# Patient Record
Sex: Male | Born: 1950 | Race: Black or African American | Hispanic: No | Marital: Married | Smoking: Never smoker
Health system: Southern US, Community
[De-identification: ages and names within clinical notes are randomized; demographics above are authoritative.]

## PROBLEM LIST (undated history)

## (undated) DIAGNOSIS — M1990 Unspecified osteoarthritis, unspecified site: Secondary | ICD-10-CM

## (undated) DIAGNOSIS — Z98811 Dental restoration status: Secondary | ICD-10-CM

## (undated) DIAGNOSIS — F329 Major depressive disorder, single episode, unspecified: Secondary | ICD-10-CM

## (undated) DIAGNOSIS — N400 Enlarged prostate without lower urinary tract symptoms: Secondary | ICD-10-CM

## (undated) DIAGNOSIS — N189 Chronic kidney disease, unspecified: Secondary | ICD-10-CM

## (undated) DIAGNOSIS — K4090 Unilateral inguinal hernia, without obstruction or gangrene, not specified as recurrent: Secondary | ICD-10-CM

## (undated) DIAGNOSIS — F32A Depression, unspecified: Secondary | ICD-10-CM

## (undated) DIAGNOSIS — I10 Essential (primary) hypertension: Secondary | ICD-10-CM

## (undated) HISTORY — PX: HERNIA REPAIR: SHX51

## (undated) HISTORY — DX: Chronic kidney disease, unspecified: N18.9

## (undated) HISTORY — PX: JOINT REPLACEMENT: SHX530

## (undated) HISTORY — PX: APPENDECTOMY: SHX54

---

## 1994-05-16 HISTORY — PX: ULNAR NERVE REPAIR: SHX2594

## 1999-05-17 HISTORY — PX: SHOULDER ARTHROSCOPY WITH ROTATOR CUFF REPAIR: SHX5685

## 1999-08-01 ENCOUNTER — Emergency Department (HOSPITAL_COMMUNITY): Admission: EM | Admit: 1999-08-01 | Discharge: 1999-08-01 | Payer: Self-pay | Admitting: *Deleted

## 1999-08-03 ENCOUNTER — Encounter: Admission: RE | Admit: 1999-08-03 | Discharge: 1999-08-03 | Payer: Self-pay | Admitting: Family Medicine

## 1999-08-03 ENCOUNTER — Encounter: Payer: Self-pay | Admitting: Family Medicine

## 1999-09-08 ENCOUNTER — Emergency Department (HOSPITAL_COMMUNITY): Admission: EM | Admit: 1999-09-08 | Discharge: 1999-09-09 | Payer: Self-pay | Admitting: Emergency Medicine

## 2000-05-02 ENCOUNTER — Encounter: Admission: RE | Admit: 2000-05-02 | Discharge: 2000-05-02 | Payer: Self-pay | Admitting: Orthopedic Surgery

## 2000-05-02 ENCOUNTER — Encounter: Payer: Self-pay | Admitting: Orthopedic Surgery

## 2000-07-03 ENCOUNTER — Encounter
Admission: RE | Admit: 2000-07-03 | Discharge: 2000-07-03 | Payer: Self-pay | Admitting: Physical Medicine and Rehabilitation

## 2000-07-03 ENCOUNTER — Encounter: Payer: Self-pay | Admitting: Physical Medicine and Rehabilitation

## 2000-08-15 ENCOUNTER — Observation Stay (HOSPITAL_COMMUNITY): Admission: RE | Admit: 2000-08-15 | Discharge: 2000-08-16 | Payer: Self-pay | Admitting: Orthopedic Surgery

## 2003-05-22 ENCOUNTER — Encounter: Admission: RE | Admit: 2003-05-22 | Discharge: 2003-05-22 | Payer: Self-pay | Admitting: Family Medicine

## 2003-06-09 ENCOUNTER — Encounter (INDEPENDENT_AMBULATORY_CARE_PROVIDER_SITE_OTHER): Payer: Self-pay | Admitting: Specialist

## 2003-06-09 ENCOUNTER — Ambulatory Visit (HOSPITAL_COMMUNITY): Admission: RE | Admit: 2003-06-09 | Discharge: 2003-06-09 | Payer: Self-pay | Admitting: Gastroenterology

## 2008-05-24 ENCOUNTER — Emergency Department (HOSPITAL_COMMUNITY): Admission: EM | Admit: 2008-05-24 | Discharge: 2008-05-24 | Payer: Self-pay | Admitting: Emergency Medicine

## 2010-08-30 LAB — CBC
Platelets: 215 10*3/uL (ref 150–400)
RDW: 13.4 % (ref 11.5–15.5)
WBC: 14.1 10*3/uL — ABNORMAL HIGH (ref 4.0–10.5)

## 2010-08-30 LAB — URINALYSIS, ROUTINE W REFLEX MICROSCOPIC
Glucose, UA: NEGATIVE mg/dL
Leukocytes, UA: NEGATIVE
Nitrite: NEGATIVE
Specific Gravity, Urine: 1.043 — ABNORMAL HIGH (ref 1.005–1.030)
pH: 6 (ref 5.0–8.0)

## 2010-08-30 LAB — COMPREHENSIVE METABOLIC PANEL
ALT: 30 U/L (ref 0–53)
BUN: 9 mg/dL (ref 6–23)
CO2: 25 mEq/L (ref 19–32)
Calcium: 9.1 mg/dL (ref 8.4–10.5)
Creatinine, Ser: 1.18 mg/dL (ref 0.4–1.5)
GFR calc Af Amer: >60 mL/min (ref >60)
GFR calc non Af Amer: >60 mL/min (ref >60)
Glucose, Bld: 97 mg/dL (ref 70–99)
Sodium: 137 mEq/L (ref 135–145)

## 2010-08-30 LAB — URINE MICROSCOPIC-ADD ON

## 2010-08-30 LAB — DIFFERENTIAL
Eosinophils Absolute: 0 10*3/uL (ref 0.0–0.7)
Lymphs Abs: 2.8 10*3/uL (ref 0.7–4.0)
Neutrophils Relative %: 68 % (ref 43–77)

## 2010-08-30 LAB — LIPASE, BLOOD: Lipase: 28 U/L (ref 11–59)

## 2010-10-01 NOTE — Op Note (Signed)
NAME:  MAURICO, PERRELL                          ACCOUNT NO.:  [id number]   MEDICAL RECORD NO.:  [id number]                   PATIENT TYPE:  AMB   LOCATION:  ENDO                                 FACILITY:  [facility]   PHYSICIAN:  Graylin Shiver, M.D.                DATE OF BIRTH:  06-27-1950   DATE OF PROCEDURE:  06/09/2003  DATE OF DISCHARGE:                                 OPERATIVE REPORT   PROCEDURE:  Colonoscopy with biopsy.   INDICATION:  Screening.   Informed consent was obtained after explanation of the risks of bleeding,  infection, and perforation   PREMEDICATIONS:  1. Fentanyl 75 mcg IV.  2. Versed 7 mg IV.   DESCRIPTION OF PROCEDURE:  With the patient in the left lateral decubitus  position, a rectal exam was performed; no masses were felt.  The Olympus  colonoscope was inserted into the rectum and advanced around the colon to  the cecum.  Cecal landmarks were identified.  The cecum and ascending colon  were normal.  The transverse colon was normal.  The descending colon was  normal.  The sigmoid showed some scattered diverticula.  The sigmoid also  showed a small 2 mm sessile polyp which was biopsied off with cold forceps.  The rectum was normal.  He tolerated the procedure well without  complications.   IMPRESSION:  1. Diverticulosis, diagnosis code 562.10.  2. Colon polyp, diagnosis code 211.3.                                               Graylin Shiver, M.D.    Germain Osgood  D:  06/09/2003  T:  06/09/2003  Job:  [id number]   cc:   L. Lupe Carney, M.D.  [address] [place]  [place] [address]  Fax: [phone]

## 2012-01-20 ENCOUNTER — Encounter (INDEPENDENT_AMBULATORY_CARE_PROVIDER_SITE_OTHER): Payer: Self-pay | Admitting: Surgery

## 2012-01-24 ENCOUNTER — Encounter (INDEPENDENT_AMBULATORY_CARE_PROVIDER_SITE_OTHER): Payer: Self-pay | Admitting: Surgery

## 2012-01-24 ENCOUNTER — Ambulatory Visit (INDEPENDENT_AMBULATORY_CARE_PROVIDER_SITE_OTHER): Payer: Commercial Indemnity | Admitting: Surgery

## 2012-01-24 VITALS — BP 139/87 | HR 84 | Temp 97.4°F | Resp 18 | Ht 69.0 in | Wt 187.2 lb

## 2012-01-24 DIAGNOSIS — K4090 Unilateral inguinal hernia, without obstruction or gangrene, not specified as recurrent: Secondary | ICD-10-CM | POA: Insufficient documentation

## 2012-01-24 NOTE — Progress Notes (Signed)
Patient ID: Nicholas Cooke, male   DOB: Oct 22, 1950, 61 y.o.   MRN: [id number]  Chief Complaint  Patient presents with  . Other    possible inguinal hernia    HPI Nicholas Cooke is a 61 y.o. male.   HPIthis is a very nice gentleman referred by Dr. Lupe Carney for evaluation of left groin pain and a possible hernia. The patient noticed discomfort in his groin with exercising. It has gotten much worse over time and he now has a reducible bulge in the left groin. The pain is sharp and severe but has not refer any where else. He has had no obstructive symptoms. He has never noticed a bulge in the right groin or umbilicus. He is otherwise without complaints  Past Medical History  Diagnosis Date  . Sympathetic reflex dystrophy     left foot  . Diverticulosis     Past Surgical History  Procedure Date  . Appendectomy   . Knee surgery   . Shoulder surgery     History reviewed. No pertinent family history.  Social History History  Substance Use Topics  . Smoking status: Never Smoker   . Smokeless tobacco: Not on file  . Alcohol Use: No    No Known Allergies  Current Outpatient Prescriptions  Medication Sig Dispense Refill  . finasteride (PROSCAR) 5 MG tablet Take 5 mg by mouth daily.      . fish oil-omega-3 fatty acids 1000 MG capsule Take 2 g by mouth daily.      . Multiple Vitamins-Minerals (VISION VITAMINS PO) Take by mouth.      . sildenafil (VIAGRA) 50 MG tablet Take 50 mg by mouth daily as needed.        Review of Systems Review of Systems  Constitutional: Negative for fever, chills and unexpected weight change.  HENT: Negative for hearing loss, congestion, sore throat, trouble swallowing and voice change.   Eyes: Negative for visual disturbance.  Respiratory: Negative for cough and wheezing.   Cardiovascular: Negative for chest pain, palpitations and leg swelling.  Gastrointestinal: Positive for abdominal pain. Negative for nausea, vomiting, diarrhea,  constipation, blood in stool, abdominal distention, anal bleeding and rectal pain.  Genitourinary: Negative for hematuria and difficulty urinating.  Musculoskeletal: Negative for arthralgias.  Skin: Negative for rash and wound.  Neurological: Negative for seizures, syncope, weakness and headaches.  Hematological: Negative for adenopathy. Does not bruise/bleed easily.  Psychiatric/Behavioral: Negative for confusion.    Blood pressure 139/87, pulse 84, temperature 97.4 F (36.3 C), temperature source Temporal, resp. rate 18, height 5\' 9"  (1.753 m), weight 187 lb 3.2 oz (84.913 kg).  Physical Exam Physical Exam  Constitutional: He is oriented to person, place, and time. He appears well-developed and well-nourished. No distress.  HENT:  Head: Normocephalic and atraumatic.  Right Ear: External ear normal.  Left Ear: External ear normal.  Nose: Nose normal.  Mouth/Throat: Oropharynx is clear and moist. No oropharyngeal exudate.  Eyes: Conjunctivae are normal. Pupils are equal, round, and reactive to light. Right eye exhibits no discharge. Left eye exhibits no discharge. No scleral icterus.  Neck: Normal range of motion. Neck supple. No tracheal deviation present. No thyromegaly present.  Cardiovascular: Normal rate, regular rhythm, normal heart sounds and intact distal pulses.   No murmur heard. Pulmonary/Chest: Effort normal and breath sounds normal. No respiratory distress. He has no wheezes. He has no rales.  Abdominal: Soft. Bowel sounds are normal. There is tenderness.       There is a reducible  left inguinal hernia. This is tender on exam. There is no evidence of right inguinal hernia or umbilical hernia  Musculoskeletal: Normal range of motion. He exhibits no edema and no tenderness.       There is a large scar on his right leg from previous knee surgery  Lymphadenopathy:    He has no cervical adenopathy.  Neurological: He is alert and oriented to person, place, and time.  Skin:  Skin is warm and dry. No rash noted. He is not diaphoretic. No erythema.  Psychiatric: His behavior is normal. Judgment normal.    Data Reviewed   Assessment    Left inguinal hernia    Plan    I discussed the diagnosis with him. I discussed repair with mesh. I discussed with the laparoscopic and open techniques. I discussed the risks of surgery which include but not limited to bleeding, infection, injury to any structures, nerve entrapment, chronic pain, recurrence, etc. He understands and wishes to proceed with open repair which will be scheduled. Likelihood of success is good       Jazzmyne Rasnick A 01/24/2012, 10:22 AM

## 2012-02-13 ENCOUNTER — Encounter (HOSPITAL_BASED_OUTPATIENT_CLINIC_OR_DEPARTMENT_OTHER): Payer: Self-pay | Admitting: *Deleted

## 2012-02-14 MED ORDER — THROMBIN 20000 UNITS EX SOLR
CUTANEOUS | Status: AC
  Filled 2012-02-14: qty 20000

## 2012-02-14 MED ORDER — LIDOCAINE HCL (PF) 1 % IJ SOLN
INTRAMUSCULAR | Status: AC
  Filled 2012-02-14: qty 30

## 2012-02-15 NOTE — H&P (Signed)
Patient ID: Nicholas Cooke, male DOB: [birth year], 61 y.o. MRN: [id number]  Chief Complaint   Patient presents with   .  Other     possible inguinal hernia    HPI  Nicholas Cooke is a 61 y.o. male.  HPIthis is a very nice gentleman referred by Dr. Lupe Carney for evaluation of left groin pain and a possible hernia. The patient noticed discomfort in his groin with exercising. It has gotten much worse over time and he now has a reducible bulge in the left groin. The pain is sharp and severe but has not refer any where else. He has had no obstructive symptoms. He has never noticed a bulge in the right groin or umbilicus. He is otherwise without complaints  Past Medical History   Diagnosis  Date   .  Sympathetic reflex dystrophy      left foot   .  Diverticulosis     Past Surgical History   Procedure  Date   .  Appendectomy    .  Knee surgery    .  Shoulder surgery     History reviewed. No pertinent family history.  Social History  History   Substance Use Topics   .  Smoking status:  Never Smoker   .  Smokeless tobacco:  Not on file   .  Alcohol Use:  No    No Known Allergies  Current Outpatient Prescriptions   Medication  Sig  Dispense  Refill   .  finasteride (PROSCAR) 5 MG tablet  Take 5 mg by mouth daily.     .  fish oil-omega-3 fatty acids 1000 MG capsule  Take 2 g by mouth daily.     .  Multiple Vitamins-Minerals (VISION VITAMINS PO)  Take by mouth.     .  sildenafil (VIAGRA) 50 MG tablet  Take 50 mg by mouth daily as needed.      Review of Systems  Review of Systems  Constitutional: Negative for fever, chills and unexpected weight change.  HENT: Negative for hearing loss, congestion, sore throat, trouble swallowing and voice change.  Eyes: Negative for visual disturbance.  Respiratory: Negative for cough and wheezing.  Cardiovascular: Negative for chest pain, palpitations and leg swelling.  Gastrointestinal: Positive for abdominal pain. Negative for nausea,  vomiting, diarrhea, constipation, blood in stool, abdominal distention, anal bleeding and rectal pain.  Genitourinary: Negative for hematuria and difficulty urinating.  Musculoskeletal: Negative for arthralgias.  Skin: Negative for rash and wound.  Neurological: Negative for seizures, syncope, weakness and headaches.  Hematological: Negative for adenopathy. Does not bruise/bleed easily.  Psychiatric/Behavioral: Negative for confusion.   Blood pressure 139/87, pulse 84, temperature 97.4 F (36.3 C), temperature source Temporal, resp. rate 18, height 5\' 9"  (1.753 m), weight 187 lb 3.2 oz (84.913 kg).  Physical Exam  Physical Exam  Constitutional: He is oriented to person, place, and time. He appears well-developed and well-nourished. No distress.  HENT:  Head: Normocephalic and atraumatic.  Right Ear: External ear normal.  Left Ear: External ear normal.  Nose: Nose normal.  Mouth/Throat: Oropharynx is clear and moist. No oropharyngeal exudate.  Eyes: Conjunctivae are normal. Pupils are equal, round, and reactive to light. Right eye exhibits no discharge. Left eye exhibits no discharge. No scleral icterus.  Neck: Normal range of motion. Neck supple. No tracheal deviation present. No thyromegaly present.  Cardiovascular: Normal rate, regular rhythm, normal heart sounds and intact distal pulses.  No murmur heard.  Pulmonary/Chest: Effort normal and breath sounds  normal. No respiratory distress. He has no wheezes. He has no rales.  Abdominal: Soft. Bowel sounds are normal. There is tenderness.  There is a reducible left inguinal hernia. This is tender on exam. There is no evidence of right inguinal hernia or umbilical hernia  Musculoskeletal: Normal range of motion. He exhibits no edema and no tenderness.  There is a large scar on his right leg from previous knee surgery  Lymphadenopathy:  He has no cervical adenopathy.  Neurological: He is alert and oriented to person, place, and time.    Skin: Skin is warm and dry. No rash noted. He is not diaphoretic. No erythema.  Psychiatric: His behavior is normal. Judgment normal.   Data Reviewed  Assessment   Left inguinal hernia   Plan   I discussed the diagnosis with him. I discussed repair with mesh. I discussed with the laparoscopic and open techniques. I discussed the risks of surgery which include but not limited to bleeding, infection, injury to any structures, nerve entrapment, chronic pain, recurrence, etc. He understands and wishes to proceed with open repair which will be scheduled. Likelihood of success is good   Avantae Bither A

## 2012-02-16 ENCOUNTER — Encounter (HOSPITAL_BASED_OUTPATIENT_CLINIC_OR_DEPARTMENT_OTHER): Payer: Self-pay | Admitting: Anesthesiology

## 2012-02-16 ENCOUNTER — Ambulatory Visit (HOSPITAL_BASED_OUTPATIENT_CLINIC_OR_DEPARTMENT_OTHER)
Admission: RE | Admit: 2012-02-16 | Discharge: 2012-02-16 | Disposition: A | Discharge status: Home / Self Care | Payer: Commercial Indemnity | Source: Ambulatory Visit | Attending: Surgery | Admitting: Surgery

## 2012-02-16 ENCOUNTER — Encounter (HOSPITAL_BASED_OUTPATIENT_CLINIC_OR_DEPARTMENT_OTHER)
Admission: RE | Disposition: A | Discharge status: Home / Self Care | Payer: Self-pay | Source: Ambulatory Visit | Attending: Surgery

## 2012-02-16 ENCOUNTER — Encounter (HOSPITAL_BASED_OUTPATIENT_CLINIC_OR_DEPARTMENT_OTHER): Payer: Self-pay | Admitting: *Deleted

## 2012-02-16 ENCOUNTER — Ambulatory Visit (HOSPITAL_BASED_OUTPATIENT_CLINIC_OR_DEPARTMENT_OTHER): Payer: Commercial Indemnity | Admitting: Anesthesiology

## 2012-02-16 DIAGNOSIS — K4090 Unilateral inguinal hernia, without obstruction or gangrene, not specified as recurrent: Secondary | ICD-10-CM

## 2012-02-16 HISTORY — PX: INGUINAL HERNIA REPAIR: SHX194

## 2012-02-16 SURGERY — REPAIR, HERNIA, INGUINAL, ADULT
Anesthesia: General | Site: Groin | Laterality: Left | Wound class: Clean

## 2012-02-16 MED ORDER — ONDANSETRON HCL 4 MG/2ML IJ SOLN: 4.0000 mg | Freq: Four times a day (QID) | INTRAMUSCULAR | Status: DC | PRN

## 2012-02-16 MED ORDER — SODIUM CHLORIDE 0.9 % IJ SOLN: 3.0000 mL | INTRAMUSCULAR | Status: DC | PRN

## 2012-02-16 MED ORDER — EPHEDRINE SULFATE 50 MG/ML IJ SOLN
INTRAMUSCULAR | Status: DC | PRN
  Administered 2012-02-16: 15 mg via INTRAVENOUS

## 2012-02-16 MED ORDER — MIDAZOLAM HCL 2 MG/2ML IJ SOLN
0.5000 mg | INTRAMUSCULAR | Status: DC | PRN
  Administered 2012-02-16: 2 mg via INTRAVENOUS

## 2012-02-16 MED ORDER — OXYCODONE HCL 5 MG PO TABS: 5.0000 mg | ORAL_TABLET | ORAL | Status: DC | PRN

## 2012-02-16 MED ORDER — BUPIVACAINE-EPINEPHRINE PF 0.5-1:200000 % IJ SOLN
INTRAMUSCULAR | Status: DC | PRN
  Administered 2012-02-16: 30 mL

## 2012-02-16 MED ORDER — PROPOFOL 10 MG/ML IV BOLUS
INTRAVENOUS | Status: DC | PRN
  Administered 2012-02-16: 200 mg via INTRAVENOUS

## 2012-02-16 MED ORDER — OXYCODONE HCL 5 MG PO TABS
5.0000 mg | ORAL_TABLET | Freq: Once | ORAL | Status: AC | PRN
  Administered 2012-02-16: 5 mg via ORAL

## 2012-02-16 MED ORDER — SODIUM CHLORIDE 0.9 % IJ SOLN: 3.0000 mL | Freq: Two times a day (BID) | INTRAMUSCULAR | Status: DC

## 2012-02-16 MED ORDER — LACTATED RINGERS IV SOLN
INTRAVENOUS | Status: DC
  Administered 2012-02-16: 14:00:00 via INTRAVENOUS

## 2012-02-16 MED ORDER — ACETAMINOPHEN 10 MG/ML IV SOLN
1000.0000 mg | Freq: Once | INTRAVENOUS | Status: AC
  Administered 2012-02-16: 1000 mg via INTRAVENOUS

## 2012-02-16 MED ORDER — BUPIVACAINE-EPINEPHRINE 0.5% -1:200000 IJ SOLN
INTRAMUSCULAR | Status: DC | PRN
  Administered 2012-02-16: 30 mL

## 2012-02-16 MED ORDER — FENTANYL CITRATE 0.05 MG/ML IJ SOLN
INTRAMUSCULAR | Status: DC | PRN
  Administered 2012-02-16: 100 ug via INTRAVENOUS

## 2012-02-16 MED ORDER — ONDANSETRON HCL 4 MG/2ML IJ SOLN
INTRAMUSCULAR | Status: DC | PRN
  Administered 2012-02-16: 4 mg via INTRAVENOUS

## 2012-02-16 MED ORDER — FENTANYL CITRATE 0.05 MG/ML IJ SOLN
50.0000 ug | INTRAMUSCULAR | Status: DC | PRN
  Administered 2012-02-16: 100 ug via INTRAVENOUS

## 2012-02-16 MED ORDER — MORPHINE SULFATE 4 MG/ML IJ SOLN: 4.0000 mg | INTRAMUSCULAR | Status: DC | PRN

## 2012-02-16 MED ORDER — OXYCODONE HCL 5 MG/5ML PO SOLN: 5.0000 mg | Freq: Once | ORAL | Status: AC | PRN

## 2012-02-16 MED ORDER — LIDOCAINE HCL (CARDIAC) 20 MG/ML IV SOLN
INTRAVENOUS | Status: DC | PRN
  Administered 2012-02-16: 100 mg via INTRAVENOUS

## 2012-02-16 MED ORDER — SODIUM CHLORIDE 0.9 % IV SOLN: 250.0000 mL | INTRAVENOUS | Status: DC | PRN

## 2012-02-16 MED ORDER — ACETAMINOPHEN 325 MG PO TABS: 650.0000 mg | ORAL_TABLET | ORAL | Status: DC | PRN

## 2012-02-16 MED ORDER — BUPIVACAINE HCL (PF) 0.25 % IJ SOLN: INTRAMUSCULAR | Status: DC | PRN

## 2012-02-16 MED ORDER — TRAMADOL HCL 50 MG PO TABS: 50.0000 mg | ORAL_TABLET | ORAL | Status: DC | PRN

## 2012-02-16 MED ORDER — HYDROMORPHONE HCL PF 1 MG/ML IJ SOLN: 0.2500 mg | INTRAMUSCULAR | Status: DC | PRN

## 2012-02-16 MED ORDER — CEFAZOLIN SODIUM-DEXTROSE 2-3 GM-% IV SOLR
2.0000 g | INTRAVENOUS | Status: AC
  Administered 2012-02-16: 2 g via INTRAVENOUS

## 2012-02-16 MED ORDER — ACETAMINOPHEN 650 MG RE SUPP: 650.0000 mg | RECTAL | Status: DC | PRN

## 2012-02-16 MED ORDER — DEXAMETHASONE SODIUM PHOSPHATE 4 MG/ML IJ SOLN
INTRAMUSCULAR | Status: DC | PRN
  Administered 2012-02-16: 10 mg via INTRAVENOUS

## 2012-02-16 SURGICAL SUPPLY — 47 items
APL SKNCLS STERI-STRIP NONHPOA (GAUZE/BANDAGES/DRESSINGS) ×2
BENZOIN TINCTURE PRP APPL 2/3 (GAUZE/BANDAGES/DRESSINGS) ×3 IMPLANT
BLADE HEX COATED 2.75 (ELECTRODE) ×3 IMPLANT
BLADE SURG 10 STRL SS (BLADE) ×3 IMPLANT
BLADE SURG ROTATE 9660 (MISCELLANEOUS) ×3 IMPLANT
CHLORAPREP W/TINT 26ML (MISCELLANEOUS) ×3 IMPLANT
CLOTH BEACON ORANGE TIMEOUT ST (SAFETY) ×3 IMPLANT
COVER MAYO STAND STRL (DRAPES) ×3 IMPLANT
COVER TABLE BACK 60X90 (DRAPES) ×3 IMPLANT
DECANTER SPIKE VIAL GLASS SM (MISCELLANEOUS) ×3 IMPLANT
DRAIN PENROSE 1/2X12 LTX STRL (WOUND CARE) ×3 IMPLANT
DRAPE PED LAPAROTOMY (DRAPES) ×3 IMPLANT
DRAPE UTILITY XL STRL (DRAPES) ×3 IMPLANT
DRSG TEGADERM 4X4.75 (GAUZE/BANDAGES/DRESSINGS) ×3 IMPLANT
ELECT REM PT RETURN 9FT ADLT (ELECTROSURGICAL) ×3
ELECTRODE REM PT RTRN 9FT ADLT (ELECTROSURGICAL) ×2 IMPLANT
GAUZE SPONGE 4X4 12PLY STRL LF (GAUZE/BANDAGES/DRESSINGS) ×3 IMPLANT
GLOVE BIOGEL PI IND STRL 7.0 (GLOVE) IMPLANT
GLOVE BIOGEL PI INDICATOR 7.0 (GLOVE) ×2
GLOVE ECLIPSE 6.5 STRL STRAW (GLOVE) ×1 IMPLANT
GLOVE SURG SIGNA 7.5 PF LTX (GLOVE) ×3 IMPLANT
GOWN PREVENTION PLUS XLARGE (GOWN DISPOSABLE) ×4 IMPLANT
GOWN PREVENTION PLUS XXLARGE (GOWN DISPOSABLE) ×3 IMPLANT
MESH PARIETEX PROGRIP LEFT (Mesh General) ×1 IMPLANT
NDL HYPO 25X1 1.5 SAFETY (NEEDLE) ×2 IMPLANT
NEEDLE HYPO 22GX1.5 SAFETY (NEEDLE) ×3 IMPLANT
NEEDLE HYPO 25X1 1.5 SAFETY (NEEDLE) ×3 IMPLANT
NS IRRIG 1000ML POUR BTL (IV SOLUTION) IMPLANT
PACK BASIN DAY SURGERY FS (CUSTOM PROCEDURE TRAY) ×3 IMPLANT
PENCIL BUTTON HOLSTER BLD 10FT (ELECTRODE) ×3 IMPLANT
SLEEVE SCD COMPRESS KNEE MED (MISCELLANEOUS) ×1 IMPLANT
SPONGE INTESTINAL PEANUT (DISPOSABLE) ×1 IMPLANT
SPONGE LAP 4X18 X RAY DECT (DISPOSABLE) ×3 IMPLANT
STRIP CLOSURE SKIN 1/2X4 (GAUZE/BANDAGES/DRESSINGS) ×3 IMPLANT
SUT MNCRL AB 4-0 PS2 18 (SUTURE) ×3 IMPLANT
SUT SILK 2 0 SH (SUTURE) ×1 IMPLANT
SUT SURG 0 T 19/GS 22 1969 62 (SUTURE) IMPLANT
SUT VIC AB 2-0 CT1 27 (SUTURE) ×3
SUT VIC AB 2-0 CT1 TAPERPNT 27 (SUTURE) ×2 IMPLANT
SUT VIC AB 3-0 CT1 27 (SUTURE) ×3
SUT VIC AB 3-0 CT1 27XBRD (SUTURE) ×2 IMPLANT
SUT VICRYL AB 3 0 TIES (SUTURE) ×3 IMPLANT
SYR BULB 3OZ (MISCELLANEOUS) ×1 IMPLANT
SYR CONTROL 10ML LL (SYRINGE) ×3 IMPLANT
TOWEL OR 17X24 6PK STRL BLUE (TOWEL DISPOSABLE) ×6 IMPLANT
TOWEL OR NON WOVEN STRL DISP B (DISPOSABLE) ×3 IMPLANT
WATER STERILE IRR 1000ML POUR (IV SOLUTION) ×3 IMPLANT

## 2012-02-16 NOTE — Anesthesia Postprocedure Evaluation (Signed)
  Anesthesia Post-op Note  Patient: Nicholas Cooke  Procedure(s) Performed: Procedure(s) (LRB) with comments: HERNIA REPAIR INGUINAL ADULT (Left) - left inguinal hernia repair with mesh INSERTION OF MESH (Left)  Patient Location: PACU  Anesthesia Type: GA combined with regional for post-op pain  Level of Consciousness: awake and alert   Airway and Oxygen Therapy: Patient Spontanous Breathing  Post-op Pain: none  Post-op Assessment: Post-op Vital signs reviewed, Patient's Cardiovascular Status Stable, Respiratory Function Stable, Patent Airway and No signs of Nausea or vomiting  Post-op Vital Signs: Reviewed and stable  Complications: No apparent anesthesia complications

## 2012-02-16 NOTE — Anesthesia Preprocedure Evaluation (Signed)
Anesthesia Evaluation  Patient identified by MRN, date of birth, ID band Patient awake    Reviewed: Allergy & Precautions, H&P , NPO status , Patient's Chart, lab work & pertinent test results  Airway Mallampati: I TM Distance: >3 FB Neck ROM: Full    Dental No notable dental hx. (+) Teeth Intact and Dental Advisory Given   Pulmonary neg pulmonary ROS,  breath sounds clear to auscultation  Pulmonary exam normal       Cardiovascular negative cardio ROS  Rhythm:Regular Rate:Normal     Neuro/Psych negative neurological ROS  negative psych ROS   GI/Hepatic negative GI ROS, Neg liver ROS,   Endo/Other  negative endocrine ROS  Renal/GU negative Renal ROS  negative genitourinary   Musculoskeletal   Abdominal   Peds  Hematology negative hematology ROS (+)   Anesthesia Other Findings   Reproductive/Obstetrics negative OB ROS                           Anesthesia Physical Anesthesia Plan  ASA: I  Anesthesia Plan: General   Post-op Pain Management: MAC Combined w/ Regional for Post-op pain   Induction: Intravenous  Airway Management Planned: LMA  Additional Equipment:   Intra-op Plan:   Post-operative Plan: Extubation in OR  Informed Consent: I have reviewed the patients History and Physical, chart, labs and discussed the procedure including the risks, benefits and alternatives for the proposed anesthesia with the patient or authorized representative who has indicated his/her understanding and acceptance.   Dental advisory given  Plan Discussed with: CRNA  Anesthesia Plan Comments:         Anesthesia Quick Evaluation

## 2012-02-16 NOTE — Progress Notes (Signed)
Assisted Dr. Fitzgerald with left, ultrasound guided, transabdominal plane block. Side rails up, monitors on throughout procedure. See vital signs in flow sheet. Tolerated Procedure well. 

## 2012-02-16 NOTE — Interval H&P Note (Signed)
History and Physical Interval Note:  No change in H and P  02/16/2012 1:42 PM  Nicholas Cooke  has presented today for surgery, with the diagnosis of left inguinal hernia  The various methods of treatment have been discussed with the patient and family. After consideration of risks, benefits and other options for treatment, the patient has consented to  Procedure(s) (LRB) with comments: HERNIA REPAIR INGUINAL ADULT (Left) - left inguinal hernia repair with mesh INSERTION OF MESH (Left) as a surgical intervention .  The patient's history has been reviewed, patient examined, no change in status, stable for surgery.  I have reviewed the patient's chart and labs.  Questions were answered to the patient's satisfaction.     [name] A

## 2012-02-16 NOTE — Anesthesia Procedure Notes (Addendum)
Procedure Name: LMA Insertion Date/Time: 02/16/2012 2:29 PM Performed by: Verlan Friends Pre-anesthesia Checklist: Patient identified, Emergency Drugs available, Suction available, Patient being monitored and Timeout performed Patient Re-evaluated:Patient Re-evaluated prior to inductionOxygen Delivery Method: Circle System Utilized Preoxygenation: Pre-oxygenation with 100% oxygen Intubation Type: IV induction Ventilation: Mask ventilation without difficulty LMA: LMA inserted LMA Size: 5.0 Number of attempts: 1 Airway Equipment and Method: bite block Placement Confirmation: positive ETCO2 Tube secured with: Tape Dental Injury: Teeth and Oropharynx as per pre-operative assessment    Anesthesia Regional Block:  TAP block  Pre-Anesthetic Checklist: ,, timeout performed, Correct Patient, Correct Site, Correct Laterality, Correct Procedure, Correct Position, site marked, Risks and benefits discussed, pre-op evaluation, post-op pain management  Laterality: Left  Prep: chloraprep       Needles:   Needle Type: Echogenic Stimulator Needle      Needle Gauge: 21 G    Additional Needles:  Procedures: ultrasound guided TAP block Narrative:  Start time: 02/16/2012 1:56 PM End time: 02/16/2012 2:11 PM Injection made incrementally with aspirations every 5 mL. Anesthesiologist: Chrystina Naff,MD

## 2012-02-16 NOTE — Op Note (Signed)
HERNIA REPAIR INGUINAL ADULT, INSERTION OF MESH  Procedure Note  Nicholas Cooke 02/16/2012   Pre-op Diagnosis: left inguinal hernia     Post-op Diagnosis: same  Procedure(s): LEFT INGUINAL HERNIA REPAIR WITH MESH (PROGRIP MESH)  Surgeon(s): Shelly Rubenstein, MD  Anesthesia: General  Staff:  Randalyn Rhea, RN - Scrub Person Amy Adela Lank, RN - Circulator  Estimated Blood Loss: Minimal               Procedure: The patient was brought to the operating room and identified as the correct patient. He was placed supine on the operating room table and general anesthesia was induced. His left lower quadrant and groin were then prepped and draped in the usual sterile fashion. I anesthetized the skin with Marcaine. I then made a longitudinal incision with the scalpel. I took this down through Scarpa's fascia with the electrocautery. The external oblique fascia was then opened toward the internal and external rings. The testicular cord structures identified and controlled with a Penrose drain. I separated an indirect hernia sac from the cord structures. There was omentum in the sac which I reduced back into the abdominal cavity. I tied off the base of the sac with a 2-0 silk suture. I then excised the redundant sac. A piece of Prolene Progrip mesh was brought to the field. I placed around the cord structures and secured it to the inguinal floor. I then sutured it to the pubic tubercle with a 2-0 Vicryl suture. Good coverage of the inguinal floor appeared to be achieved. I then closed the external oblique fascia over the top of this the running 2-0 Vicryl suture. I anesthetized the fascia further with Marcaine. I closed Scarpa's fascia with interrupted 3-0 Vicryl sutures and closed the skin with a running 4-0 Monocryl. Steri-Strips, gauze, and Tegaderm were then applied. The patient tolerated the procedure well. All the counts were correct at the end of the procedure. The patient was then  extubated in the operating room and taken in a stable condition to the recovery room.          [name]   Date: 02/16/2012  Time: 2:58 PM

## 2012-02-16 NOTE — Transfer of Care (Signed)
Immediate Anesthesia Transfer of Care Note  Patient: Nicholas Cooke  Procedure(s) Performed: Procedure(s) (LRB) with comments: HERNIA REPAIR INGUINAL ADULT (Left) - left inguinal hernia repair with mesh INSERTION OF MESH (Left)  Patient Location: PACU  Anesthesia Type: General  Level of Consciousness: awake  Airway & Oxygen Therapy: Patient Spontanous Breathing and Patient connected to face mask oxygen  Post-op Assessment: Report given to PACU RN and Post -op Vital signs reviewed and stable  Post vital signs: Reviewed and stable  Complications: No apparent anesthesia complications

## 2012-02-17 ENCOUNTER — Encounter (HOSPITAL_BASED_OUTPATIENT_CLINIC_OR_DEPARTMENT_OTHER): Payer: Self-pay | Admitting: Surgery

## 2012-03-06 ENCOUNTER — Encounter (INDEPENDENT_AMBULATORY_CARE_PROVIDER_SITE_OTHER): Payer: Self-pay | Admitting: General Surgery

## 2012-03-06 ENCOUNTER — Encounter (INDEPENDENT_AMBULATORY_CARE_PROVIDER_SITE_OTHER): Payer: Self-pay | Admitting: Surgery

## 2012-03-06 ENCOUNTER — Encounter (INDEPENDENT_AMBULATORY_CARE_PROVIDER_SITE_OTHER): Payer: Self-pay

## 2012-03-06 ENCOUNTER — Ambulatory Visit (INDEPENDENT_AMBULATORY_CARE_PROVIDER_SITE_OTHER): Payer: Commercial Indemnity | Admitting: Surgery

## 2012-03-06 ENCOUNTER — Telehealth (INDEPENDENT_AMBULATORY_CARE_PROVIDER_SITE_OTHER): Payer: Self-pay

## 2012-03-06 VITALS — BP 136/80 | HR 78 | Temp 98.3°F | Resp 18 | Ht 69.0 in | Wt 187.6 lb

## 2012-03-06 DIAGNOSIS — Z09 Encounter for follow-up examination after completed treatment for conditions other than malignant neoplasm: Secondary | ICD-10-CM

## 2012-03-06 NOTE — Progress Notes (Signed)
Subjective:     Patient ID: Nicholas Cooke, male   DOB: 1951-05-15, 61 y.o.   MRN: [id number]  HPI He is here for a postop visit status post left inguinal hernia repair with mesh. He is doing well and has minimal postoperative discomfort  Review of Systems     Objective:   Physical Exam On exam, his incision is healing well and there is no evidence of recurrence    Assessment:     Patient stable status post left inguinal hernia repair with mesh    Plan:     He may return to work in full activities on November 11. I will see him back as needed

## 2012-03-06 NOTE — Telephone Encounter (Signed)
Pt called stating he miss quoted the rtw date at his recent office visit with Dr Magnus Ivan. Pt states his rtw date for 6wks is 04-02-12. Pt requests new rtw note indicating this rtw date. Rtw note done and pt will pick it up 03-07-12.

## 2013-06-13 ENCOUNTER — Emergency Department (HOSPITAL_COMMUNITY)
Admission: EM | Admit: 2013-06-13 | Discharge: 2013-06-13 | Disposition: A | Discharge status: Home / Self Care | Payer: 59 | Attending: Emergency Medicine | Admitting: Emergency Medicine

## 2013-06-13 ENCOUNTER — Encounter (HOSPITAL_COMMUNITY): Payer: Self-pay | Admitting: Emergency Medicine

## 2013-06-13 ENCOUNTER — Emergency Department (HOSPITAL_COMMUNITY): Payer: 59

## 2013-06-13 DIAGNOSIS — R197 Diarrhea, unspecified: Secondary | ICD-10-CM | POA: Insufficient documentation

## 2013-06-13 DIAGNOSIS — Z9089 Acquired absence of other organs: Secondary | ICD-10-CM | POA: Insufficient documentation

## 2013-06-13 DIAGNOSIS — G8190 Hemiplegia, unspecified affecting unspecified side: Secondary | ICD-10-CM

## 2013-06-13 DIAGNOSIS — R61 Generalized hyperhidrosis: Secondary | ICD-10-CM | POA: Insufficient documentation

## 2013-06-13 DIAGNOSIS — R1110 Vomiting, unspecified: Secondary | ICD-10-CM | POA: Insufficient documentation

## 2013-06-13 DIAGNOSIS — R55 Syncope and collapse: Secondary | ICD-10-CM | POA: Insufficient documentation

## 2013-06-13 DIAGNOSIS — Z8719 Personal history of other diseases of the digestive system: Secondary | ICD-10-CM | POA: Insufficient documentation

## 2013-06-13 DIAGNOSIS — R209 Unspecified disturbances of skin sensation: Secondary | ICD-10-CM | POA: Insufficient documentation

## 2013-06-13 DIAGNOSIS — Z79899 Other long term (current) drug therapy: Secondary | ICD-10-CM | POA: Insufficient documentation

## 2013-06-13 LAB — COMPREHENSIVE METABOLIC PANEL
ALK PHOS: 77 U/L (ref 39–117)
ALT: 22 U/L (ref 0–53)
AST: 25 U/L (ref 0–37)
Albumin: 4.1 g/dL (ref 3.5–5.2)
BUN: 16 mg/dL (ref 6–23)
CO2: 21 mEq/L (ref 19–32)
Calcium: 9.4 mg/dL (ref 8.4–10.5)
Chloride: 103 mEq/L (ref 96–112)
Creatinine, Ser: 1.41 mg/dL — ABNORMAL HIGH (ref 0.50–1.35)
GFR calc Af Amer: 61 mL/min — ABNORMAL LOW (ref >90)
GFR calc non Af Amer: 53 mL/min — ABNORMAL LOW (ref >90)
Glucose, Bld: 135 mg/dL — ABNORMAL HIGH (ref 70–99)
POTASSIUM: 4.8 meq/L (ref 3.7–5.3)
SODIUM: 138 meq/L (ref 137–147)
TOTAL PROTEIN: 8.5 g/dL — AB (ref 6.0–8.3)
Total Bilirubin: 1.8 mg/dL — ABNORMAL HIGH (ref 0.3–1.2)

## 2013-06-13 LAB — DIFFERENTIAL
BASOS PCT: 0 % (ref 0–1)
Basophils Absolute: 0 10*3/uL (ref 0.0–0.1)
Eosinophils Absolute: 0.1 10*3/uL (ref 0.0–0.7)
Eosinophils Relative: 0 % (ref 0–5)
Lymphocytes Relative: 20 % (ref 12–46)
Lymphs Abs: 2.3 10*3/uL (ref 0.7–4.0)
Monocytes Absolute: 0.9 10*3/uL (ref 0.1–1.0)
Monocytes Relative: 8 % (ref 3–12)
NEUTROS PCT: 72 % (ref 43–77)
Neutro Abs: 8.2 10*3/uL — ABNORMAL HIGH (ref 1.7–7.7)

## 2013-06-13 LAB — URINE MICROSCOPIC-ADD ON

## 2013-06-13 LAB — URINALYSIS, ROUTINE W REFLEX MICROSCOPIC
Bilirubin Urine: NEGATIVE
GLUCOSE, UA: NEGATIVE mg/dL
Hgb urine dipstick: NEGATIVE
KETONES UR: NEGATIVE mg/dL
Leukocytes, UA: NEGATIVE
Nitrite: NEGATIVE
PROTEIN: 30 mg/dL — AB
Specific Gravity, Urine: 1.022 (ref 1.005–1.030)
Urobilinogen, UA: 0.2 mg/dL (ref 0.0–1.0)
pH: 6 (ref 5.0–8.0)

## 2013-06-13 LAB — POCT I-STAT, CHEM 8
BUN: 18 mg/dL (ref 6–23)
CALCIUM ION: 1.14 mmol/L (ref 1.13–1.30)
CREATININE: 1.5 mg/dL — AB (ref 0.50–1.35)
Chloride: 107 mEq/L (ref 96–112)
Glucose, Bld: 142 mg/dL — ABNORMAL HIGH (ref 70–99)
HCT: 51 % (ref 39.0–52.0)
Hemoglobin: 17.3 g/dL — ABNORMAL HIGH (ref 13.0–17.0)
Potassium: 4.7 mEq/L (ref 3.7–5.3)
Sodium: 138 mEq/L (ref 137–147)
TCO2: 22 mmol/L (ref 0–100)

## 2013-06-13 LAB — RAPID URINE DRUG SCREEN, HOSP PERFORMED
AMPHETAMINES: NOT DETECTED
BENZODIAZEPINES: NOT DETECTED
Barbiturates: NOT DETECTED
Cocaine: NOT DETECTED
Opiates: NOT DETECTED
Tetrahydrocannabinol: NOT DETECTED

## 2013-06-13 LAB — CBC
HCT: 45.7 % (ref 39.0–52.0)
Hemoglobin: 16.1 g/dL (ref 13.0–17.0)
MCH: 29.3 pg (ref 26.0–34.0)
MCHC: 35.2 g/dL (ref 30.0–36.0)
MCV: 83.2 fL (ref 78.0–100.0)
PLATELETS: 220 10*3/uL (ref 150–400)
RBC: 5.49 MIL/uL (ref 4.22–5.81)
RDW: 13.6 % (ref 11.5–15.5)
WBC: 11.5 10*3/uL — ABNORMAL HIGH (ref 4.0–10.5)

## 2013-06-13 LAB — PROTIME-INR
INR: 1.06 (ref 0.00–1.49)
PROTHROMBIN TIME: 13.6 s (ref 11.6–15.2)

## 2013-06-13 LAB — GLUCOSE, CAPILLARY: Glucose-Capillary: 140 mg/dL — ABNORMAL HIGH (ref 70–99)

## 2013-06-13 LAB — APTT: aPTT: 24 seconds (ref 24–37)

## 2013-06-13 LAB — POCT I-STAT TROPONIN I: Troponin i, poc: 0 ng/mL (ref 0.00–0.08)

## 2013-06-13 LAB — ETHANOL: Alcohol, Ethyl (B): <11 mg/dL (ref 0–11)

## 2013-06-13 LAB — TROPONIN I

## 2013-06-13 MED ORDER — DIPHENOXYLATE-ATROPINE 2.5-0.025 MG PO TABS: 1.0000 | ORAL_TABLET | Freq: Four times a day (QID) | ORAL | Status: DC | PRN

## 2013-06-13 MED ORDER — NAPROXEN 500 MG PO TABS: 500.0000 mg | ORAL_TABLET | Freq: Two times a day (BID) | ORAL | Status: DC

## 2013-06-13 NOTE — ED Notes (Signed)
Chem 8 and troponin results given to Dr. Hyacinth MeekerMiller by B. Bing PlumeHaynes, EMT

## 2013-06-13 NOTE — ED Notes (Signed)
Pt arrived to MRI.  

## 2013-06-13 NOTE — ED Notes (Signed)
Pt has significant increase in strength in left arm and leg. Some mild deficit still noted compared to right arm and leg

## 2013-06-13 NOTE — ED Notes (Signed)
Pt ambulated to restroom unassisted with a mildly unsteady gait

## 2013-06-13 NOTE — ED Notes (Signed)
Pt returned from MRI °

## 2013-06-13 NOTE — ED Notes (Signed)
Pt arrived to room D34

## 2013-06-13 NOTE — ED Provider Notes (Signed)
CSN: [id number]     Arrival date & time 06/13/13  0422 History   First MD Initiated Contact with Patient 06/13/13 [phone]     Chief Complaint  Patient presents with  . Code Stroke   (Consider location/radiation/quality/duration/timing/severity/associated sxs/prior Treatment) HPI Comments: 63 year old male, presents with a complaint of left-sided weakness. This started at 3:30 AM while the patient was in the bathroom after having multiple episodes of watery diarrhea since 10:00 last night. He had a syncopal event, fell to the ground and then vomited. He was found to be pale and diaphoretic, he was altered and not answering questions appropriately initially, paramedics stated that his vital signs were relatively normal, about halfway to the hospital he returned to his baseline And at this time complains of left upper and left lower extremity weakness, numbness  and tingling. Nothing seems to make this better or worse, no associated headache, fever, chills, chest pain, shortness of breath.  The history is provided by the patient and the spouse.    Past Medical History  Diagnosis Date  . Diverticulosis    Past Surgical History  Procedure Laterality Date  . Appendectomy    . Knee surgery    . Shoulder surgery    . Inguinal hernia repair  02/16/2012    Procedure: HERNIA REPAIR INGUINAL ADULT;  Surgeon: Shelly Rubenstein, MD;  Location: [facility];  Service: General;  Laterality: Left;  left inguinal hernia repair with mesh   History reviewed. No pertinent family history. History  Substance Use Topics  . Smoking status: Never Smoker   . Smokeless tobacco: Not on file  . Alcohol Use: No    Review of Systems  All other systems reviewed and are negative.    Allergies  Review of patient's allergies indicates no known allergies.  Home Medications   Current Outpatient Rx  Name  Route  Sig  Dispense  Refill  . finasteride (PROSCAR) 5 MG tablet   Oral   Take 5 mg by mouth  daily.         . diphenoxylate-atropine (LOMOTIL) 2.5-0.025 MG per tablet   Oral   Take 1 tablet by mouth 4 (four) times daily as needed for diarrhea or loose stools.   30 tablet   0   . naproxen (NAPROSYN) 500 MG tablet   Oral   Take 1 tablet (500 mg total) by mouth 2 (two) times daily with a meal.   20 tablet   0    BP 140/93  Pulse 89  Resp 16  SpO2 95% Physical Exam  Nursing note and vitals reviewed. Constitutional: He appears well-developed and well-nourished. No distress.  HENT:  Head: Normocephalic and atraumatic.  Mouth/Throat: Oropharynx is clear and moist. No oropharyngeal exudate.  Eyes: Conjunctivae and EOM are normal. Pupils are equal, round, and reactive to light. Right eye exhibits no discharge. Left eye exhibits no discharge. No scleral icterus.  Neck: Normal range of motion. Neck supple. No JVD present. No thyromegaly present.  Cardiovascular: Normal rate, regular rhythm, normal heart sounds and intact distal pulses.  Exam reveals no gallop and no friction rub.   No murmur heard. No carotid bruit  Pulmonary/Chest: Effort normal and breath sounds normal. No respiratory distress. He has no wheezes. He has no rales.  Abdominal: Soft. Bowel sounds are normal. He exhibits no distension and no mass. There is no tenderness.  Musculoskeletal: Normal range of motion. He exhibits no edema and no tenderness.  Lymphadenopathy:    He  has no cervical adenopathy.  Neurological: He is alert.  Left upper and left lower extremity slightly weak but appears to be somewhat effort dependent, finger-nose-finger normal on the right, unable to perform this task on the left. Cranial nerves III through XII appear to be intact, speech is clear and goal-directed  Skin: Skin is warm and dry. No rash noted. No erythema.  Psychiatric: He has a normal mood and affect. His behavior is normal.    ED Course  Procedures (including critical care time) Labs Review Labs Reviewed  CBC -  Abnormal; Notable for the following:    WBC 11.5 (*)    All other components within normal limits  DIFFERENTIAL - Abnormal; Notable for the following:    Neutro Abs 8.2 (*)    All other components within normal limits  COMPREHENSIVE METABOLIC PANEL - Abnormal; Notable for the following:    Glucose, Bld 135 (*)    Creatinine, Ser 1.41 (*)    Total Protein 8.5 (*)    Total Bilirubin 1.8 (*)    GFR calc non Af Amer 53 (*)    GFR calc Af Amer 61 (*)    All other components within normal limits  GLUCOSE, CAPILLARY - Abnormal; Notable for the following:    Glucose-Capillary 140 (*)    All other components within normal limits  POCT I-STAT, CHEM 8 - Abnormal; Notable for the following:    Creatinine, Ser 1.50 (*)    Glucose, Bld 142 (*)    Hemoglobin 17.3 (*)    All other components within normal limits  ETHANOL  PROTIME-INR  APTT  TROPONIN I  URINE RAPID DRUG SCREEN (HOSP PERFORMED)  URINALYSIS, ROUTINE W REFLEX MICROSCOPIC   Imaging Review Ct Head Wo Contrast  06/13/2013   CLINICAL DATA:  Code stroke, weakness  EXAM: CT HEAD WITHOUT CONTRAST  TECHNIQUE: Contiguous axial images were obtained from the base of the skull through the vertex without intravenous contrast.  COMPARISON:  None.  FINDINGS: Maintained gray-white differentiation. Mild subcortical and periventricular white matter hypodensities, a nonspecific finding often seen in the setting of chronic microangiopathic change. No definite CT evidence of an acute infarction. No intraparenchymal hemorrhage, mass, mass effect, or abnormal extra-axial fluid collection. The ventricles, cisterns, and sulci are normal in size, shape, and position.  IMPRESSION: Mild white matter changes as above. No definite CT evidence of an acute intracranial abnormality. MRI recommended if concern for ischemia persists. Discussed via telephone with Dr. Hyacinth MeekerMiller at 4:45 a.m. on 06/13/2013.  7 mm sellar hyperdensity may reflect volume averaging through a normal  vessel. Attention at MRI/MRA to exclude a small aneurysm.   Electronically Signed   By: Jearld LeschAndrew  DelGaizo M.D.   On: 06/13/2013 04:55    ED ECG REPORT  I personally interpreted this EKG   Date: 06/13/2013   Rate: 87  Rhythm: normal sinus rhythm  QRS Axis: normal  Intervals: normal  ST/T Wave abnormalities: normal  Conduction Disutrbances:none  Narrative Interpretation:   Old EKG Reviewed: none available   MDM   1. Hemiplegia, unspecified, affecting nondominant side    Dr. Thad Rangereynolds with neurology has seen the patient, recommends immediate MRI to further characterize any possible neurologic abnormalities. Discussed with radiology regarding CT scan which shows no signs of acute hemorrhage or infarct. Possibly vagal, possibly related to dehydration.  Dr. Thad Rangereynolds requests MRI, MRI shows no signs of acute stroke according to Dr. Thad Rangereynolds and she recommends that the patient be discharged soon as he is able to  ambulate. On repeat exam at approximately 6:00 AM the patient is able to lift both arms without any difficulty or discoordination. Still has slight weakness to the left lower extremity. He has had no further diarrhea, no further syncope, no further lightheadedness and has no complaints when resting in the bed. Laboratory workup shows slight renal insufficiency with a creatinine of 1.4, otherwise no significant findings.  0700 ambulated to bathroom - stable for d/c.  Vida Roller, MD 06/13/13 [phone]

## 2013-06-13 NOTE — ED Notes (Signed)
Pt arrived to CT. 

## 2013-06-13 NOTE — Discharge Instructions (Signed)
Your testing looked really good - there was a slight change in your kidney tests (Cr of 1.4)  - this needs to be rechecked in 2 weeks - drink plenty of fluids and stay hydrated.  Please call your doctor for a followup appointment within 24-48 hours. When you talk to your doctor please let them know that you were seen in the emergency department and have them acquire all of your records so that they can discuss the findings with you and formulate a treatment plan to fully care for your new and ongoing problems.

## 2013-06-13 NOTE — ED Notes (Signed)
Dr. Reynolds at bedside.

## 2013-06-13 NOTE — ED Notes (Signed)
Per EMS: LSN [id number]. Pt reports having a watery bowel movement, after bowel movement pt felt tingling in left arm, stood up then had a syncopal episode, pt woke up and immediately started vomiting. Positive LOC, unknown if pt hit head. Pt had significant left sided deficits after waking up. Pt A&O but reported a "cloudy" sensation in his head, respirations equal and unlabored, skin warm and dry.

## 2013-06-13 NOTE — Consult Note (Signed)
Referring Physician: Hyacinth MeekerMiller    Chief Complaint: Left sided weakness  HPI: Nicholas KellWilliam Cooke is an 63 y.o. male who has been up and down all evening with diarrhea.  Went to go to the bathroom around 3AM. Reports that he felt otherwise well when he  Went to the bathroom. While on the toilet felt numbness go up his left arm, then fell out.  Family heard a thud and found the patient in the bathroom on the floor.  He was unresponsive.  No tonic-clonic activity noted.  When they tried to sit him up his head was drooping to the left.  EMS was called an d when they arrived the patient was more alert but had left sided weakness.  Patient vomited as well.  Patient was brought in as a code stroke at that time.    Date last known well: Date: 06/13/2013 Time last known well: Time: 03:15 tPA Given: No: Not felt to be a stroke  Past Medical History  Diagnosis Date  . Diverticulosis     Past Surgical History  Procedure Laterality Date  . Appendectomy    . Knee surgery    . Shoulder surgery    . Inguinal hernia repair  02/16/2012    Procedure: HERNIA REPAIR INGUINAL ADULT;  Surgeon: Shelly Rubensteinouglas A Blackman, MD;  Location: [facility];  Service: General;  Laterality: Left;  left inguinal hernia repair with mesh    Family history: Both parents died of strokes.  He has a brother with skin cancer, one with prostate cancer and one with CAD s/p CABG  Social History:  reports that he has never smoked. He does not have any smokeless tobacco history on file. He reports that he does not drink alcohol or use illicit drugs.  Allergies: No Known Allergies  Medications: I have reviewed the patient's current medications. Prior to Admission:  Current outpatient prescriptions:finasteride (PROSCAR) 5 MG tablet, Take 5 mg by mouth daily., Disp: , Rfl:   ROS: History obtained from the patient  General ROS: negative for - chills, fatigue, fever, night sweats, weight gain or weight loss Psychological ROS:  negative for - behavioral disorder, hallucinations, memory difficulties, mood swings or suicidal ideation Ophthalmic ROS: negative for - blurry vision, double vision, eye pain or loss of vision ENT ROS: negative for - epistaxis, nasal discharge, oral lesions, sore throat, tinnitus or vertigo Allergy and Immunology ROS: negative for - hives or itchy/watery eyes Hematological and Lymphatic ROS: negative for - bleeding problems, bruising or swollen lymph nodes Endocrine ROS: negative for - galactorrhea, hair pattern changes, polydipsia/polyuria or temperature intolerance Respiratory ROS: negative for - cough, hemoptysis, shortness of breath or wheezing Cardiovascular ROS: negative for - chest pain, dyspnea on exertion, edema or irregular heartbeat Gastrointestinal ROS: negative for - abdominal pain, diarrhea, hematemesis, nausea/vomiting or stool incontinence Genito-Urinary ROS: negative for - dysuria, hematuria, incontinence or urinary frequency/urgency Musculoskeletal ROS: negative for - joint swelling or muscular weakness Neurological ROS: as noted in HPI Dermatological ROS: negative for rash and skin lesion changes  Physical Examination: Blood pressure 136/88, pulse 88, resp. rate 12, SpO2 96.00%.  Neurologic Examination: Mental Status: Alert, oriented, thought content appropriate.  Speech fluent without evidence of aphasia.  Able to follow 3 step commands without difficulty. Cranial Nerves: II: Discs flat bilaterally; Visual fields grossly normal, pupils equal, round, reactive to light and accommodation III,IV, VI: ptosis not present, extra-ocular motions intact bilaterally V,VII: decreased left NLF, facial light touch sensation decreased on the left and splitting  the midline VIII: hearing normal bilaterally IX,X: gag reflex present XI: bilateral shoulder shrug XII: midline tongue extension Motor: Right : Upper extremity   5/5    Left:     Upper extremity   4-/5, arm does not hit face  but slowly drifts to bed  Lower extremity   5/5     Lower extremity   3-/5, no reciprocal downward movement on the right Tone and bulk:normal tone throughout; no atrophy noted Sensory: Pinprick and light touch decreased on the left and splitting the midline Deep Tendon Reflexes: 2+ and symmetric throughout Plantars: Right: downgoing   Left: downgoing Cerebellar: normal finger-to-nose and normal heel-to-shin testing bilaterally although requiing increased effort on the left Gait: Unable to test CV: pulses palpable throughout    Laboratory Studies:  Basic Metabolic Panel:  Recent Labs Lab 06/13/13 0427 06/13/13 0433  NA 138 138  K 4.8 4.7  CL 103 107  CO2 21  --   GLUCOSE 135* 142*  BUN 16 18  CREATININE 1.41* 1.50*  CALCIUM 9.4  --     Liver Function Tests:  Recent Labs Lab 06/13/13 0427  AST 25  ALT 22  ALKPHOS 77  BILITOT 1.8*  PROT 8.5*  ALBUMIN 4.1   No results found for this basename: LIPASE, AMYLASE,  in the last 168 hours No results found for this basename: AMMONIA,  in the last 168 hours  CBC:  Recent Labs Lab 06/13/13 0427 06/13/13 0433  WBC 11.5*  --   NEUTROABS 8.2*  --   HGB 16.1 17.3*  HCT 45.7 51.0  MCV 83.2  --   PLT 220  --     Cardiac Enzymes:  Recent Labs Lab 06/13/13 0427  TROPONINI <0.30    BNP: No components found with this basename: POCBNP,   CBG:  Recent Labs Lab 06/13/13 0437  GLUCAP 140*    Microbiology: No results found for this or any previous visit.  Coagulation Studies:  Recent Labs  06/13/13 0427  LABPROT 13.6  INR 1.06    Urinalysis: No results found for this basename: COLORURINE, APPERANCEUR, LABSPEC, PHURINE, GLUCOSEU, HGBUR, BILIRUBINUR, KETONESUR, PROTEINUR, UROBILINOGEN, NITRITE, LEUKOCYTESUR,  in the last 168 hours  Lipid Panel: No results found for this basename: chol,  trig,  hdl,  cholhdl,  vldl,  ldlcalc    HgbA1C:  No results found for this basename: HGBA1C    Urine Drug  Screen:   No results found for this basename: labopia,  cocainscrnur,  labbenz,  amphetmu,  thcu,  labbarb    Alcohol Level:   Recent Labs Lab 06/13/13 0427  ETH <11    Other results: EKG: sinus rhythm at 87 bpm.  Imaging: Ct Head Wo Contrast  06/13/2013   CLINICAL DATA:  Code stroke, weakness  EXAM: CT HEAD WITHOUT CONTRAST  TECHNIQUE: Contiguous axial images were obtained from the base of the skull through the vertex without intravenous contrast.  COMPARISON:  None.  FINDINGS: Maintained gray-white differentiation. Mild subcortical and periventricular white matter hypodensities, a nonspecific finding often seen in the setting of chronic microangiopathic change. No definite CT evidence of an acute infarction. No intraparenchymal hemorrhage, mass, mass effect, or abnormal extra-axial fluid collection. The ventricles, cisterns, and sulci are normal in size, shape, and position.  IMPRESSION: Mild white matter changes as above. No definite CT evidence of an acute intracranial abnormality. MRI recommended if concern for ischemia persists. Discussed via telephone with Dr. Hyacinth Meeker at 4:45 a.m. on 06/13/2013.  7 mm sellar  hyperdensity may reflect volume averaging through a normal vessel. Attention at MRI/MRA to exclude a small aneurysm.   Electronically Signed   By: Jearld Lesch M.D.   On: 06/13/2013 04:55    Assessment: 63 y.o. male presenting after a syncopal episode with left sided weakness and numbness.  Patient without stroke risk factors.  Patient has had multiple episodes of diarrhea and vomiting and may very well be dehydrated and have had a vasovagal syncopal episode.  Neurological examination otherwise has multiple functional features.  Head CT reviewed and shows no acute changes.  Further work up recommended.  Stroke Risk Factors - none  Plan: 1. MRI of the brain without contrast  Thana Farr, MD [facility] [phone] 06/13/2013, 6:10 AM  Addendum: MRI of the  brain reviewed and is unremarkable.  No evidence of acute infarct.  Further work up for stroke not indicated at this time.  Would expect weakness to improve.  No further neurologic intervention is recommended at this time.  If further questions arise, please call or page at that time.  Thank you for allowing neurology to participate in the care of this patient.  Case discussed with Dr. Hyacinth Meeker.    Thana Farr, MD [facility] [phone] 06/13/2013  6:17 AM

## 2013-06-16 LAB — URINE CULTURE: Colony Count: 30000

## 2013-10-09 NOTE — Progress Notes (Signed)
Please enter orders in EPIC as patient has pre-op appointment on 10/15/13 at 8 am! Thank you!

## 2013-10-10 ENCOUNTER — Other Ambulatory Visit: Payer: Self-pay | Admitting: Orthopedic Surgery

## 2013-10-10 ENCOUNTER — Encounter (HOSPITAL_COMMUNITY): Payer: Self-pay | Admitting: Pharmacy Technician

## 2013-10-11 NOTE — Patient Instructions (Signed)
Nicholas Cooke  10/11/2013   Your procedure is scheduled on:  10/18/2013  300pm-400pm  Report to [facility].  Follow the Signs to Short Stay Center at   100pm  Call this number if you have problems the morning of surgery: [phone]   Remember:   Do not eat food after midnite.               May have clear liquids until 0830am then npo.    Take these medicines the morning of surgery with A SIP OF WATER:    Do not wear jewelry,   Do not wear lotions, powders, or perfumes.    Men may shave face and neck.  Do not bring valuables to the hospital.  Contacts, dentures or bridgework may not be worn into surgery.       Patients discharged the day of surgery will not be allowed to drive  home.  Name and phone number of your driver:      Please read over the following fact sheets that you were given:    CLEAR LIQUID DIET   Foods Allowed                                                                     Foods Excluded  Coffee and tea, regular and decaf                             liquids that you cannot  Plain Jell-O in any flavor                                             see through such as: Fruit ices (not with fruit pulp)                                     milk, soups, orange juice  Iced Popsicles                                    All solid food Carbonated beverages, regular and diet                                    Cranberry, grape and apple juices Sports drinks like Gatorade Lightly seasoned clear broth or consume(fat free) Sugar, honey syrup  Sample Menu Breakfast                                Lunch                                     Supper Cranberry juice                    Beef broth  Chicken broth Jell-O                                     Grape juice                           Apple juice Coffee or tea                        Jell-O                                      Popsicle                                                 Coffee or tea                        Coffee or tea  _____________________________________________________________________  [facility] - Preparing for Surgery Before surgery, you can play an important role.  Because skin is not sterile, your skin needs to be as free of germs as possible.  You can reduce the number of germs on your skin by washing with CHG (chlorahexidine gluconate) soap before surgery.  CHG is an antiseptic cleaner which kills germs and bonds with the skin to continue killing germs even after washing. Please DO NOT use if you have an allergy to CHG or antibacterial soaps.  If your skin becomes reddened/irritated stop using the CHG and inform your nurse when you arrive at Short Stay. Do not shave (including legs and underarms) for at least 48 hours prior to the first CHG shower.  You may shave your face/neck. Please follow these instructions carefully:  1.  Shower with CHG Soap the night before surgery and the  morning of Surgery.  2.  If you choose to wash your hair, wash your hair first as usual with your  normal  shampoo.  3.  After you shampoo, rinse your hair and body thoroughly to remove the  shampoo.                           4.  Use CHG as you would any other liquid soap.  You can apply chg directly  to the skin and wash                       Gently with a scrungie or clean washcloth.  5.  Apply the CHG Soap to your body ONLY FROM THE NECK DOWN.   Do not use on face/ open                           Wound or open sores. Avoid contact with eyes, ears mouth and genitals (private parts).                       Wash face,  Genitals (private parts) with your normal soap.             6.  Wash thoroughly, paying special attention to the area  where your surgery  will be performed.  7.  Thoroughly rinse your body with warm water from the neck down.  8.  DO NOT shower/wash with your normal soap after using and rinsing off  the CHG Soap.                9.  Pat yourself dry with a clean  towel.            10.  Wear clean pajamas.            11.  Place clean sheets on your bed the night of your first shower and do not  sleep with pets. Day of Surgery : Do not apply any lotions/deodorants the morning of surgery.  Please wear clean clothes to the hospital/surgery center.  FAILURE TO FOLLOW THESE INSTRUCTIONS MAY RESULT IN THE CANCELLATION OF YOUR SURGERY PATIENT SIGNATURE_________________________________  NURSE SIGNATURE__________________________________  ________________________________________________________________________   Nicholas Cooke  An incentive spirometer is a tool that can help keep your lungs clear and active. This tool measures how well you are filling your lungs with each breath. Taking long deep breaths may help reverse or decrease the chance of developing breathing (pulmonary) problems (especially infection) following:  A long period of time when you are unable to move or be active. BEFORE THE PROCEDURE   If the spirometer includes an indicator to show your best effort, your nurse or respiratory therapist will set it to a desired goal.  If possible, sit up straight or lean slightly forward. Try not to slouch.  Hold the incentive spirometer in an upright position. INSTRUCTIONS FOR USE  1. Sit on the edge of your bed if possible, or sit up as far as you can in bed or on a chair. 2. Hold the incentive spirometer in an upright position. 3. Breathe out normally. 4. Place the mouthpiece in your mouth and seal your lips tightly around it. 5. Breathe in slowly and as deeply as possible, raising the piston or the ball toward the top of the column. 6. Hold your breath for 3-5 seconds or for as long as possible. Allow the piston or ball to fall to the bottom of the column. 7. Remove the mouthpiece from your mouth and breathe out normally. 8. Rest for a few seconds and repeat Steps 1 through 7 at least 10 times every 1-2 hours when you are awake. Take your  time and take a few normal breaths between deep breaths. 9. The spirometer may include an indicator to show your best effort. Use the indicator as a goal to work toward during each repetition. 10. After each set of 10 deep breaths, practice coughing to be sure your lungs are clear. If you have an incision (the cut made at the time of surgery), support your incision when coughing by placing a pillow or rolled up towels firmly against it. Once you are able to get out of bed, walk around indoors and cough well. You may stop using the incentive spirometer when instructed by your caregiver.  RISKS AND COMPLICATIONS  Take your time so you do not get dizzy or light-headed.  If you are in pain, you may need to take or ask for pain medication before doing incentive spirometry. It is harder to take a deep breath if you are having pain. AFTER USE  Rest and breathe slowly and easily.  It can be helpful to keep track of a log of your progress. Your caregiver can provide you with a simple  table to help with this. If you are using the spirometer at home, follow these instructions: [place] IF:   You are having difficultly using the spirometer.  You have trouble using the spirometer as often as instructed.  Your pain medication is not giving enough relief while using the spirometer.  You develop fever of 100.5 F (38.1 C) or higher. SEEK IMMEDIATE MEDICAL CARE IF:   You cough up bloody sputum that had not been present before.  You develop fever of 102 F (38.9 C) or greater.  You develop worsening pain at or near the incision site. MAKE SURE YOU:   Understand these instructions.  Will watch your condition.  Will get help right away if you are not doing well or get worse. Document Released: 09/12/2006 Document Revised: 07/25/2011 Document Reviewed: 11/13/2006 ExitCare Patient Information 2014 [place].   ________________________________________________________________________ , coughing and deep breathing exercises, leg exercises

## 2013-10-15 ENCOUNTER — Encounter (HOSPITAL_COMMUNITY): Payer: Self-pay

## 2013-10-15 ENCOUNTER — Encounter (HOSPITAL_COMMUNITY)
Admission: RE | Admit: 2013-10-15 | Discharge: 2013-10-15 | Disposition: A | Discharge status: Home / Self Care | Payer: Worker's Compensation | Source: Ambulatory Visit | Attending: Specialist | Admitting: Specialist

## 2013-10-15 LAB — CBC
HCT: 45.3 % (ref 39.0–52.0)
Hemoglobin: 15.8 g/dL (ref 13.0–17.0)
MCH: 28.7 pg (ref 26.0–34.0)
MCHC: 34.9 g/dL (ref 30.0–36.0)
MCV: 82.2 fL (ref 78.0–100.0)
Platelets: 220 10*3/uL (ref 150–400)
RBC: 5.51 MIL/uL (ref 4.22–5.81)
RDW: 13.4 % (ref 11.5–15.5)
WBC: 7 10*3/uL (ref 4.0–10.5)

## 2013-10-15 NOTE — Progress Notes (Signed)
At time of preop appointment blood pressure was initially 179/106 in left arm.  On recheck 20 minutes later 157/101.  At 0830am blood pressure was 163/103 in left arm and 173/114 in right arm.  Patient states had not eaten breakfast.   Denies any chest pain . Shortness of breath, dizziness or lightheadedness.   .  Instructed patient to go eat some breakfast.  Instructed patient to notify PCP, Dr Lupe Carney of elevated blood pressure. I informed patient that I would send blood pressure info to Dr Lupe Carney.

## 2013-10-15 NOTE — Progress Notes (Signed)
Blood pressure elevated at time of preop appointment .  Lowest reading was 157/101.  Called office of PCP - Dr Lupe Carney and left message with nurse.  Also faxed to Dr Lupe Carney all blood pressure readings obtained.  Patient denied any chest pain, shortness of breath, dizziness or headache at time of preop appointment.  Instructed patient to also call office of Dr Lupe Carney and let PCP aware of elevated blood pressure readings.

## 2013-10-15 NOTE — Progress Notes (Signed)
Routed blood pressure message via fax to Dr Lupe Carney.  Also called office and left message for nurse of Dr Lupe Carney, Georganna Skeans regarding blood pressure readings and information.

## 2013-10-17 ENCOUNTER — Other Ambulatory Visit: Payer: Self-pay | Admitting: Orthopedic Surgery

## 2013-10-17 NOTE — H&P (Signed)
Nicholas Cooke is an 63 y.o. male.   Chief Complaint: R knee pain HPI: The patient is a 63 year old male who presents today for follow up of their knee. The patient is being followed for their right knee pain. They are now 24 weeks and 2 days out from injury. Symptoms reported today include: pain, swelling, popping and grinding. and report their pain level to be 2 / 10. Current treatment includes: bracing and NSAIDs (Naproxen). The patient presents today following MRI. Note for "Follow-up Knee": The patient is currently working full duty.  The patient follows up. Nicholas Cooke has partial and full thickness chondral loss medial and lateral compartments, degenerative meniscus tear, unicompartmental patellofemoral placement. Nicholas Cooke also has a multiseptated lesion in the tibial metaphysis suggestive of an interosseous ganglion cyst.   Past Medical History  Diagnosis Date  . Diverticulosis     Past Surgical History  Procedure Laterality Date  . Appendectomy    . Knee surgery    . Shoulder surgery    . Inguinal hernia repair  02/16/2012    Procedure: HERNIA REPAIR INGUINAL ADULT;  Surgeon: Shelly Rubenstein, MD;  Location: [facility];  Service: General;  Laterality: Left;  left inguinal hernia repair with mesh    No family history on file. Social History:  reports that Nicholas Cooke has never smoked. Nicholas Cooke has never used smokeless tobacco. Nicholas Cooke reports that Nicholas Cooke does not drink alcohol or use illicit drugs.  Allergies: No Known Allergies   (Not in a hospital admission)  No results found for this or any previous visit (from the past 48 hour(s)). No results found.  Review of Systems  Constitutional: Negative.   HENT: Negative.   Eyes: Negative.   Respiratory: Negative.   Cardiovascular: Negative.   Gastrointestinal: Negative.   Genitourinary: Negative.   Musculoskeletal: Positive for joint pain.  Skin: Negative.   Neurological: Negative.   Psychiatric/Behavioral: Negative.     There were  no vitals taken for this visit. Physical Exam  Constitutional: Nicholas Cooke is oriented to person, place, and time. Nicholas Cooke appears well-developed and well-nourished.  HENT:  Head: Normocephalic and atraumatic.  Eyes: Conjunctivae and EOM are normal. Pupils are equal, round, and reactive to light.  Neck: Normal range of motion. Neck supple.  Cardiovascular: Normal rate and regular rhythm.   Respiratory: Effort normal and breath sounds normal.  GI: Soft. Bowel sounds are normal.  Musculoskeletal:  On exam tender along the medial joint line. Equivocal McMurray's. Trace effusion.  Knee exam on inspection reveals no evidence of soft tissue swelling, ecchymosis, deformity, or erythema. On palpation there is no tenderness in the lateral joint line. No patellofemoral pain with compression. Nontender over the fibular head of the peroneal nerve. Nontender over the quadriceps insertion of the patellar ligament insertion. The range of motion was full. Provocative maneuvers revealed a negative Lachman, negative anterior and posterior drawer. No instability was noted with varus and valgus stressing at 0 or 30 degrees. On manual motor test the quadriceps and hamstrings were 5/5. Sensory exam was intact to light touch.  Neurological: Nicholas Cooke is alert and oriented to person, place, and time. Nicholas Cooke has normal reflexes.  Skin: Skin is warm and dry.  Psychiatric: Nicholas Cooke has a normal mood and affect.     Assessment/Plan 1. Symptomatic medial meniscal tear of the right knee status post work injury in October of 2014. 2. History of patellofemoral arthroplasty. 3. Incidental interosseous ganglion cyst.  I had a long discussion with Nicholas Cooke concerning his  current pathology, relevant anatomy, and treatment options and incident findings. I phoned Nicholas Cooke about this, Nicholas Cooke does not feel that there is a fracture associated with this. If there is a meniscus tear Nicholas Cooke could certainly be symptomatic secondary to meniscus tear.  Discussed options of living with his symptoms versus arthroscopic partial meniscectomy. Dr. Carroll did suggest since no prior cross sectional imaging, that an enhanced study would be clinically indicated for the proximal tibia so we will have that ordered to make sure that lesion has not extended into the tibial metaphysis or there are any other abnormal characteristics associated with this incidental finding. Nicholas Cooke is to continue with his current full duties. We discussed knee arthroplasty versus living with his symptoms.  I had a long discussion with the patient concerning the risks and benefits of knee arthroscopy including help from the arthroscopic procedure as well as no help from the arthroscopic procedure or worsening of symptoms. Also discussed infection, DVT, PE, anesthetic complications, etc. Also discussed the possibility of repeat arthroscopic surgery required in the future or total knee replacement. I provided the patient with an illustrated handout and discussed it in detail as well as discussed the postoperative and perioperative courses and return to functional activities including work. Need for postoperative DVT prophylaxis was discussed as well.  Nicholas Cooke does report popping, locking, giving way, and persistent mechanical symptoms. Tibial tubercle elevation as well in the past, and patellofemoral arthroplasty. His pain is more medial and posterior medial. Discussed outpatient two follow up physical therapy, return to work in three to four weeks, minimally invasive.  Nicholas Cooke M. Nicholas Cooke 10/17/2013, 1:42 PM    

## 2013-10-18 ENCOUNTER — Ambulatory Visit (HOSPITAL_COMMUNITY): Payer: Worker's Compensation | Admitting: Registered Nurse

## 2013-10-18 ENCOUNTER — Encounter (HOSPITAL_COMMUNITY): Payer: Worker's Compensation | Admitting: Registered Nurse

## 2013-10-18 ENCOUNTER — Encounter (HOSPITAL_COMMUNITY)
Admission: RE | Disposition: A | Discharge status: Home / Self Care | Payer: Self-pay | Source: Ambulatory Visit | Attending: Specialist

## 2013-10-18 ENCOUNTER — Ambulatory Visit (HOSPITAL_COMMUNITY)
Admission: RE | Admit: 2013-10-18 | Discharge: 2013-10-18 | Disposition: A | Discharge status: Home / Self Care | Payer: Worker's Compensation | Source: Ambulatory Visit | Attending: Specialist | Admitting: Specialist

## 2013-10-18 ENCOUNTER — Encounter (HOSPITAL_COMMUNITY): Payer: Self-pay | Admitting: *Deleted

## 2013-10-18 DIAGNOSIS — S83249A Other tear of medial meniscus, current injury, unspecified knee, initial encounter: Secondary | ICD-10-CM

## 2013-10-18 DIAGNOSIS — M1710 Unilateral primary osteoarthritis, unspecified knee: Secondary | ICD-10-CM | POA: Insufficient documentation

## 2013-10-18 DIAGNOSIS — M23305 Other meniscus derangements, unspecified medial meniscus, unspecified knee: Secondary | ICD-10-CM | POA: Insufficient documentation

## 2013-10-18 DIAGNOSIS — I10 Essential (primary) hypertension: Secondary | ICD-10-CM | POA: Insufficient documentation

## 2013-10-18 HISTORY — DX: Essential (primary) hypertension: I10

## 2013-10-18 HISTORY — PX: KNEE ARTHROSCOPY WITH MEDIAL MENISECTOMY: SHX5651

## 2013-10-18 SURGERY — ARTHROSCOPY, KNEE, WITH MEDIAL MENISCECTOMY
Anesthesia: General | Site: Knee | Laterality: Right

## 2013-10-18 MED ORDER — FENTANYL CITRATE 0.05 MG/ML IJ SOLN
INTRAMUSCULAR | Status: DC | PRN
  Administered 2013-10-18: 100 ug via INTRAVENOUS

## 2013-10-18 MED ORDER — EPINEPHRINE HCL 1 MG/ML IJ SOLN
INTRAMUSCULAR | Status: DC | PRN
  Administered 2013-10-18: 1 mg

## 2013-10-18 MED ORDER — HYDROMORPHONE HCL PF 1 MG/ML IJ SOLN: 0.2500 mg | INTRAMUSCULAR | Status: DC | PRN

## 2013-10-18 MED ORDER — BUPIVACAINE-EPINEPHRINE 0.5% -1:200000 IJ SOLN
INTRAMUSCULAR | Status: AC
  Filled 2013-10-18: qty 1

## 2013-10-18 MED ORDER — FENTANYL CITRATE 0.05 MG/ML IJ SOLN
INTRAMUSCULAR | Status: AC
  Filled 2013-10-18: qty 5

## 2013-10-18 MED ORDER — CEFAZOLIN SODIUM-DEXTROSE 2-3 GM-% IV SOLR
2.0000 g | INTRAVENOUS | Status: AC
  Administered 2013-10-18: 2 g via INTRAVENOUS

## 2013-10-18 MED ORDER — PHENYLEPHRINE 40 MCG/ML (10ML) SYRINGE FOR IV PUSH (FOR BLOOD PRESSURE SUPPORT)
PREFILLED_SYRINGE | INTRAVENOUS | Status: AC
  Filled 2013-10-18: qty 10

## 2013-10-18 MED ORDER — DEXAMETHASONE SODIUM PHOSPHATE 10 MG/ML IJ SOLN
INTRAMUSCULAR | Status: DC | PRN
  Administered 2013-10-18: 10 mg via INTRAVENOUS

## 2013-10-18 MED ORDER — OXYCODONE-ACETAMINOPHEN 5-325 MG PO TABS: 1.0000 | ORAL_TABLET | ORAL | Status: DC | PRN

## 2013-10-18 MED ORDER — EPINEPHRINE HCL 1 MG/ML IJ SOLN
INTRAMUSCULAR | Status: AC
  Filled 2013-10-18: qty 1

## 2013-10-18 MED ORDER — LACTATED RINGERS IV SOLN
INTRAVENOUS | Status: DC
  Administered 2013-10-18: 1000 mL via INTRAVENOUS
  Administered 2013-10-18: 16:00:00 via INTRAVENOUS

## 2013-10-18 MED ORDER — PROPOFOL 10 MG/ML IV BOLUS
INTRAVENOUS | Status: DC | PRN
  Administered 2013-10-18: 200 mg via INTRAVENOUS

## 2013-10-18 MED ORDER — MEPERIDINE HCL 50 MG/ML IJ SOLN: 6.2500 mg | INTRAMUSCULAR | Status: DC | PRN

## 2013-10-18 MED ORDER — PROPOFOL 10 MG/ML IV BOLUS
INTRAVENOUS | Status: AC
  Filled 2013-10-18: qty 20

## 2013-10-18 MED ORDER — OXYCODONE HCL 5 MG/5ML PO SOLN
5.0000 mg | Freq: Once | ORAL | Status: DC | PRN
  Filled 2013-10-18: qty 5

## 2013-10-18 MED ORDER — PHENYLEPHRINE HCL 10 MG/ML IJ SOLN
INTRAMUSCULAR | Status: DC | PRN
  Administered 2013-10-18: 120 ug via INTRAVENOUS
  Administered 2013-10-18 (×2): 80 ug via INTRAVENOUS
  Administered 2013-10-18: 120 ug via INTRAVENOUS

## 2013-10-18 MED ORDER — ACETAMINOPHEN 10 MG/ML IV SOLN
1000.0000 mg | Freq: Once | INTRAVENOUS | Status: AC
  Administered 2013-10-18: 1000 mg via INTRAVENOUS
  Filled 2013-10-18: qty 100

## 2013-10-18 MED ORDER — EPHEDRINE SULFATE 50 MG/ML IJ SOLN
INTRAMUSCULAR | Status: DC | PRN
  Administered 2013-10-18: 10 mg via INTRAVENOUS

## 2013-10-18 MED ORDER — MIDAZOLAM HCL 2 MG/2ML IJ SOLN
INTRAMUSCULAR | Status: AC
  Filled 2013-10-18: qty 2

## 2013-10-18 MED ORDER — PROMETHAZINE HCL 25 MG/ML IJ SOLN: 6.2500 mg | INTRAMUSCULAR | Status: DC | PRN

## 2013-10-18 MED ORDER — OXYCODONE HCL 5 MG PO TABS: 5.0000 mg | ORAL_TABLET | Freq: Once | ORAL | Status: DC | PRN

## 2013-10-18 MED ORDER — ONDANSETRON HCL 4 MG/2ML IJ SOLN
INTRAMUSCULAR | Status: DC | PRN
  Administered 2013-10-18: 4 mg via INTRAVENOUS

## 2013-10-18 MED ORDER — BUPIVACAINE-EPINEPHRINE 0.5% -1:200000 IJ SOLN
INTRAMUSCULAR | Status: DC | PRN
  Administered 2013-10-18: 20 mL

## 2013-10-18 MED ORDER — METHOCARBAMOL 500 MG PO TABS: 500.0000 mg | ORAL_TABLET | Freq: Three times a day (TID) | ORAL | Status: DC | PRN

## 2013-10-18 MED ORDER — DEXAMETHASONE SODIUM PHOSPHATE 10 MG/ML IJ SOLN
INTRAMUSCULAR | Status: AC
  Filled 2013-10-18: qty 1

## 2013-10-18 MED ORDER — IBUPROFEN 800 MG PO TABS: 800.0000 mg | ORAL_TABLET | Freq: Three times a day (TID) | ORAL | Status: DC | PRN

## 2013-10-18 MED ORDER — MIDAZOLAM HCL 5 MG/5ML IJ SOLN
INTRAMUSCULAR | Status: DC | PRN
  Administered 2013-10-18: 2 mg via INTRAVENOUS

## 2013-10-18 MED ORDER — ONDANSETRON HCL 4 MG/2ML IJ SOLN
INTRAMUSCULAR | Status: AC
  Filled 2013-10-18: qty 2

## 2013-10-18 MED ORDER — LIDOCAINE HCL (CARDIAC) 10 MG/ML IV SOLN
INTRAVENOUS | Status: DC | PRN
  Administered 2013-10-18: 100 mg via INTRAVENOUS

## 2013-10-18 MED ORDER — LIDOCAINE HCL (CARDIAC) 20 MG/ML IV SOLN
INTRAVENOUS | Status: AC
  Filled 2013-10-18: qty 5

## 2013-10-18 MED ORDER — LACTATED RINGERS IR SOLN
Status: DC | PRN
  Administered 2013-10-18: 6000 mL

## 2013-10-18 MED ORDER — CEFAZOLIN SODIUM-DEXTROSE 2-3 GM-% IV SOLR
INTRAVENOUS | Status: AC
  Filled 2013-10-18: qty 50

## 2013-10-18 MED ORDER — BUPIVACAINE-EPINEPHRINE (PF) 0.25% -1:200000 IJ SOLN
INTRAMUSCULAR | Status: AC
  Filled 2013-10-18: qty 30

## 2013-10-18 SURGICAL SUPPLY — 27 items
BANDAGE ELASTIC 6 VELCRO ST LF (GAUZE/BANDAGES/DRESSINGS) ×1 IMPLANT
BLADE 4.2CUDA (BLADE) IMPLANT
BLADE CUDA SHAVER 3.5 (BLADE) ×2 IMPLANT
BNDG COHESIVE 6X5 TAN STRL LF (GAUZE/BANDAGES/DRESSINGS) ×2 IMPLANT
CLOTH 2% CHLOROHEXIDINE 3PK (PERSONAL CARE ITEMS) ×2 IMPLANT
DECANTER SPIKE VIAL GLASS SM (MISCELLANEOUS) ×1 IMPLANT
DRSG EMULSION OIL 3X3 NADH (GAUZE/BANDAGES/DRESSINGS) ×2 IMPLANT
DURAPREP 26ML APPLICATOR (WOUND CARE) ×2 IMPLANT
GLOVE BIOGEL PI IND STRL 8 (GLOVE) ×1 IMPLANT
GLOVE BIOGEL PI IND STRL 8.5 (GLOVE) IMPLANT
GLOVE BIOGEL PI INDICATOR 8 (GLOVE) ×1
GLOVE BIOGEL PI INDICATOR 8.5 (GLOVE) ×1
GLOVE SURG SS PI 8.0 STRL IVOR (GLOVE) ×4 IMPLANT
GLOVE SURG SS PI 8.5 STRL IVOR (GLOVE) ×1
GLOVE SURG SS PI 8.5 STRL STRW (GLOVE) IMPLANT
GOWN STRL REIN 2XL XLG LVL4 (GOWN DISPOSABLE) ×1 IMPLANT
GOWN STRL REUS W/TWL XL LVL3 (GOWN DISPOSABLE) ×2 IMPLANT
IV LACTATED RINGER IRRG 3000ML (IV SOLUTION) ×4
IV LR IRRIG 3000ML ARTHROMATIC (IV SOLUTION) IMPLANT
MANIFOLD NEPTUNE II (INSTRUMENTS) ×2 IMPLANT
PACK ARTHROSCOPY WL (CUSTOM PROCEDURE TRAY) ×2 IMPLANT
PADDING CAST COTTON 6X4 STRL (CAST SUPPLIES) ×2 IMPLANT
SET ARTHROSCOPY TUBING (MISCELLANEOUS) ×2
SET ARTHROSCOPY TUBING LN (MISCELLANEOUS) ×1 IMPLANT
SUT ETHILON 4 0 PS 2 18 (SUTURE) ×2 IMPLANT
WAND 90 DEG TURBOVAC W/CORD (SURGICAL WAND) ×2 IMPLANT
WRAP KNEE MAXI GEL POST OP (GAUZE/BANDAGES/DRESSINGS) ×2 IMPLANT

## 2013-10-18 NOTE — Interval H&P Note (Signed)
History and Physical Interval Note:  10/18/2013 2:49 PM  Nicholas Cooke  has presented today for surgery, with the diagnosis of right medial meniscus tear  The various methods of treatment have been discussed with the patient and family. After consideration of risks, benefits and other options for treatment, the patient has consented to  Procedure(s): RIGHT KNEE ARTHROSCOPY WITH PARTIAL MEDIAL MENISECTOMY (Right) as a surgical intervention .  The patient's history has been reviewed, patient examined, no change in status, stable for surgery.  I have reviewed the patient's chart and labs.  Questions were answered to the patient's satisfaction.     Javier Docker

## 2013-10-18 NOTE — Transfer of Care (Signed)
Immediate Anesthesia Transfer of Care Note  Patient: Nicholas Cooke  Procedure(s) Performed: Procedure(s): RIGHT KNEE ARTHROSCOPY WITH PARTIAL MEDIAL MENISECTOMY (Right)  Patient Location: PACU  Anesthesia Type:General  Level of Consciousness: awake, alert , oriented and patient cooperative  Airway & Oxygen Therapy: Patient Spontanous Breathing and Patient connected to face mask oxygen  Post-op Assessment: Report given to PACU RN, Post -op Vital signs reviewed and stable and Patient moving all extremities X 4  Post vital signs: stable  Complications: No apparent anesthesia complications

## 2013-10-18 NOTE — Interval H&P Note (Signed)
History and Physical Interval Note:  10/18/2013 12:59 PM  Nicholas Cooke  has presented today for surgery, with the diagnosis of right medial meniscus tear  The various methods of treatment have been discussed with the patient and family. After consideration of risks, benefits and other options for treatment, the patient has consented to  Procedure(s): RIGHT KNEE ARTHROSCOPY WITH PARTIAL MEDIAL MENISECTOMY (Right) as a surgical intervention .  The patient's history has been reviewed, patient examined, no change in status, stable for surgery.  I have reviewed the patient's chart and labs.  Questions were answered to the patient's satisfaction.     Javier Docker

## 2013-10-18 NOTE — Discharge Instructions (Signed)

## 2013-10-18 NOTE — Anesthesia Preprocedure Evaluation (Signed)
Anesthesia Evaluation  Patient identified by MRN, date of birth, ID band Patient awake    Reviewed: Allergy & Precautions, H&P , NPO status , Patient's Chart, lab work & pertinent test results  Airway Mallampati: I TM Distance: >3 FB Neck ROM: Full    Dental no notable dental hx. (+) Teeth Intact, Dental Advisory Given   Pulmonary neg pulmonary ROS,  breath sounds clear to auscultation  Pulmonary exam normal       Cardiovascular hypertension, negative cardio ROS  Rhythm:Regular Rate:Normal     Neuro/Psych negative neurological ROS  negative psych ROS   GI/Hepatic negative GI ROS, Neg liver ROS,   Endo/Other  negative endocrine ROS  Renal/GU negative Renal ROS  negative genitourinary   Musculoskeletal   Abdominal   Peds  Hematology negative hematology ROS (+)   Anesthesia Other Findings   Reproductive/Obstetrics negative OB ROS                           Anesthesia Physical  Anesthesia Plan  ASA: I  Anesthesia Plan: General   Post-op Pain Management: MAC Combined w/ Regional for Post-op pain   Induction: Intravenous  Airway Management Planned: LMA  Additional Equipment:   Intra-op Plan:   Post-operative Plan: Extubation in OR  Informed Consent: I have reviewed the patients History and Physical, chart, labs and discussed the procedure including the risks, benefits and alternatives for the proposed anesthesia with the patient or authorized representative who has indicated his/her understanding and acceptance.   Dental advisory given  Plan Discussed with: CRNA  Anesthesia Plan Comments:         Anesthesia Quick Evaluation

## 2013-10-18 NOTE — Anesthesia Postprocedure Evaluation (Signed)
Anesthesia Post Note  Patient: Nicholas Cooke  Procedure(s) Performed: Procedure(s) (LRB): RIGHT KNEE ARTHROSCOPY WITH PARTIAL MEDIAL MENISECTOMY (Right)  Anesthesia type: General  Patient location: PACU  Post pain: Pain level controlled  Post assessment: Post-op Vital signs reviewed  Last Vitals: BP 155/77  Pulse 68  Temp(Src) 36.3 C (Oral)  Resp 14  SpO2 98%  Post vital signs: Reviewed  Level of consciousness: sedated  Complications: No apparent anesthesia complications

## 2013-10-18 NOTE — H&P (View-Only) (Signed)
Nicholas Cooke is an 63 y.o. male.   Chief Complaint: R knee pain HPI: The patient is a 63 year old male who presents today for follow up of their knee. The patient is being followed for their right knee pain. They are now 24 weeks and 2 days out from injury. Symptoms reported today include: pain, swelling, popping and grinding. and report their pain level to be 2 / 10. Current treatment includes: bracing and NSAIDs (Naproxen). The patient presents today following MRI. Note for "Follow-up Knee": The patient is currently working full duty.  The patient follows up. He has partial and full thickness chondral loss medial and lateral compartments, degenerative meniscus tear, unicompartmental patellofemoral placement. He also has a multiseptated lesion in the tibial metaphysis suggestive of an interosseous ganglion cyst.   Past Medical History  Diagnosis Date  . Diverticulosis     Past Surgical History  Procedure Laterality Date  . Appendectomy    . Knee surgery    . Shoulder surgery    . Inguinal hernia repair  02/16/2012    Procedure: HERNIA REPAIR INGUINAL ADULT;  Surgeon: Shelly Rubenstein, MD;  Location: [facility];  Service: General;  Laterality: Left;  left inguinal hernia repair with mesh    No family history on file. Social History:  reports that he has never smoked. He has never used smokeless tobacco. He reports that he does not drink alcohol or use illicit drugs.  Allergies: No Known Allergies   (Not in a hospital admission)  No results found for this or any previous visit (from the past 48 hour(s)). No results found.  Review of Systems  Constitutional: Negative.   HENT: Negative.   Eyes: Negative.   Respiratory: Negative.   Cardiovascular: Negative.   Gastrointestinal: Negative.   Genitourinary: Negative.   Musculoskeletal: Positive for joint pain.  Skin: Negative.   Neurological: Negative.   Psychiatric/Behavioral: Negative.     There were  no vitals taken for this visit. Physical Exam  Constitutional: He is oriented to person, place, and time. He appears well-developed and well-nourished.  HENT:  Head: Normocephalic and atraumatic.  Eyes: Conjunctivae and EOM are normal. Pupils are equal, round, and reactive to light.  Neck: Normal range of motion. Neck supple.  Cardiovascular: Normal rate and regular rhythm.   Respiratory: Effort normal and breath sounds normal.  GI: Soft. Bowel sounds are normal.  Musculoskeletal:  On exam tender along the medial joint line. Equivocal McMurray's. Trace effusion.  Knee exam on inspection reveals no evidence of soft tissue swelling, ecchymosis, deformity, or erythema. On palpation there is no tenderness in the lateral joint line. No patellofemoral pain with compression. Nontender over the fibular head of the peroneal nerve. Nontender over the quadriceps insertion of the patellar ligament insertion. The range of motion was full. Provocative maneuvers revealed a negative Lachman, negative anterior and posterior drawer. No instability was noted with varus and valgus stressing at 0 or 30 degrees. On manual motor test the quadriceps and hamstrings were 5/5. Sensory exam was intact to light touch.  Neurological: He is alert and oriented to person, place, and time. He has normal reflexes.  Skin: Skin is warm and dry.  Psychiatric: He has a normal mood and affect.     Assessment/Plan 1. Symptomatic medial meniscal tear of the right knee status post work injury in October of 2014. 2. History of patellofemoral arthroplasty. 3. Incidental interosseous ganglion cyst.  I had a long discussion with Mr. Widmeyer concerning his  current pathology, relevant anatomy, and treatment options and incident findings. I phoned Dr. Tamala SerJohn Carroll about this, he does not feel that there is a fracture associated with this. If there is a meniscus tear he could certainly be symptomatic secondary to meniscus tear.  Discussed options of living with his symptoms versus arthroscopic partial meniscectomy. Dr. Noralyn Pickarroll did suggest since no prior cross sectional imaging, that an enhanced study would be clinically indicated for the proximal tibia so we will have that ordered to make sure that lesion has not extended into the tibial metaphysis or there are any other abnormal characteristics associated with this incidental finding. He is to continue with his current full duties. We discussed knee arthroplasty versus living with his symptoms.  I had a long discussion with the patient concerning the risks and benefits of knee arthroscopy including help from the arthroscopic procedure as well as no help from the arthroscopic procedure or worsening of symptoms. Also discussed infection, DVT, PE, anesthetic complications, etc. Also discussed the possibility of repeat arthroscopic surgery required in the future or total knee replacement. I provided the patient with an illustrated handout and discussed it in detail as well as discussed the postoperative and perioperative courses and return to functional activities including work. Need for postoperative DVT prophylaxis was discussed as well.  He does report popping, locking, giving way, and persistent mechanical symptoms. Tibial tubercle elevation as well in the past, and patellofemoral arthroplasty. His pain is more medial and posterior medial. Discussed outpatient two follow up physical therapy, return to work in three to four weeks, minimally invasive.  Dayna BarkerJaclyn M. Taedyn Glasscock 10/17/2013, 1:42 PM

## 2013-10-18 NOTE — Brief Op Note (Signed)
10/18/2013  3:16 PM  PATIENT:  [name] Pacein-Towner  63 y.o. male  PRE-OPERATIVE DIAGNOSIS:  right medial meniscus tear  POST-OPERATIVE DIAGNOSIS:  * No post-op diagnosis entered *  PROCEDURE:  Procedure(s): RIGHT KNEE ARTHROSCOPY WITH PARTIAL MEDIAL MENISECTOMY (Right)  SURGEON:  Surgeon(s) and Role:    * Javier Docker, MD - Primary  PHYSICIAN ASSISTANT:   ASSISTANTS: none   ANESTHESIA:   general  EBL:     BLOOD ADMINISTERED:none  DRAINS: none   LOCAL MEDICATIONS USED:  MARCAINE     SPECIMEN:  No Specimen  DISPOSITION OF SPECIMEN:  N/A  COUNTS:  YES  TOURNIQUET:  * No tourniquets in log *  DICTATION: .Other Dictation: Dictation Number [id number]  PLAN OF CARE: Discharge to home after PACU  PATIENT DISPOSITION:  PACU - hemodynamically stable.   Delay start of Pharmacological VTE agent (>24hrs) due to surgical blood loss or risk of bleeding: no

## 2013-10-19 NOTE — Op Note (Signed)
NAME:  KEELEN, NILO NO.:  [id number]  MEDICAL RECORD NO.:  [id number]  LOCATION:  [facility]                         FACILITY:  [facility]  PHYSICIAN:  Jene Every, M.D.    DATE OF BIRTH:  09-Jul-1950  DATE OF PROCEDURE:  10/18/2013 DATE OF DISCHARGE:  10/18/2013                              OPERATIVE REPORT   PREOPERATIVE DIAGNOSIS:  Medial meniscus tear of the right knee, degenerative joint disease, status post patellofemoral arthroplasty.  POSTOPERATIVE DIAGNOSIS:  Medial meniscus tear of the right knee, degenerative joint disease, status post patellofemoral arthroplasty.  PROCEDURE PERFORMED: 1. Right knee arthroscopy. 2. Partial medial meniscectomy. 3. Shaving of the lateral meniscus and debridement.  ANESTHESIA:  General.  ASSISTANT:  None.  HISTORY:  This is a 63 year old male, who sustained a work injury, medial meniscus tear, refractory to conservative treatment, mechanical symptoms, history of patellofemoral arthroplasty was indicated for knee arthroscopy, debridement, partial meniscectomy.  Risk and benefits were discussed including bleeding, infection, damage to neurovascular, DVT, PE, and anesthetic complications et Karie Soda.  TECHNIQUE:  With the patient in supine position, after induction of adequate general anesthesia, 2 g Kefzol, the right lower extremity was prepped and draped in usual sterile fashion.  A lateral parapatellar portal was fashioned with a #11 blade.  Ingress cannula atraumatically placed.  Irrigant was utilized to insufflate the joint.  Under direct visualization, a medial parapatellar portal was fashioned with a #11 blade after localization with 18-gauge needle sparing the medial meniscus.  We used 65 mmHg.  Medial compartment revealed radial tearing and complex tearing of the mid and anterior portion of the medial meniscus.  Introduced a 3.5 shaver and performed partial meniscectomy to a stable base.  There was radial fraying  posteriorly and that was shaved to the stable base as well.  Some fraying in the ACL.  On the intercondylar notch, that was debrided as well.  Lateral compartment revealed some radial fraying of lateral meniscus.  This was shaved to a stable base.  Minimal grade 2 changes of the chondral surfaces of the medial and lateral femoral condyle as well as tibial plateaus.  The metal cemented flange consistent with the patellofemoral arthroplasty was noted in the suprapatellar pouch.  A polyethylene button that was intact and fixed.  There was some generalized fraying of the retinaculum and some of the patellar tendon.  Light debridement was performed here as well as in the suprapatellar pouch.  He had normal patellofemoral tracking.  Gutters were unremarkable.  Revisit all compartments.  No further pathology amenable to arthroscopic intervention.  I, therefore, removed all instrumentation.  Portals were closed with 4-0 nylon simple sutures and 0.25% Marcaine with epinephrine was infiltrated in the joint.  Wound was dressed sterilely. [place] without difficulty and transported to the recovery room in satisfactory condition.  The patient tolerated the procedure well.  No complications.  No assistant.  Minimal blood loss.     Jene Every, M.D.     Cordelia Pen  D:  10/18/2013  T:  10/19/2013  Job:  [id number]

## 2013-10-21 ENCOUNTER — Encounter (HOSPITAL_COMMUNITY): Payer: Self-pay | Admitting: Specialist

## 2014-02-28 ENCOUNTER — Other Ambulatory Visit: Payer: Self-pay

## 2014-04-24 ENCOUNTER — Other Ambulatory Visit (HOSPITAL_COMMUNITY): Payer: Self-pay | Admitting: Specialist

## 2014-04-24 DIAGNOSIS — M2341 Loose body in knee, right knee: Secondary | ICD-10-CM

## 2014-05-13 ENCOUNTER — Encounter (HOSPITAL_COMMUNITY)
Admission: RE | Admit: 2014-05-13 | Discharge: 2014-05-13 | Disposition: A | Discharge status: Home / Self Care | Payer: Worker's Compensation | Source: Ambulatory Visit | Attending: Specialist | Admitting: Specialist

## 2014-05-13 DIAGNOSIS — M2341 Loose body in knee, right knee: Secondary | ICD-10-CM | POA: Insufficient documentation

## 2014-05-13 MED ORDER — TECHNETIUM TC 99M MEDRONATE IV KIT
25.8000 | PACK | Freq: Once | INTRAVENOUS | Status: AC | PRN
  Administered 2014-05-13: 25.8 via INTRAVENOUS

## 2014-09-16 ENCOUNTER — Other Ambulatory Visit (HOSPITAL_COMMUNITY): Payer: Self-pay | Admitting: Specialist

## 2014-09-16 DIAGNOSIS — Z9889 Other specified postprocedural states: Secondary | ICD-10-CM

## 2014-09-25 ENCOUNTER — Encounter (HOSPITAL_COMMUNITY): Payer: Self-pay

## 2014-09-25 ENCOUNTER — Ambulatory Visit (HOSPITAL_COMMUNITY): Payer: Self-pay

## 2014-09-29 ENCOUNTER — Encounter (HOSPITAL_COMMUNITY)
Admission: RE | Admit: 2014-09-29 | Discharge: 2014-09-29 | Disposition: A | Discharge status: Home / Self Care | Payer: Worker's Compensation | Source: Ambulatory Visit | Attending: Specialist | Admitting: Specialist

## 2014-09-29 ENCOUNTER — Encounter (HOSPITAL_COMMUNITY): Payer: Worker's Compensation

## 2014-09-29 DIAGNOSIS — M25561 Pain in right knee: Secondary | ICD-10-CM | POA: Insufficient documentation

## 2014-09-29 DIAGNOSIS — Z9889 Other specified postprocedural states: Secondary | ICD-10-CM

## 2014-09-29 DIAGNOSIS — Z87828 Personal history of other (healed) physical injury and trauma: Secondary | ICD-10-CM | POA: Diagnosis not present

## 2014-09-29 MED ORDER — TECHNETIUM TC 99M MEDRONATE IV KIT
25.0000 | PACK | Freq: Once | INTRAVENOUS | Status: AC | PRN
  Administered 2014-09-29: 25 via INTRAVENOUS

## 2014-11-18 ENCOUNTER — Inpatient Hospital Stay
Admission: RE | Admit: 2014-11-18 | Discharge: 2014-11-18 | Disposition: A | Discharge status: Home / Self Care | Payer: Self-pay | Source: Ambulatory Visit | Attending: Specialist | Admitting: Specialist

## 2014-11-18 ENCOUNTER — Other Ambulatory Visit: Payer: Self-pay | Admitting: Specialist

## 2014-11-18 DIAGNOSIS — R52 Pain, unspecified: Secondary | ICD-10-CM

## 2014-11-19 ENCOUNTER — Other Ambulatory Visit: Payer: Self-pay | Admitting: Specialist

## 2014-11-19 ENCOUNTER — Inpatient Hospital Stay
Admission: RE | Admit: 2014-11-19 | Discharge: 2014-11-19 | Disposition: A | Discharge status: Home / Self Care | Payer: Self-pay | Source: Ambulatory Visit | Attending: Specialist | Admitting: Specialist

## 2014-11-19 DIAGNOSIS — R52 Pain, unspecified: Secondary | ICD-10-CM

## 2015-04-03 ENCOUNTER — Ambulatory Visit: Payer: Self-pay | Admitting: Orthopedic Surgery

## 2015-04-30 NOTE — Patient Instructions (Addendum)
YOUR PROCEDURE IS SCHEDULED ON :  05/14/15  REPORT TO [facility] MAIN ENTRANCE FOLLOW SIGNS TO EAST ELEVATOR - GO TO 3rd FLOOR CHECK IN AT 3 EAST NURSES STATION (SHORT STAY) AT: 5:30 AM  CALL THIS NUMBER IF YOU HAVE PROBLEMS THE MORNING OF SURGERY [phone]  REMEMBER:ONLY 1 PER PERSON MAY GO TO SHORT STAY WITH YOU TO GET READY THE MORNING OF YOUR SURGERY  DO NOT EAT FOOD OR DRINK LIQUIDS AFTER MIDNIGHT  TAKE THESE MEDICINES THE MORNING OF SURGERY: CIALIS / FENFIBRATE / FINASTERIDE   YOU MAY NOT HAVE ANY METAL ON YOUR BODY INCLUDING HAIR PINS AND PIERCING'S. DO NOT WEAR JEWELRY, MAKEUP, LOTIONS, POWDERS OR PERFUMES. DO NOT WEAR NAIL POLISH. DO NOT SHAVE 48 HRS PRIOR TO SURGERY. MEN MAY SHAVE FACE AND NECK.  DO NOT BRING VALUABLES TO HOSPITAL. [facility] IS NOT RESPONSIBLE FOR VALUABLES.  CONTACTS, DENTURES OR PARTIALS MAY NOT BE WORN TO SURGERY. LEAVE SUITCASE IN CAR. CAN BE BROUGHT TO ROOM AFTER SURGERY.  PATIENTS DISCHARGED THE DAY OF SURGERY WILL NOT BE ALLOWED TO DRIVE HOME.  PLEASE READ OVER THE FOLLOWING INSTRUCTION SHEETS _________________________________________________________________________________                                          [facility] - PREPARING FOR SURGERY  Before surgery, you can play an important role.  Because skin is not sterile, your skin needs to be as free of germs as possible.  You can reduce the number of germs on your skin by washing with CHG (chlorahexidine gluconate) soap before surgery.  CHG is an antiseptic cleaner which kills germs and bonds with the skin to continue killing germs even after washing. Please DO NOT use if you have an allergy to CHG or antibacterial soaps.  If your skin becomes reddened/irritated stop using the CHG and inform your nurse when you arrive at Short Stay. Do not shave (including legs and underarms) for at least 48 hours prior to the first CHG shower.  You may shave your face. Please follow  these instructions carefully:   1.  Shower with CHG Soap the night before surgery and the  morning of Surgery.   2.  If you choose to wash your hair, wash your hair first as usual with your  normal  Shampoo.   3.  After you shampoo, rinse your hair and body thoroughly to remove the  shampoo.                                         4.  Use CHG as you would any other liquid soap.  You can apply chg directly  to the skin and wash . Gently wash with scrungie or clean wascloth    5.  Apply the CHG Soap to your body ONLY FROM THE NECK DOWN.   Do not use on open                           Wound or open sores. Avoid contact with eyes, ears mouth and genitals (private parts).                        Genitals (private parts) with your  normal soap.              6.  Wash thoroughly, paying special attention to the area where your surgery  will be performed.   7.  Thoroughly rinse your body with warm water from the neck down.   8.  DO NOT shower/wash with your normal soap after using and rinsing off  the CHG Soap .                9.  Pat yourself dry with a clean towel.             10.  Wear clean night clothes to bed after shower             11.  Place clean sheets on your bed the night of your first shower and do not  sleep with pets.  Day of Surgery : Do not apply any lotions/deodorants the morning of surgery.  Please wear clean clothes to the hospital/surgery center.  FAILURE TO FOLLOW THESE INSTRUCTIONS MAY RESULT IN THE CANCELLATION OF YOUR SURGERY    PATIENT SIGNATURE_________________________________  ______________________________________________________________________     [name]  An incentive spirometer is a tool that can help keep your lungs clear and active. This tool measures how well you are filling your lungs with each breath. Taking long deep breaths may help reverse or decrease the chance of developing breathing (pulmonary) problems (especially infection)  following:  A long period of time when you are unable to move or be active. BEFORE THE PROCEDURE   If the spirometer includes an indicator to show your best effort, your nurse or respiratory therapist will set it to a desired goal.  If possible, sit up straight or lean slightly forward. Try not to slouch.  Hold the incentive spirometer in an upright position. INSTRUCTIONS FOR USE  1. Sit on the edge of your bed if possible, or sit up as far as you can in bed or on a chair. 2. Hold the incentive spirometer in an upright position. 3. Breathe out normally. 4. Place the mouthpiece in your mouth and seal your lips tightly around it. 5. Breathe in slowly and as deeply as possible, raising the piston or the ball toward the top of the column. 6. Hold your breath for 3-5 seconds or for as long as possible. Allow the piston or ball to fall to the bottom of the column. 7. Remove the mouthpiece from your mouth and breathe out normally. 8. Rest for a few seconds and repeat Steps 1 through 7 at least 10 times every 1-2 hours when you are awake. Take your time and take a few normal breaths between deep breaths. 9. The spirometer may include an indicator to show your best effort. Use the indicator as a goal to work toward during each repetition. 10. After each set of 10 deep breaths, practice coughing to be sure your lungs are clear. If you have an incision (the cut made at the time of surgery), support your incision when coughing by placing a pillow or rolled up towels firmly against it. Once you are able to get out of bed, walk around indoors and cough well. You may stop using the incentive spirometer when instructed by your caregiver.  RISKS AND COMPLICATIONS  Take your time so you do not get dizzy or light-headed.  If you are in pain, you may need to take or ask for pain medication before doing incentive spirometry. It is harder to take a deep  breath if you are having pain. AFTER USE  Rest and  breathe slowly and easily.  It can be helpful to keep track of a log of your progress. Your caregiver can provide you with a simple table to help with this. If you are using the spirometer at home, follow these instructions: [place] IF:   You are having difficultly using the spirometer.  You have trouble using the spirometer as often as instructed.  Your pain medication is not giving enough relief while using the spirometer.  You develop fever of 100.5 F (38.1 C) or higher. SEEK IMMEDIATE MEDICAL CARE IF:   You cough up bloody sputum that had not been present before.  You develop fever of 102 F (38.9 C) or greater.  You develop worsening pain at or near the incision site. MAKE SURE YOU:   Understand these instructions.  Will watch your condition.  Will get help right away if you are not doing well or get worse. Document Released: 09/12/2006 Document Revised: 07/25/2011 Document Reviewed: 11/13/2006 [facility] Patient Information 2014 [place], [place].   ________________________________________________________________________

## 2015-05-05 ENCOUNTER — Ambulatory Visit: Payer: Self-pay | Admitting: Orthopedic Surgery

## 2015-05-05 NOTE — H&P (Signed)
Nicholas Cooke DOB: 04-Jan-1951 Married / Language: English / Race: Black or African American Male  H&P Date: 05/05/15  Chief Complaint: R knee pain  History of Present Illness The patient is a 64 year old male who comes in today for a preoperative History and Physical. The patient is scheduled for a right total knee arthroplasty to be performed by Dr. Javier DockerJeffrey C. Beane, MD at [facility] on May 14, 2015. Nicholas Cooke is now 8418 month(s) out from right knee arthroscopy. The patient is 2 years, 2 months out from injury. He has prior hx of patellofemoral arthroplasty. Symptoms reported today include: pain and swelling. Current treatment includes: bracing and pain medications. The following medication has been used for pain control: Hydrocodone. He is refractory to viscosupplementation injections, bracing, pain medications, quad strengthening, relative rest, and activity modifications. He has had a second opinion by Dr. Luiz BlareGraves. Dr. Luiz BlareGraves felt he would not improve without surgical intervention. He, however, indicated that he was unable to confirm causation in terms of the tibial fracture. Dr. Shelle IronBeane and the patient mutually agreed to proceed with removal of hardware right knee and right total knee replacement. Risks and benefits of the procedure were discussed including stiffness, suboptimal range of motion, persistent pain, infection requiring removal of prosthesis and reinsertion, need for prophylactic antibiotics in the future, for example, dental procedures, possible need for manipulation, revision in the future and also anesthetic complications including DVT, PE, etc. We discussed the perioperative course, time in the hospital, postoperative recovery and the need for elevation to control swelling. We also discussed the predicted range of motion and the probability that squatting and kneeling would be unobtainable in the future. In addition, postoperative anticoagulation was discussed. We  have obtained preoperative medical clearance as necessary. Provided her illustrated handout and discussed it in detail. They will enroll in the total joint replacement educational forum at the hospital. His [facility] pre-op appt is tomorrow 12/21.  Allergies  No Known Drug Allergies 03/13/2013  Family History Cancer  father (lung), brother (bone) First Degree Relatives  Cerebrovascular Accident  Mother, Father. mother and father Diabetes Mellitus  mother  Social History Tobacco use  Never smoker. never smoker Illicit drug use  no Drug/Alcohol Rehab (Previously)  no Living situation  live with spouse, 2 story home with 1 step to enter Exercise  Exercises weekly; does running / walking and gym / weights Drug/Alcohol Rehab (Currently)  no Alcohol use  former drinker Marital status  married Children  4 Tobacco / smoke exposure  no Number of flights of stairs before winded  4-5 Current work status  working full time [employer]ives  none Post-Surgical Plans  home with HHPT, plans to sleep in living room on first floor  Medication History Hydrocodone-Acetaminophen (Oral) Specific strength unknown - Active. Lisinopril (Oral) Specific strength unknown - Active. Fenofibrate Micronized (Oral) Specific strength unknown - Active. Finasteride (5MG  Tablet, Oral) Active. Imipramine HCl (25MG  Tablet, Oral) Active. Cialis (5MG  Tablet, Oral) Active. Medications Reconciled Ibuprofen (Oral) Specific strength unknown - Active. (prn)  Past Surgical History Rotator Cuff Repair  left Sinus Surgery  Colon Polyp Removal - Colonoscopy  Appendectomy  Inguinal Hernia Repair  open: left Arthroscopy of Knee  right  Past Medical Hx Diverticulitis Of Colon  Hiatal Hernia  Review of Systems General Not Present- Chills, Fatigue, Fever, Memory Loss, Night Sweats, Weight Gain and Weight Loss. Skin Not Present- Eczema, Hives, Itching, Lesions and Rash. HEENT Not Present-  Dentures, Double Vision, Headache, Hearing Loss, Tinnitus  and Visual Loss. Respiratory Not Present- Allergies, Chronic Cough, Coughing up blood, Shortness of breath at rest and Shortness of breath with exertion. Cardiovascular Not Present- Chest Pain, Difficulty Breathing Lying Down, Murmur, Palpitations, Racing/skipping heartbeats and Swelling. Gastrointestinal Not Present- Abdominal Pain, Bloody Stool, Constipation, Diarrhea, Difficulty Swallowing, Heartburn, Jaundice, Loss of appetitie, Nausea and Vomiting. Male Genitourinary Not Present- Blood in Urine, Discharge, Flank Pain, Incontinence, Painful Urination, Urgency, Urinary frequency, Urinary Retention, Urinating at Night and Weak urinary stream. Musculoskeletal Present- Joint Pain and Joint Stiffness. Not Present- Back Pain, Joint Swelling, Morning Stiffness, Muscle Pain, Muscle Weakness and Spasms. Neurological Not Present- Blackout spells, Difficulty with balance, Dizziness, Paralysis, Tremor and Weakness. Psychiatric Not Present- Insomnia.  Physical Exam General Mental Status -Alert, cooperative and good historian. General Appearance-pleasant, Not in acute distress. Orientation-Oriented X3. Build & Nutrition-Well nourished and Well developed.  Head and Neck Head-normocephalic, atraumatic . Neck Global Assessment - supple, no bruit auscultated on the right, no bruit auscultated on the left.  Eye Pupil - Bilateral-Regular and Round. Motion - Bilateral-EOMI.  Chest and Lung Exam Auscultation Breath sounds - clear at anterior chest wall and clear at posterior chest wall. Adventitious sounds - No Adventitious sounds.  Cardiovascular Auscultation Rhythm - Regular rate and rhythm. Heart Sounds - S1 WNL and S2 WNL. Murmurs & Other Heart Sounds - Auscultation of the heart reveals - No Murmurs.  Abdomen Palpation/Percussion Tenderness - Abdomen is non-tender to palpation. Rigidity (guarding) - Abdomen is  soft. Auscultation Auscultation of the abdomen reveals - Bowel sounds normal.  Male Genitourinary Note: Not done, not pertinent to present illness  Musculoskeletal Note: He still has tenderness at mediolateral joint line and posteromedially as well as in the patellofemoral joint. He has a midline incision well healed. He has negative anterior drawer. Ipsilateral hip and ankle exam was unremarkable. No DVT. No instability to varus or valgus stress at 0 to 30 degrees.  Imaging Bone scan, Spect scan at [facility] indicated intense tracer activity in the tibial plateau, consistent with posttraumatic activity. Correlation with a CT scan indicated probable compromise of the cyst and degenerative changes of the medial compartment, some activity of the patellofemoral joint.  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 Shelle Iron on 05/14/15. We again discussed the procedure itself as well as risks, complications and alternatives, including but not limited to DVT, PE, infx, bleeding, failure of procedure, need for secondary procedure including manipulation, nerve injury, ongoing pain/symptoms, anesthesia risk, even stroke or death. Also discussed typical post-op protocols, activity restrictions, need for PT, flexion/extension exercises, time out of work. Discussed need for DVT ppx post-op with Xarelto then ASA per protocol. Discussed dental ppx. Also discussed limitations post-operatively such as kneeling and squatting. All questions were answered. Patient desires to proceed with surgery as scheduled. Will hold NSAIDs accordingly. Will remain NPO after MN night before surgery. Will present to [facility] for pre-op testing. Plan Xarelto 2 weeks post-op for DVT ppx then ASA. Plan pain medication, Robaxin, Colace. Plan  home with HHPT post-op with family members at home for assistance. Will follow up 10-14 days post-op for suture/staple removal and xrays.  Plan right knee removal of hardware, total knee replacement  Signed electronically by Dorothy Spark, PA-C for Dr. Shelle Iron

## 2015-05-06 ENCOUNTER — Encounter (HOSPITAL_COMMUNITY)
Admission: RE | Admit: 2015-05-06 | Discharge: 2015-05-06 | Disposition: A | Discharge status: Home / Self Care | Payer: Worker's Compensation | Source: Ambulatory Visit | Attending: Specialist | Admitting: Specialist

## 2015-05-06 ENCOUNTER — Ambulatory Visit (HOSPITAL_COMMUNITY)
Admission: RE | Admit: 2015-05-06 | Discharge: 2015-05-06 | Disposition: A | Discharge status: Home / Self Care | Payer: Worker's Compensation | Source: Ambulatory Visit | Attending: Orthopedic Surgery | Admitting: Orthopedic Surgery

## 2015-05-06 ENCOUNTER — Encounter (HOSPITAL_COMMUNITY): Payer: Self-pay

## 2015-05-06 DIAGNOSIS — Z96651 Presence of right artificial knee joint: Secondary | ICD-10-CM | POA: Diagnosis present

## 2015-05-06 DIAGNOSIS — Z09 Encounter for follow-up examination after completed treatment for conditions other than malignant neoplasm: Secondary | ICD-10-CM | POA: Insufficient documentation

## 2015-05-06 DIAGNOSIS — M1711 Unilateral primary osteoarthritis, right knee: Secondary | ICD-10-CM

## 2015-05-06 HISTORY — DX: Benign prostatic hyperplasia without lower urinary tract symptoms: N40.0

## 2015-05-06 LAB — BASIC METABOLIC PANEL
Anion gap: 8 (ref 5–15)
BUN: 17 mg/dL (ref 6–20)
CHLORIDE: 106 mmol/L (ref 101–111)
CO2: 24 mmol/L (ref 22–32)
CREATININE: 1.58 mg/dL — AB (ref 0.61–1.24)
Calcium: 9.3 mg/dL (ref 8.9–10.3)
GFR calc Af Amer: 52 mL/min — ABNORMAL LOW (ref >60)
GFR calc non Af Amer: 45 mL/min — ABNORMAL LOW (ref >60)
GLUCOSE: 86 mg/dL (ref 65–99)
Potassium: 4.1 mmol/L (ref 3.5–5.1)
Sodium: 138 mmol/L (ref 135–145)

## 2015-05-06 LAB — URINALYSIS, ROUTINE W REFLEX MICROSCOPIC
BILIRUBIN URINE: NEGATIVE
Glucose, UA: NEGATIVE mg/dL
Hgb urine dipstick: NEGATIVE
Ketones, ur: NEGATIVE mg/dL
Leukocytes, UA: NEGATIVE
NITRITE: NEGATIVE
Protein, ur: NEGATIVE mg/dL
SPECIFIC GRAVITY, URINE: 1.023 (ref 1.005–1.030)
pH: 5 (ref 5.0–8.0)

## 2015-05-06 LAB — ABO/RH: ABO/RH(D): A POS

## 2015-05-06 LAB — SURGICAL PCR SCREEN
MRSA, PCR: NEGATIVE
STAPHYLOCOCCUS AUREUS: NEGATIVE

## 2015-05-06 LAB — PROTIME-INR
INR: 1.21 (ref 0.00–1.49)
Prothrombin Time: 15.5 seconds — ABNORMAL HIGH (ref 11.6–15.2)

## 2015-05-06 LAB — CBC
HCT: 45.7 % (ref 39.0–52.0)
Hemoglobin: 15.3 g/dL (ref 13.0–17.0)
MCH: 28.4 pg (ref 26.0–34.0)
MCHC: 33.5 g/dL (ref 30.0–36.0)
MCV: 84.8 fL (ref 78.0–100.0)
PLATELETS: 261 10*3/uL (ref 150–400)
RBC: 5.39 MIL/uL (ref 4.22–5.81)
RDW: 13.9 % (ref 11.5–15.5)
WBC: 9 10*3/uL (ref 4.0–10.5)

## 2015-05-06 NOTE — Progress Notes (Signed)
Abnormal CMET / PT faxed to Dr. Shelle IronBeane

## 2015-05-13 ENCOUNTER — Ambulatory Visit: Payer: Self-pay | Admitting: Orthopedic Surgery

## 2015-05-13 NOTE — Anesthesia Preprocedure Evaluation (Addendum)
Anesthesia Evaluation  Patient identified by MRN, date of birth, ID band Patient awake    Reviewed: Allergy & Precautions, H&P , NPO status , Patient's Chart, lab work & pertinent test results  Airway Mallampati: II  TM Distance: >3 FB Neck ROM: Full    Dental  (+) Teeth Intact   Pulmonary neg pulmonary ROS,    Pulmonary exam normal        Cardiovascular hypertension, negative cardio ROS Normal cardiovascular exam     Neuro/Psych negative neurological ROS  negative psych ROS   GI/Hepatic negative GI ROS, Neg liver ROS,   Endo/Other  negative endocrine ROS  Renal/GU negative Renal ROS  negative genitourinary   Musculoskeletal   Abdominal   Peds  Hematology negative hematology ROS (+)   Anesthesia Other Findings   Reproductive/Obstetrics negative OB ROS                            Anesthesia Physical  Anesthesia Plan  ASA: II  Anesthesia Plan: General   Post-op Pain Management: MAC Combined w/ Regional for Post-op pain   Induction: Intravenous  Airway Management Planned: LMA  Additional Equipment:   Intra-op Plan:   Post-operative Plan: Extubation in OR  Informed Consent: I have reviewed the patients History and Physical, chart, labs and discussed the procedure including the risks, benefits and alternatives for the proposed anesthesia with the patient or authorized representative who has indicated his/her understanding and acceptance.   Dental advisory given  Plan Discussed with: CRNA and Anesthesiologist  Anesthesia Plan Comments:        Anesthesia Quick Evaluation

## 2015-05-13 NOTE — H&P (Signed)
Nicholas Cooke DOB: 09/19/1950 Married / Language: English / Race: Black or African American Male  H&P Date: 05/05/15  Chief Complaint: Right knee pain  History of Present Illness The patient is a 64 year old male who comes in today for a preoperative History and Physical. The patient is scheduled for a right total knee arthroplasty to be performed by Dr. Javier Docker, MD at [facility] on May 14, 2015. Nicholas Cooke is now 40 month(s) out from right knee arthroscopy. The patient is 2 years, 2 months out from injury. He has prior hx of patellofemoral arthroplasty. Symptoms reported today include: pain and swelling. Current treatment includes: bracing and pain medications. The following medication has been used for pain control: Hydrocodone. He is refractory to viscosupplementation injections, bracing, pain medications, quad strengthening, relative rest, and activity modifications. He has had a second opinion by Dr. Luiz Blare. Dr. Luiz Blare felt he would not improve without surgical intervention. He, however, indicated that he was unable to confirm causation in terms of the tibial fracture. Dr. Shelle Iron and the patient mutually agreed to proceed with removal of hardware right knee and right total knee replacement. Risks and benefits of the procedure were discussed including stiffness, suboptimal range of motion, persistent pain, infection requiring removal of prosthesis and reinsertion, need for prophylactic antibiotics in the future, for example, dental procedures, possible need for manipulation, revision in the future and also anesthetic complications including DVT, PE, etc. We discussed the perioperative course, time in the hospital, postoperative recovery and the need for elevation to control swelling. We also discussed the predicted range of motion and the probability that squatting and kneeling would be unobtainable in the future. In addition, postoperative anticoagulation was discussed.  We have obtained preoperative medical clearance as necessary. Provided her illustrated handout and discussed it in detail. They will enroll in the total joint replacement educational forum at the hospital. His [facility] pre-op appt is 12/21.  Allergies  No Known Drug Allergies 03/13/2013  Family History Cancer  father (lung), brother (bone) First Degree Relatives  Cerebrovascular Accident  Mother, Father. mother and father Diabetes Mellitus  mother  Social History Tobacco use  Never smoker. 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 Lisinopril (Oral) Specific strength unknown - Active. Fenofibrate Micronized (Oral) Specific strength unknown - Active. Finasteride (  Tablet, Oral) Active. Imipramine HCl (  Tablet, Oral) Active. Cialis (  Tablet, Oral) Active. Medications Reconciled Ibuprofen (Oral) Specific strength unknown - Active. (prn)  Past Surgical History Rotator Cuff Repair  left Sinus Surgery  Colon Polyp Removal - Colonoscopy  Appendectomy  Inguinal Hernia Repair  open: left Arthroscopy of Knee  right  Past Medical History Diverticulitis Of Colon   Review of Systems General Not Present- Chills, Fatigue, Fever, Memory Loss, Night Sweats, Weight Gain and Weight Loss. Skin Not Present- Eczema, Hives, Itching, Lesions and Rash. HEENT Not Present- Dentures, Double  Vision, Headache, Hearing Loss, Tinnitus and Visual Loss.  Respiratory Not Present- Allergies, Chronic Cough, Coughing up blood, Shortness of breath at rest and Shortness of breath with exertion. Cardiovascular Not Present- Chest Pain, Difficulty Breathing Lying Down, Murmur, Palpitations, Racing/skipping heartbeats and Swelling. Gastrointestinal Not Present- Abdominal Pain, Bloody Stool, Constipation, Diarrhea, Difficulty Swallowing, Heartburn, Jaundice, Loss of appetitie, Nausea and Vomiting. Male Genitourinary Not Present- Blood in Urine, Discharge, Flank Pain, Incontinence, Painful Urination, Urgency, Urinary frequency, Urinary Retention, Urinating at Night and Weak urinary stream. Musculoskeletal Present- Joint Pain and Joint Stiffness. Not Present- Back Pain, Joint Swelling, Morning Stiffness, Muscle Pain, Muscle Weakness and Spasms. Neurological Not Present- Blackout spells, Difficulty with balance, Dizziness, Paralysis, Tremor and Weakness. Psychiatric Not Present- Insomnia.  Physical Exam General Mental Status -Alert, cooperative and good historian. General Appearance-pleasant, Not in acute distress. Orientation-Oriented X3. Build & Nutrition-Well nourished and Well developed.  Head and Neck Head-normocephalic, atraumatic . Neck Global Assessment - supple, no bruit auscultated on the right, no bruit auscultated on the left.  Eye Pupil - Bilateral-Regular and Round. Motion - Bilateral-EOMI.  Chest and Lung Exam Auscultation Breath sounds - clear at anterior chest wall and clear at posterior chest wall. Adventitious sounds - No Adventitious sounds.  Cardiovascular Auscultation Rhythm - Regular rate and rhythm. Heart Sounds - S1 WNL and S2 WNL. Murmurs & Other Heart Sounds - Auscultation of the heart reveals - No Murmurs.  Abdomen Palpation/Percussion Tenderness - Abdomen is non-tender to palpation. Rigidity (guarding) - Abdomen is  soft. Auscultation Auscultation of the abdomen reveals - Bowel sounds normal.  Male Genitourinary Not done, not pertinent to present illness   Musculoskeletal He still has tenderness at mediolateral joint line and posteromedially as well as in the patellofemoral joint. He has a midline incision well healed. He has negative anterior drawer. Ipsilateral hip and ankle exam was unremarkable. No DVT. No instability to varus or valgus stress at 0 to 30 degrees.  Imaging Bone [facility] intense tracer activity in the tibial plateau, consistent with posttraumatic activity. Correlation with a CT scan indicated probable compromise of the cyst and degenerative changes of the medial compartment, some activity of the patellofemoral joint.  Assessment & Plan Localized osteoarthritis of right knee (M17.9)  Pt with loosening R knee PF arthroplasty, tibial plateau fx, and R knee DJD, refractory to conservative tx, scheduled for removal of hardware and right total knee replacement by Dr. Shelle IronBeane on 05/14/15. We again discussed the procedure itself as well as risks, complications and alternatives, including but not limited to DVT, PE, infx, bleeding, failure of procedure, need for secondary procedure including manipulation, nerve injury, ongoing pain/symptoms, anesthesia risk, even stroke or death. Also discussed typical post-op protocols, activity restrictions, need for PT, flexion/extension exercises, time out of work. Discussed need for DVT ppx post-op with Xarelto then ASA per protocol. Discussed dental ppx. Also discussed limitations post-operatively such as kneeling and squatting. All questions were answered. Patient desires to proceed with surgery as scheduled. Will hold NSAIDs accordingly. Will remain NPO after MN night before surgery. [facility]. Plan Xarelto 2 weeks post-op for DVT ppx then ASA. Plan pain medication, Robaxin, Colace. Plan home with HHPT  post-op with family members at home for assistance. Will follow up 10-14 days post-op for suture/staple removal and xrays.  Plan Removal of hardware, right total knee replacement  Signed electronically by Dorothy SparkJaclyn M Bissell, PA-C for Dr. Shelle IronBeane

## 2015-05-14 ENCOUNTER — Inpatient Hospital Stay (HOSPITAL_COMMUNITY): Payer: Worker's Compensation | Admitting: Anesthesiology

## 2015-05-14 ENCOUNTER — Encounter (HOSPITAL_COMMUNITY)
Admission: RE | Disposition: A | Discharge status: Home Health | Payer: Self-pay | Source: Ambulatory Visit | Attending: Specialist

## 2015-05-14 ENCOUNTER — Inpatient Hospital Stay (HOSPITAL_COMMUNITY): Payer: Worker's Compensation

## 2015-05-14 ENCOUNTER — Encounter (HOSPITAL_COMMUNITY): Payer: Self-pay

## 2015-05-14 ENCOUNTER — Inpatient Hospital Stay (HOSPITAL_COMMUNITY)
Admission: RE | Admit: 2015-05-14 | Discharge: 2015-05-16 | DRG: 468 | Disposition: A | Discharge status: Home Health | Payer: Worker's Compensation | Source: Ambulatory Visit | Attending: Specialist | Admitting: Specialist

## 2015-05-14 DIAGNOSIS — I10 Essential (primary) hypertension: Secondary | ICD-10-CM | POA: Diagnosis present

## 2015-05-14 DIAGNOSIS — Z96651 Presence of right artificial knee joint: Secondary | ICD-10-CM

## 2015-05-14 DIAGNOSIS — M85661 Other cyst of bone, right lower leg: Secondary | ICD-10-CM | POA: Diagnosis present

## 2015-05-14 DIAGNOSIS — M1711 Unilateral primary osteoarthritis, right knee: Principal | ICD-10-CM | POA: Diagnosis present

## 2015-05-14 DIAGNOSIS — M25561 Pain in right knee: Secondary | ICD-10-CM | POA: Diagnosis present

## 2015-05-14 HISTORY — PX: TOTAL KNEE ARTHROPLASTY: SHX125

## 2015-05-14 LAB — TYPE AND SCREEN
ABO/RH(D): A POS
ANTIBODY SCREEN: NEGATIVE

## 2015-05-14 SURGERY — ARTHROPLASTY, KNEE, TOTAL
Anesthesia: General | Site: Knee | Laterality: Right

## 2015-05-14 MED ORDER — CEFAZOLIN SODIUM-DEXTROSE 2-3 GM-% IV SOLR
2.0000 g | INTRAVENOUS | Status: AC
  Administered 2015-05-14: 2 g via INTRAVENOUS

## 2015-05-14 MED ORDER — MENTHOL 3 MG MT LOZG: 1.0000 | LOZENGE | OROMUCOSAL | Status: DC | PRN

## 2015-05-14 MED ORDER — MIDAZOLAM HCL 5 MG/5ML IJ SOLN
INTRAMUSCULAR | Status: DC | PRN
  Administered 2015-05-14: 2 mg via INTRAVENOUS

## 2015-05-14 MED ORDER — OXYCODONE-ACETAMINOPHEN 5-325 MG PO TABS: 1.0000 | ORAL_TABLET | ORAL | Status: DC | PRN

## 2015-05-14 MED ORDER — DIPHENHYDRAMINE HCL 12.5 MG/5ML PO ELIX
12.5000 mg | ORAL_SOLUTION | ORAL | Status: DC | PRN
Start: 2015-05-14 — End: 2015-05-16

## 2015-05-14 MED ORDER — METOCLOPRAMIDE HCL 5 MG/ML IJ SOLN: 5.0000 mg | Freq: Three times a day (TID) | INTRAMUSCULAR | Status: DC | PRN

## 2015-05-14 MED ORDER — FENTANYL CITRATE (PF) 250 MCG/5ML IJ SOLN
INTRAMUSCULAR | Status: AC
  Filled 2015-05-14: qty 5

## 2015-05-14 MED ORDER — SENNOSIDES-DOCUSATE SODIUM 8.6-50 MG PO TABS
1.0000 | ORAL_TABLET | Freq: Every evening | ORAL | Status: DC | PRN
Start: 2015-05-14 — End: 2015-05-16

## 2015-05-14 MED ORDER — PROMETHAZINE HCL 25 MG/ML IJ SOLN: 6.2500 mg | INTRAMUSCULAR | Status: DC | PRN

## 2015-05-14 MED ORDER — DOCUSATE SODIUM 100 MG PO CAPS: 100.0000 mg | ORAL_CAPSULE | Freq: Two times a day (BID) | ORAL | Status: DC | PRN

## 2015-05-14 MED ORDER — DEXAMETHASONE SODIUM PHOSPHATE 10 MG/ML IJ SOLN
INTRAMUSCULAR | Status: DC | PRN
  Administered 2015-05-14: 10 mg via INTRAVENOUS

## 2015-05-14 MED ORDER — ONDANSETRON HCL 4 MG/2ML IJ SOLN: 4.0000 mg | Freq: Four times a day (QID) | INTRAMUSCULAR | Status: DC | PRN

## 2015-05-14 MED ORDER — FENOFIBRATE 160 MG PO TABS
160.0000 mg | ORAL_TABLET | Freq: Every day | ORAL | Status: DC
  Administered 2015-05-15 – 2015-05-16 (×2): 160 mg via ORAL
  Filled 2015-05-14 (×2): qty 1

## 2015-05-14 MED ORDER — PHENYLEPHRINE HCL 10 MG/ML IJ SOLN
INTRAMUSCULAR | Status: DC | PRN
  Administered 2015-05-14 (×6): 80 ug via INTRAVENOUS

## 2015-05-14 MED ORDER — HYDROMORPHONE HCL 1 MG/ML IJ SOLN
INTRAMUSCULAR | Status: AC
  Filled 2015-05-14: qty 1

## 2015-05-14 MED ORDER — PROPOFOL 10 MG/ML IV BOLUS
INTRAVENOUS | Status: AC
  Filled 2015-05-14: qty 20

## 2015-05-14 MED ORDER — PROPOFOL 10 MG/ML IV BOLUS
INTRAVENOUS | Status: DC | PRN
  Administered 2015-05-14: 200 mg via INTRAVENOUS

## 2015-05-14 MED ORDER — SUCCINYLCHOLINE CHLORIDE 20 MG/ML IJ SOLN
INTRAMUSCULAR | Status: DC | PRN
  Administered 2015-05-14: 100 mg via INTRAVENOUS

## 2015-05-14 MED ORDER — RIVAROXABAN 10 MG PO TABS: 10.0000 mg | ORAL_TABLET | Freq: Every day | ORAL | Status: DC

## 2015-05-14 MED ORDER — LORATADINE 10 MG PO TABS
10.0000 mg | ORAL_TABLET | Freq: Every day | ORAL | Status: DC | PRN
  Filled 2015-05-14: qty 1

## 2015-05-14 MED ORDER — MAGNESIUM CITRATE PO SOLN: 1.0000 | Freq: Once | ORAL | Status: DC | PRN

## 2015-05-14 MED ORDER — POLYETHYL GLYCOL-PROPYL GLYCOL 0.4-0.3 % OP GEL
Freq: Every day | OPHTHALMIC | Status: DC | PRN
  Filled 2015-05-14: qty 10

## 2015-05-14 MED ORDER — HYDROMORPHONE HCL 1 MG/ML IJ SOLN
0.2500 mg | INTRAMUSCULAR | Status: DC | PRN
  Administered 2015-05-14 (×4): 0.5 mg via INTRAVENOUS

## 2015-05-14 MED ORDER — IMIPRAMINE HCL 25 MG PO TABS
25.0000 mg | ORAL_TABLET | Freq: Every day | ORAL | Status: DC
  Administered 2015-05-14 – 2015-05-15 (×2): 25 mg via ORAL
  Filled 2015-05-14 (×3): qty 1

## 2015-05-14 MED ORDER — LACTATED RINGERS IV SOLN
INTRAVENOUS | Status: DC
  Administered 2015-05-14 (×3): via INTRAVENOUS

## 2015-05-14 MED ORDER — SODIUM CHLORIDE 0.9 % IR SOLN
Status: DC | PRN
  Administered 2015-05-14: 300 mL

## 2015-05-14 MED ORDER — RISAQUAD PO CAPS
1.0000 | ORAL_CAPSULE | Freq: Every day | ORAL | Status: DC
  Administered 2015-05-14 – 2015-05-16 (×3): 1 via ORAL
  Filled 2015-05-14 (×3): qty 1

## 2015-05-14 MED ORDER — DEXAMETHASONE SODIUM PHOSPHATE 10 MG/ML IJ SOLN
INTRAMUSCULAR | Status: AC
  Filled 2015-05-14: qty 1

## 2015-05-14 MED ORDER — ONDANSETRON HCL 4 MG PO TABS: 4.0000 mg | ORAL_TABLET | Freq: Four times a day (QID) | ORAL | Status: DC | PRN

## 2015-05-14 MED ORDER — ALUM & MAG HYDROXIDE-SIMETH 200-200-20 MG/5ML PO SUSP: 30.0000 mL | ORAL | Status: DC | PRN

## 2015-05-14 MED ORDER — BUPIVACAINE-EPINEPHRINE (PF) 0.25% -1:200000 IJ SOLN
INTRAMUSCULAR | Status: AC
  Filled 2015-05-14: qty 30

## 2015-05-14 MED ORDER — ONDANSETRON HCL 4 MG/2ML IJ SOLN
INTRAMUSCULAR | Status: AC
  Filled 2015-05-14: qty 2

## 2015-05-14 MED ORDER — METHOCARBAMOL 500 MG PO TABS: 500.0000 mg | ORAL_TABLET | Freq: Three times a day (TID) | ORAL | Status: DC | PRN

## 2015-05-14 MED ORDER — METOCLOPRAMIDE HCL 10 MG PO TABS: 5.0000 mg | ORAL_TABLET | Freq: Three times a day (TID) | ORAL | Status: DC | PRN

## 2015-05-14 MED ORDER — CEFAZOLIN SODIUM-DEXTROSE 2-3 GM-% IV SOLR
INTRAVENOUS | Status: AC
  Filled 2015-05-14: qty 50

## 2015-05-14 MED ORDER — LISINOPRIL 10 MG PO TABS: 10.0000 mg | ORAL_TABLET | Freq: Every day | ORAL | Status: DC

## 2015-05-14 MED ORDER — BISACODYL 5 MG PO TBEC: 5.0000 mg | DELAYED_RELEASE_TABLET | Freq: Every day | ORAL | Status: DC | PRN

## 2015-05-14 MED ORDER — SCOPOLAMINE 1 MG/3DAYS TD PT72
1.0000 | MEDICATED_PATCH | TRANSDERMAL | Status: DC
  Administered 2015-05-14: 1.5 mg via TRANSDERMAL
  Filled 2015-05-14: qty 1

## 2015-05-14 MED ORDER — ONDANSETRON HCL 4 MG/2ML IJ SOLN
INTRAMUSCULAR | Status: DC | PRN
  Administered 2015-05-14: 4 mg via INTRAVENOUS

## 2015-05-14 MED ORDER — DOCUSATE SODIUM 100 MG PO CAPS
100.0000 mg | ORAL_CAPSULE | Freq: Two times a day (BID) | ORAL | Status: DC
  Administered 2015-05-14 – 2015-05-16 (×5): 100 mg via ORAL

## 2015-05-14 MED ORDER — RIVAROXABAN 10 MG PO TABS
10.0000 mg | ORAL_TABLET | Freq: Every day | ORAL | Status: DC
  Administered 2015-05-15 – 2015-05-16 (×2): 10 mg via ORAL
  Filled 2015-05-14 (×3): qty 1

## 2015-05-14 MED ORDER — METHOCARBAMOL 1000 MG/10ML IJ SOLN
500.0000 mg | Freq: Four times a day (QID) | INTRAVENOUS | Status: DC | PRN
  Filled 2015-05-14: qty 5

## 2015-05-14 MED ORDER — PHENYLEPHRINE 40 MCG/ML (10ML) SYRINGE FOR IV PUSH (FOR BLOOD PRESSURE SUPPORT)
PREFILLED_SYRINGE | INTRAVENOUS | Status: AC
  Filled 2015-05-14: qty 10

## 2015-05-14 MED ORDER — MIDAZOLAM HCL 2 MG/2ML IJ SOLN
INTRAMUSCULAR | Status: AC
  Filled 2015-05-14: qty 2

## 2015-05-14 MED ORDER — OXYCODONE HCL 5 MG PO TABS
5.0000 mg | ORAL_TABLET | ORAL | Status: DC | PRN
  Administered 2015-05-14 (×2): 10 mg via ORAL
  Administered 2015-05-14: 5 mg via ORAL
  Administered 2015-05-14 – 2015-05-16 (×12): 10 mg via ORAL
  Filled 2015-05-14 (×5): qty 2
  Filled 2015-05-14: qty 1
  Filled 2015-05-14 (×10): qty 2

## 2015-05-14 MED ORDER — ACETAMINOPHEN 650 MG RE SUPP: 650.0000 mg | Freq: Four times a day (QID) | RECTAL | Status: DC | PRN

## 2015-05-14 MED ORDER — LIDOCAINE HCL (CARDIAC) 20 MG/ML IV SOLN
INTRAVENOUS | Status: DC | PRN
  Administered 2015-05-14: 100 mg via INTRAVENOUS

## 2015-05-14 MED ORDER — ROCURONIUM BROMIDE 100 MG/10ML IV SOLN
INTRAVENOUS | Status: AC
  Filled 2015-05-14: qty 1

## 2015-05-14 MED ORDER — BUPIVACAINE-EPINEPHRINE 0.25% -1:200000 IJ SOLN
INTRAMUSCULAR | Status: DC | PRN
  Administered 2015-05-14: 60 mL

## 2015-05-14 MED ORDER — KCL IN DEXTROSE-NACL 20-5-0.45 MEQ/L-%-% IV SOLN
INTRAVENOUS | Status: AC
  Administered 2015-05-14: 12:00:00 via INTRAVENOUS
  Filled 2015-05-14 (×2): qty 1000

## 2015-05-14 MED ORDER — SUGAMMADEX SODIUM 200 MG/2ML IV SOLN
INTRAVENOUS | Status: DC | PRN
  Administered 2015-05-14: 300 mg via INTRAVENOUS

## 2015-05-14 MED ORDER — METHOCARBAMOL 500 MG PO TABS
500.0000 mg | ORAL_TABLET | Freq: Four times a day (QID) | ORAL | Status: DC | PRN
Start: 2015-05-14 — End: 2015-05-16
  Administered 2015-05-14 – 2015-05-15 (×4): 500 mg via ORAL
  Filled 2015-05-14 (×4): qty 1

## 2015-05-14 MED ORDER — CEFAZOLIN SODIUM-DEXTROSE 2-3 GM-% IV SOLR
2.0000 g | Freq: Four times a day (QID) | INTRAVENOUS | Status: AC
  Administered 2015-05-14 – 2015-05-15 (×3): 2 g via INTRAVENOUS
  Filled 2015-05-14 (×3): qty 50

## 2015-05-14 MED ORDER — FENTANYL CITRATE (PF) 100 MCG/2ML IJ SOLN
INTRAMUSCULAR | Status: DC | PRN
Start: 2015-05-14 — End: 2015-05-14
  Administered 2015-05-14 (×2): 50 ug via INTRAVENOUS

## 2015-05-14 MED ORDER — SODIUM CHLORIDE 0.9 % IR SOLN
Status: DC | PRN
  Administered 2015-05-14: 500 mL

## 2015-05-14 MED ORDER — FINASTERIDE 5 MG PO TABS
5.0000 mg | ORAL_TABLET | Freq: Every day | ORAL | Status: DC
  Administered 2015-05-15 – 2015-05-16 (×2): 5 mg via ORAL
  Filled 2015-05-14 (×2): qty 1

## 2015-05-14 MED ORDER — ROCURONIUM BROMIDE 100 MG/10ML IV SOLN
INTRAVENOUS | Status: DC | PRN
  Administered 2015-05-14 (×2): 10 mg via INTRAVENOUS
  Administered 2015-05-14: 40 mg via INTRAVENOUS

## 2015-05-14 MED ORDER — HYDROMORPHONE HCL 1 MG/ML IJ SOLN
1.0000 mg | INTRAMUSCULAR | Status: DC | PRN
  Administered 2015-05-14 – 2015-05-15 (×4): 1 mg via INTRAVENOUS
  Filled 2015-05-14 (×4): qty 1

## 2015-05-14 MED ORDER — POLYMYXIN B SULFATE 500000 UNITS IJ SOLR
INTRAMUSCULAR | Status: AC
  Filled 2015-05-14: qty 1

## 2015-05-14 MED ORDER — ACETAMINOPHEN 325 MG PO TABS
650.0000 mg | ORAL_TABLET | Freq: Four times a day (QID) | ORAL | Status: DC | PRN
Start: 2015-05-14 — End: 2015-05-16

## 2015-05-14 MED ORDER — PHENOL 1.4 % MT LIQD
1.0000 | OROMUCOSAL | Status: DC | PRN
  Filled 2015-05-14: qty 177

## 2015-05-14 SURGICAL SUPPLY — 65 items
BAG SPEC THK2 15X12 ZIP CLS (MISCELLANEOUS)
BAG ZIPLOCK 12X15 (MISCELLANEOUS) IMPLANT
BANDAGE ACE 4X5 VEL STRL LF (GAUZE/BANDAGES/DRESSINGS) ×2 IMPLANT
BANDAGE ACE 6X5 VEL STRL LF (GAUZE/BANDAGES/DRESSINGS) ×2 IMPLANT
BLADE SAG 18X100X1.27 (BLADE) ×2 IMPLANT
BLADE SAW SGTL 13.0X1.19X90.0M (BLADE) ×2 IMPLANT
CAP KNEE TOTAL 3 SIGMA ×1 IMPLANT
CEMENT HV SMART SET (Cement) ×4 IMPLANT
CLOTH 2% CHLOROHEXIDINE 3PK (PERSONAL CARE ITEMS) ×2 IMPLANT
CUFF TOURN SGL QUICK 34 (TOURNIQUET CUFF) ×2
CUFF TRNQT CYL 34X4X40X1 (TOURNIQUET CUFF) ×1 IMPLANT
DECANTER SPIKE VIAL GLASS SM (MISCELLANEOUS) ×2 IMPLANT
DRAPE INCISE IOBAN 66X45 STRL (DRAPES) IMPLANT
DRAPE LG THREE QUARTER DISP (DRAPES) ×2 IMPLANT
DRAPE ORTHO SPLIT 77X108 STRL (DRAPES) ×4
DRAPE SURG ORHT 6 SPLT 77X108 (DRAPES) ×2 IMPLANT
DRAPE U-SHAPE 47X51 STRL (DRAPES) ×2 IMPLANT
DRSG AQUACEL AG ADV 3.5X10 (GAUZE/BANDAGES/DRESSINGS) ×1 IMPLANT
DRSG TEGADERM 4X4.75 (GAUZE/BANDAGES/DRESSINGS) IMPLANT
DURAPREP 26ML APPLICATOR (WOUND CARE) ×2 IMPLANT
ELECT REM PT RETURN 9FT ADLT (ELECTROSURGICAL) ×2
ELECTRODE REM PT RTRN 9FT ADLT (ELECTROSURGICAL) ×1 IMPLANT
EVACUATOR 1/8 PVC DRAIN (DRAIN) IMPLANT
FACESHIELD WRAPAROUND (MASK) ×12 IMPLANT
FACESHIELD WRAPAROUND OR TEAM (MASK) IMPLANT
GAUZE SPONGE 2X2 8PLY STRL LF (GAUZE/BANDAGES/DRESSINGS) IMPLANT
GLOVE BIOGEL PI IND STRL 7.0 (GLOVE) ×1 IMPLANT
GLOVE BIOGEL PI IND STRL 8 (GLOVE) ×1 IMPLANT
GLOVE BIOGEL PI INDICATOR 7.0 (GLOVE) ×1
GLOVE BIOGEL PI INDICATOR 8 (GLOVE) ×1
GLOVE SURG SS PI 7.0 STRL IVOR (GLOVE) ×2 IMPLANT
GLOVE SURG SS PI 7.5 STRL IVOR (GLOVE) ×1 IMPLANT
GLOVE SURG SS PI 8.0 STRL IVOR (GLOVE) ×4 IMPLANT
GOWN STRL REUS W/TWL XL LVL3 (GOWN DISPOSABLE) ×4 IMPLANT
HANDPIECE INTERPULSE COAX TIP (DISPOSABLE) ×2
IMMOBILIZER KNEE 20 (SOFTGOODS) ×2
IMMOBILIZER KNEE 20 THIGH 36 (SOFTGOODS) ×1 IMPLANT
IMMOBILIZER KNEE 22 (SOFTGOODS) ×1 IMPLANT
MANIFOLD NEPTUNE II (INSTRUMENTS) ×2 IMPLANT
NS IRRIG 1000ML POUR BTL (IV SOLUTION) IMPLANT
PACK TOTAL KNEE CUSTOM (KITS) ×2 IMPLANT
POSITIONER SURGICAL ARM (MISCELLANEOUS) ×2 IMPLANT
SET HNDPC FAN SPRY TIP SCT (DISPOSABLE) ×1 IMPLANT
SPONGE GAUZE 2X2 STER 10/PKG (GAUZE/BANDAGES/DRESSINGS)
SPONGE SURGIFOAM ABS GEL 100 (HEMOSTASIS) IMPLANT
STAPLER VISISTAT (STAPLE) ×1 IMPLANT
STRIP CLOSURE SKIN 1/2X4 (GAUZE/BANDAGES/DRESSINGS) IMPLANT
SUCTION FRAZIER HANDLE 12FR (TUBING) ×1
SUCTION TUBE FRAZIER 12FR DISP (TUBING) IMPLANT
SUT BONE WAX W31G (SUTURE) ×1 IMPLANT
SUT MNCRL AB 4-0 PS2 18 (SUTURE) IMPLANT
SUT STRATAFIX 0 PDS 27 VIOLET (SUTURE) ×2
SUT VIC AB 1 CT1 27 (SUTURE) ×8
SUT VIC AB 1 CT1 27XBRD ANTBC (SUTURE) ×2 IMPLANT
SUT VIC AB 2-0 CT1 27 (SUTURE) ×6
SUT VIC AB 2-0 CT1 TAPERPNT 27 (SUTURE) ×3 IMPLANT
SUT VLOC 180 0 24IN GS25 (SUTURE) ×1 IMPLANT
SUTURE STRATFX 0 PDS 27 VIOLET (SUTURE) IMPLANT
SYR 50ML LL SCALE MARK (SYRINGE) ×2 IMPLANT
TOWER CARTRIDGE SMART MIX (DISPOSABLE) ×2 IMPLANT
TRAY FOLEY W/METER SILVER 14FR (SET/KITS/TRAYS/PACK) ×1 IMPLANT
TRAY FOLEY W/METER SILVER 16FR (SET/KITS/TRAYS/PACK) ×2 IMPLANT
WATER STERILE IRR 1500ML POUR (IV SOLUTION) ×2 IMPLANT
WRAP KNEE MAXI GEL POST OP (GAUZE/BANDAGES/DRESSINGS) ×2 IMPLANT
YANKAUER SUCT BULB TIP 10FT TU (MISCELLANEOUS) ×2 IMPLANT

## 2015-05-14 NOTE — Op Note (Signed)
NAME:  Nicholas Cooke, Nicholas Cooke NO.:  [id number]  MEDICAL RECORD NO.:  [id number]  LOCATION:  1608                         FACILITY:  [facility]  PHYSICIAN:  Jene Every, M.D.    DATE OF BIRTH:  08/29/1950  DATE OF PROCEDURE:  05/14/2015 DATE OF DISCHARGE:                              OPERATIVE REPORT   PREOPERATIVE DIAGNOSES:  Degenerative joint disease, tibial bone cyst, and painful patellofemoral arthroplasty.  POSTOPERATIVE DIAGNOSES:  Degenerative joint disease, tibial bone cyst, and painful patellofemoral arthroplasty.  PROCEDURE PERFORMED: 1. Removal of patellofemoral arthroplasty, femoral component. 2. Right total knee replacement utilizing DePuy rotating platform, 4     femur, 4 tibia, 10 mm insert. 3. Excision of tibial bone cyst.  ANESTHESIA:  General.  ASSISTANT:  Lanna Poche, PA  HISTORY:  This is a 64 year old male who fell at work, had persistent painful right knee status post a patellofemoral arthroplasty.  He had a bone scan which indicated intense activity in the proximal tibia suggesting a fracture through a tibial cyst.  A previous patellofemoral arthroplasty that showed some slight increase in activity, but the predominance was the tibia, it was felt that this would require excision of this cyst and bone cement, therefore a total knee replacement removing the previous patellofemoral arthroplasty.  Risks and benefits were discussed including bleeding, infection, damage to the neurovascular structures, inability to revise femur or the patella, DVT, PE, fracture, suboptimal range of motion, etc.  TECHNIQUE:  With the patient in supine position, after induction of adequate general anesthesia 2 g Kefzol, the right lower extremity was prepped and draped and exsanguinated in usual sterile fashion.  Thigh tourniquet inflated to 275 mmHg.  Midline incision was made in the previous incision, removed that portion of the wide scar proximally,  but not distally trying to maximize his closure.  Full-thickness flaps developed.  Median parapatellar arthrotomy performed.  He had a previous tibial tubercle osteotomy.  We elevated soft tissues medially preserving the MCL.  Patella was gently everted.  Knee flexed.  Osteoarthrosis in the tibial medial compartment was noted grade 4 specially posteriorly bilaterally.  There were some grade 3-4 changes of the posterior aspect of medial femoral condyle with tibial component was overgrown with a scar tissue, but showed minimal wear.  We excised some of the scar tissue from around that.  It appeared very stable.  Next, remnants of medial and lateral menisci were removed with a rongeur.  We then removed the femoral component with a 3 mm hex screw 2 medially, 2 laterally. The patellofemoral arthroplasty of the femoral component removed quite easily.  I then used a step drill to enter the femoral canal.  It was irrigated.  A 5-degree right was selected with 11 off the distal femur due to a slight flexion contracture.  This was then pinned and we performed our anterior and posterior cuts.  It was measured at just above the 4, we pinned it, and then dropped our blocks by 2 mm without notching the cortex.  Before this, distal femoral cut was then performed.  Again, we sized off the anterior aspect of the femur, 3 degrees of external rotation, placed our block after it was sized to a 4.  Performed a provisional anterior cut and felt we could lower that which we did by 2 mm.  We then re-cut anteriorly.  Posteriorly, we cut medially and laterally off the posterior femoral condyles.  Protecting the soft tissues at all times, then the anterior and posterior chamfer cuts.  We did not notch the cortex.  This cut was satisfactory, turned our attention towards the tibia, was subluxed with Catha Gosselin.  Then, we used a retractor laterally.  Care was taken to not avulse the patellar tendon throughout the case.   We did release some plica that were lateralizing the patella.  Next, we measured the external alignment guide, 3 degrees slope bisecting the tibiotalar joint.  He had minimal subtalar motion and slight supination of his foot, bisected the tibiotalar joint.  Two of the defect was posteromedially.  We performed our cut.  After pinning the external alignment guide, again it was teared parallel to tibial shaft.  We then tried our flexion extension gaps, they were tight both in flexion extension.  We tried actually 2 and then an additional 2 mm cuts sequentially to achieve the maximum flexion extension gaps.  This exposed the posterior medial portion of the tibia, a large cyst measuring 1 x 1 cm and cm and a half in depth. We curetted the depth of this with a curette and sent the cyst for pathology.  There was a portion of that canal posteriorly and cephalad. After making our revision cuts, we were able to remove any residual cartilage on the knee.  We continued to use the external alignment guide and also used a base tray with a rod to look at our slope and our alignment.  Following this, the flexion and extension gaps were equivalent with a 10 mm insert.  We then moved towards completing the tibia, was subluxed, and we measured it to a 4.  Distal medial aspect of tibial tubercle full coverage.  We used our central drill guide.  The punch guide was not available and we had to await some additional time to get a 2nd one.  In the interim, we performed a box cut on the femur, just bisecting the notch.  We performed our box cut, without difficulty and then returned back to the tibia and completed our punch guide, this in the appropriate version.  We then placed our trial femur and 10 mm insert.  We had full extension, full flexion, good stability, varus and valgus stressing at 0 and 30 degrees.  We had excellent patellofemoral tracking.  Next, we then used our pulsatile lavage.  We removed  all trials, checked posteriorly, and cauterized any of the geniculates. Popliteus was intact.  The PCL had been resected.  We used pulsatile lavage, delivered clean overall cancellous surfaces and revisited the patella, again some hypertrophic synovium was excised there but the patella had been previously recessed and a portion of the proximal ridge of the patella was then similarly connected to the quadriceps tendon, therefore, we did not want to excise this for fear of compromise, so that we would not compromise the quad tendon.  The patella was again solid.  Minimal aware we elected to were keep it.  After pulsatile lavage, flexing the knee, subluxing it, we then mixed the cement on back table in appropriate fashion and injected into the tibial canal and into the confines of the cyst impact digitally pressurizing that we took a 4 tray and impacted it with redundant cement removed.  We then cemented the  femoral component, redundant cement removed.  Placed a trial 10 insert, reduced and held it with an axial load throughout the curing of the cement with redundant cement removed.  A 0.25% Marcaine with epinephrine was infiltrated in the periarticular tissues.  After appropriate curing of the cement, we then trialed, we had excellent extension and flexion and good stability varus and valgus stressing at 0- 30 degrees.  I removed the 10 insert and meticulously removed all and any redundant cement, copiously irrigated the wound, placed our permanent 4 insert, reduced it, again full extension and full flexion 140, good stability with varus valgus stressing 0-30 degrees and excellent patellofemoral tracking and a negative anterior drawer.  I then released the tourniquet at 1 hour 15 minutes and cauterized any vessels.  There was minimal bleeding.  We therefore did not select the drain.  Next, we then reapproximated the patellar arthrotomy with the knee in slight flexion with #1 Vicryl  interrupted figure-of-eight sutures and closed our arthrotomy proximally and distally and then oversewn it with a running V-Loc type suture.  Following this, we had flexion to gravity at 90 degrees.  Negative anterior drawer.  Excellent patellofemoral tracking and good stability.  Next, the subcu closed with 2-0 and the skin with staples due to his previous surgical incisions, but did not excise the previous scar as that created a tighter closure. The tourniquet was deflated.  There was adequate revascularization of lower extremity appreciated.  The patient tolerated the procedure well.  There were no complications. Again excellent patellofemoral tracking.  Sterile dressing applied, placed in an immobilizer, extubated without difficulty, and transported to the recovery room in satisfactory condition.  The patient tolerated the procedure well.  No complications.  Minimal blood loss.     Jene EveryJeffrey Mercadez Heitman, M.D.     Cordelia PenJB/MEDQ  D:  05/14/2015  T:  05/14/2015  Job:  [id number]

## 2015-05-14 NOTE — Progress Notes (Signed)
CSW consulted for SNF placement. PN reviewed. PT recommends HHPT at d/c. RNCM is assisting with d/c planning.  Cori RazorJamie Kyndel Egger LCSW [phone]

## 2015-05-14 NOTE — H&P (View-Only) (Signed)
Nicholas FieldWilliam L Cooke DOB: 04-Jan-1951 Married / Language: English / Race: Black or African American Male  H&P Date: 05/05/15  Chief Complaint: R knee pain  History of Present Illness The patient is a 64 year old male who comes in today for a preoperative History and Physical. The patient is scheduled for a right total knee arthroplasty to be performed by Dr. Javier DockerJeffrey C. Beane, [facility] on May 14, 2015. Nicholas Cooke is now 8418 month(s) out from right knee arthroscopy. The patient is 2 years, 2 months out from injury. He has prior hx of patellofemoral arthroplasty. Symptoms reported today include: pain and swelling. Current treatment includes: bracing and pain medications. The following medication has been used for pain control: Hydrocodone. He is refractory to viscosupplementation injections, bracing, pain medications, quad strengthening, relative rest, and activity modifications. He has had a second opinion by Dr. Luiz BlareGraves. Dr. Luiz BlareGraves felt he would not improve without surgical intervention. He, however, indicated that he was unable to confirm causation in terms of the tibial fracture. Dr. Shelle IronBeane and the patient mutually agreed to proceed with removal of hardware right knee and right total knee replacement. Risks and benefits of the procedure were discussed including stiffness, suboptimal range of motion, persistent pain, infection requiring removal of prosthesis and reinsertion, need for prophylactic antibiotics in the future, for example, dental procedures, possible need for manipulation, revision in the future and also anesthetic complications including DVT, PE, etc. We discussed the perioperative course, time in the hospital, postoperative recovery and the need for elevation to control swelling. We also discussed the predicted range of motion and the probability that squatting and kneeling would be unobtainable in the future. In addition, postoperative anticoagulation was discussed. We  have obtained preoperative medical clearance as necessary. Provided her illustrated handout and discussed it in detail. They will enroll in the total joint replacement educational forum at the hospital. His [facility] pre-op appt is tomorrow 12/21.  Allergies  No Known Drug Allergies 03/13/2013  Family History Cancer  father (lung), brother (bone) First Degree Relatives  Cerebrovascular Accident  Mother, Father. mother and father Diabetes Mellitus  mother  Social History Tobacco use  Never smoker. never smoker Illicit drug use  no Drug/Alcohol Rehab (Previously)  no Living situation  live with spouse, 2 story home with 1 step to enter Exercise  Exercises weekly; does running / walking and gym / weights Drug/Alcohol Rehab (Currently)  no Alcohol use  former drinker Marital status  married Children  4 Tobacco / smoke exposure  no Number of flights of stairs before winded  4-5 Current work status  working full time [employer]ives  none Post-Surgical Plans  home with HHPT, plans to sleep in living room on first floor  Medication History Hydrocodone-Acetaminophen (Oral) Specific strength unknown - Active. Lisinopril (Oral) Specific strength unknown - Active. Fenofibrate Micronized (Oral) Specific strength unknown - Active. Finasteride (5MG  Tablet, Oral) Active. Imipramine HCl (25MG  Tablet, Oral) Active. Cialis (5MG  Tablet, Oral) Active. Medications Reconciled Ibuprofen (Oral) Specific strength unknown - Active. (prn)  Past Surgical History Rotator Cuff Repair  left Sinus Surgery  Colon Polyp Removal - Colonoscopy  Appendectomy  Inguinal Hernia Repair  open: left Arthroscopy of Knee  right  Past Medical Hx Diverticulitis Of Colon  Hiatal Hernia  Review of Systems General Not Present- Chills, Fatigue, Fever, Memory Loss, Night Sweats, Weight Gain and Weight Loss. Skin Not Present- Eczema, Hives, Itching, Lesions and Rash. HEENT Not Present-  Dentures, Double Vision, Headache, Hearing Loss, Tinnitus  and Visual Loss. Respiratory Not Present- Allergies, Chronic Cough, Coughing up blood, Shortness of breath at rest and Shortness of breath with exertion. Cardiovascular Not Present- Chest Pain, Difficulty Breathing Lying Down, Murmur, Palpitations, Racing/skipping heartbeats and Swelling. Gastrointestinal Not Present- Abdominal Pain, Bloody Stool, Constipation, Diarrhea, Difficulty Swallowing, Heartburn, Jaundice, Loss of appetitie, Nausea and Vomiting. Male Genitourinary Not Present- Blood in Urine, Discharge, Flank Pain, Incontinence, Painful Urination, Urgency, Urinary frequency, Urinary Retention, Urinating at Night and Weak urinary stream. Musculoskeletal Present- Joint Pain and Joint Stiffness. Not Present- Back Pain, Joint Swelling, Morning Stiffness, Muscle Pain, Muscle Weakness and Spasms. Neurological Not Present- Blackout spells, Difficulty with balance, Dizziness, Paralysis, Tremor and Weakness. Psychiatric Not Present- Insomnia.  Physical Exam General Mental Status -Alert, cooperative and good historian. General Appearance-pleasant, Not in acute distress. Orientation-Oriented X3. Build & Nutrition-Well nourished and Well developed.  Head and Neck Head-normocephalic, atraumatic . Neck Global Assessment - supple, no bruit auscultated on the right, no bruit auscultated on the left.  Eye Pupil - Bilateral-Regular and Round. Motion - Bilateral-EOMI.  Chest and Lung Exam Auscultation Breath sounds - clear at anterior chest wall and clear at posterior chest wall. Adventitious sounds - No Adventitious sounds.  Cardiovascular Auscultation Rhythm - Regular rate and rhythm. Heart Sounds - S1 WNL and S2 WNL. Murmurs & Other Heart Sounds - Auscultation of the heart reveals - No Murmurs.  Abdomen Palpation/Percussion Tenderness - Abdomen is non-tender to palpation. Rigidity (guarding) - Abdomen is  soft. Auscultation Auscultation of the abdomen reveals - Bowel sounds normal.  Male Genitourinary Note: Not done, not pertinent to present illness  Musculoskeletal Note: He still has tenderness at mediolateral joint line and posteromedially as well as in the patellofemoral joint. He has a midline incision well healed. He has negative anterior drawer. Ipsilateral hip and ankle exam was unremarkable. No DVT. No instability to varus or valgus stress at 0 to 30 degrees.  Imaging Bone scan, Spect scan at [place] indicated intense tracer activity in the tibial plateau, consistent with posttraumatic activity. Correlation with a CT scan indicated probable compromise of the cyst and degenerative changes of the medial compartment, some activity of the patellofemoral joint.  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 Beane on 05/14/15. We again discussed the procedure itself as well as risks, complications and alternatives, including but not limited to DVT, PE, infx, bleeding, failure of procedure, need for secondary procedure including manipulation, nerve injury, ongoing pain/symptoms, anesthesia risk, even stroke or death. Also discussed typical post-op protocols, activity restrictions, need for PT, flexion/extension exercises, time out of work. Discussed need for DVT ppx post-op with Xarelto then ASA per protocol. Discussed dental ppx. Also discussed limitations post-operatively such as kneeling and squatting. All questions were answered. Patient desires to proceed with surgery as scheduled. Will hold NSAIDs accordingly. Will remain NPO after MN night before surgery. Will present to [facility] for pre-op testing. Plan Xarelto 2 weeks post-op for DVT ppx then ASA. Plan pain medication, Robaxin, Colace. Plan  home with HHPT post-op with family members at home for assistance. Will follow up 10-14 days post-op for suture/staple removal and xrays.  Plan right knee removal of hardware, total knee replacement  Signed electronically by Gillermo Poch M Natalia Wittmeyer, PA-C for Dr. Beane  

## 2015-05-14 NOTE — Anesthesia Postprocedure Evaluation (Signed)
Anesthesia Post Note  Patient: Nicholas FieldWilliam L Cooke  Procedure(s) Performed: Procedure(s) (LRB): REMOVAL OF HARDWARE AND RIGHT TOTAL KNEE ARTHROPLASTY (Right)  Patient location during evaluation: PACU Anesthesia Type: General Level of consciousness: sedated Pain management: pain level controlled Vital Signs Assessment: post-procedure vital signs reviewed and stable Respiratory status: spontaneous breathing and respiratory function stable Cardiovascular status: stable Anesthetic complications: no    Last Vitals:  Filed Vitals:   05/14/15 1130 05/14/15 1144  BP: 127/79 121/67  Pulse: 88 90  Temp: 37 C 36.9 C  Resp: 13 14    Last Pain:  Filed Vitals:   05/14/15 1145  PainSc: 5                  [name] DANIEL

## 2015-05-14 NOTE — Anesthesia Procedure Notes (Signed)
Procedure Name: Intubation Date/Time: 05/14/2015 7:25 AM Performed by: Doran ClayALDAY, Kenzlee Fishburn R Pre-anesthesia Checklist: Patient identified, Timeout performed, Emergency Drugs available, Suction available and Patient being monitored Patient Re-evaluated:Patient Re-evaluated prior to inductionOxygen Delivery Method: Circle system utilized Preoxygenation: Pre-oxygenation with 100% oxygen Intubation Type: IV induction Laryngoscope Size: Mac and 4 Grade View: Grade I Tube type: Oral Tube size: 7.5 mm Number of attempts: 1 Airway Equipment and Method: Stylet Placement Confirmation: ETT inserted through vocal cords under direct vision,  breath sounds checked- equal and bilateral and positive ETCO2 Secured at: 23 cm Tube secured with: Tape Dental Injury: Teeth and Oropharynx as per pre-operative assessment

## 2015-05-14 NOTE — Interval H&P Note (Signed)
History and Physical Interval Note:  05/14/2015 7:22 AM  Nicholas Cooke  has presented today for surgery, with the diagnosis of DEGENATIVE JOINT DISEASE LOOSE HARDWARE RIGHT KNEE   The various methods of treatment have been discussed with the patient and family. After consideration of risks, benefits and other options for treatment, the patient has consented to  Procedure(s): REMOVAL OF HARDWARE AND RIGHT TOTAL KNEE ARTHROPLASTY (Right) as a surgical intervention .  The patient's history has been reviewed, patient examined, no change in status, stable for surgery.  I have reviewed the patient's chart and labs.  Questions were answered to the patient's satisfaction.     Chestine Belknap C

## 2015-05-14 NOTE — Brief Op Note (Signed)
05/14/2015  10:10 AM  PATIENT:  Nicholas FieldWilliam L Pacein-Start  64 y.o. male  PRE-OPERATIVE DIAGNOSIS:  DEGENATIVE JOINT DISEASE LOOSE HARDWARE RIGHT KNEE   POST-OPERATIVE DIAGNOSIS:  DEGENATIVE JOINT DISEASE LOOSE HARDWARE RIGHT   PROCEDURE:  Procedure(s): REMOVAL OF HARDWARE AND RIGHT TOTAL KNEE ARTHROPLASTY (Right)  SURGEON:  Surgeon(s) and Role:    * Jene EveryJeffrey Michala Deblanc, MD - Primary  PHYSICIAN ASSISTANT:   ASSISTANTS: Bissell   ANESTHESIA:   general  EBL:  Total I/O In: 2000 [I.V.:2000] Out: -   BLOOD ADMINISTERED:none  DRAINS: none   LOCAL MEDICATIONS USED:  MARCAINE     SPECIMEN:  Cyst  DISPOSITION OF SPECIMEN:  PATHOLOGY  COUNTS:  YES  TOURNIQUET:   Total Tourniquet Time Documented: area (laterality) - 115 minutes Total: area (laterality) - 115 minutes   DICTATION: .[name] Dictation  [id number]    PLAN OF CARE: Admit to inpatient   PATIENT DISPOSITION:  PACU - hemodynamically stable.   Delay start of Pharmacological VTE agent (>24hrs) due to surgical blood loss or risk of bleeding: no

## 2015-05-14 NOTE — Evaluation (Signed)
Physical Therapy Evaluation Patient Nicholas Cooke Nicholas Cooke MRN: [id number] DOB: 01-27-1951 Today's Date: 05/14/2015   History of Present Illness  Pt is a 64 year old male s/p removal of hardware right knee and right total knee replacement  Clinical Impression  Pt is s/p R TKA resulting in the deficits listed below (see PT Problem List).  Pt will benefit from skilled PT to increase their independence and safety with mobility to allow discharge to the venue listed below.  Pt assisted OOB to recliner POD #0 and plans to d/c home with spouse.     Follow Up Recommendations Home health PT    Equipment Recommendations  Rolling walker with 5" wheels    Recommendations for Other Services       Precautions / Restrictions Precautions Precautions: Fall;Knee Required Braces or Orthoses: Knee Immobilizer - Right Restrictions Weight Bearing Restrictions: No Other Position/Activity Restrictions: WBAT      Mobility  Bed Mobility Overal bed mobility: Needs Assistance Bed Mobility: Supine to Sit     Supine to sit: Min assist;HOB elevated     General bed mobility comments: verbal cues for technique, assist for R LE  Transfers Overall transfer level: Needs assistance Equipment used: Rolling walker (2 wheeled) Transfers: Sit to/from Nicholas Cooke Sit to Stand: Min guard;From elevated surface Stand pivot transfers: Min guard       General transfer comment: verbal cues for UE and LE positioning  Ambulation/Gait Ambulation/Gait assistance:  (pt declined due to pain (was in CPM on arrival))              Stairs            Wheelchair Mobility    Modified Rankin (Stroke Patients Only)       Balance                                             Pertinent Vitals/Pain Pain Assessment: 0-10 Pain Score: 6  Pain Location: R knee Pain Descriptors / Indicators: Sore;Aching Pain Intervention(s): Limited activity within  patient's tolerance;Monitored during session;Repositioned (reports pain better with repositioning)    Home Living Family/patient expects to be discharged to:: Private residence Living Arrangements: Spouse/significant other Available Help at Discharge: Family Type of Home: House Home Access: Stairs to enter Entrance Stairs-Rails: None Entrance Stairs-Number of Steps: 1 and 1 Home Layout: Able to live on main level with bedroom/bathroom;Two level   Additional Comments: spouse has walker but states it is wide    Prior Function Level of Independence: Independent               Hand Dominance        Extremity/Trunk Assessment               Lower Extremity Assessment: RLE deficits/detail RLE Deficits / Details: fair quad contraction, unable to perform SLR, maintained KI       Communication   Communication: No difficulties  Cognition Arousal/Alertness: Awake/alert Behavior During Therapy: WFL for tasks assessed/performed Overall Cognitive Status: Within Functional Limits for tasks assessed                      General Comments      Exercises        Assessment/Plan    PT Assessment Patient needs continued PT services  PT Diagnosis Difficulty walking;Acute pain   PT Problem  List Decreased strength;Decreased range of motion;Decreased mobility;Pain;Decreased knowledge of use of DME  PT Treatment Interventions Functional mobility training;Gait training;DME instruction;Stair training;Patient/family education;Therapeutic activities;Therapeutic exercise   PT Goals (Current goals can be found in the Care Plan section) Acute Rehab PT Goals PT Goal Formulation: With patient Time For Goal Achievement: 05/19/15 Potential to Achieve Goals: Good    Frequency 7X/week   Barriers to discharge        Co-evaluation               End of Session Equipment Utilized During Treatment: Gait belt;Right knee immobilizer Activity Tolerance: Patient limited by  pain Patient left: in chair;with call bell/phone within reach;with chair alarm set           Time: 1441-1500 PT Time Calculation (min) (ACUTE ONLY): 19 min   Charges:   PT Evaluation $Initial PT Evaluation Tier I: 1 Procedure     PT G Codes:        Nicholas Cooke,Nicholas Cooke 05/14/2015, 3:09 PM Nicholas Cooke, PT, DPT 05/14/2015 Pager: [phone]

## 2015-05-14 NOTE — Progress Notes (Signed)
Spoke with patient and family at bedside. This is a claim covered under workers comp. [employer] is the company in [place], Claims person is Brandon Melnickndrea Farrinelli [phone] or Erskine SquibbJane [phone]. I spoke with Sue Lushndrea who will make the arrangements [facility]. She was agreeable to have [facility] provide DME, [facility] for [facility]. Emailed HH orders to her at [email or web address] per her request. Will [facility].

## 2015-05-14 NOTE — Transfer of Care (Signed)
Immediate Anesthesia Transfer of Care Note  Patient: Nicholas FieldWilliam L Pacein-Cocuzza  Procedure(s) Performed: Procedure(s): REMOVAL OF HARDWARE AND RIGHT TOTAL KNEE ARTHROPLASTY (Right)  Patient Location: PACU  Anesthesia Type:General  Level of Consciousness: sedated  Airway & Oxygen Therapy: Patient Spontanous Breathing and Patient connected to face mask oxygen  Post-op Assessment: Report given to RN and Post -op Vital signs reviewed and stable  Post vital signs: Reviewed and stable  Last Vitals:  Filed Vitals:   05/14/15 0533  BP: 136/83  Pulse: 88  Temp: 36.4 C  Resp: 18    Complications: No apparent anesthesia complications

## 2015-05-15 LAB — BASIC METABOLIC PANEL
Anion gap: 8 (ref 5–15)
BUN: 14 mg/dL (ref 6–20)
CHLORIDE: 104 mmol/L (ref 101–111)
CO2: 24 mmol/L (ref 22–32)
CREATININE: 1.44 mg/dL — AB (ref 0.61–1.24)
Calcium: 8.3 mg/dL — ABNORMAL LOW (ref 8.9–10.3)
GFR calc Af Amer: 59 mL/min — ABNORMAL LOW (ref >60)
GFR calc non Af Amer: 51 mL/min — ABNORMAL LOW (ref >60)
GLUCOSE: 126 mg/dL — AB (ref 65–99)
POTASSIUM: 4.3 mmol/L (ref 3.5–5.1)
Sodium: 136 mmol/L (ref 135–145)

## 2015-05-15 LAB — CBC
HCT: 38.4 % — ABNORMAL LOW (ref 39.0–52.0)
HEMOGLOBIN: 12.7 g/dL — AB (ref 13.0–17.0)
MCH: 28.7 pg (ref 26.0–34.0)
MCHC: 33.1 g/dL (ref 30.0–36.0)
MCV: 86.9 fL (ref 78.0–100.0)
Platelets: 240 10*3/uL (ref 150–400)
RBC: 4.42 MIL/uL (ref 4.22–5.81)
RDW: 13.8 % (ref 11.5–15.5)
WBC: 14.2 10*3/uL — ABNORMAL HIGH (ref 4.0–10.5)

## 2015-05-15 NOTE — Progress Notes (Signed)
Utilization review completed.  

## 2015-05-15 NOTE — Discharge Instructions (Signed)
Elevate leg above heart 6x a day for 20minutes each °Use knee immobilizer while walking until can SLR x 10 °Use knee immobilizer in bed to keep knee in extension °Aquacel dressing may remain in place until follow up. May shower with aquacel dressing in place. If the dressing becomes saturated or peels off, you may remove aquacel dressing. Do not remove steri-strips if they are present. Place new dressing with gauze and tape or ACE bandage which should be kept clean and dry and changed daily. ° °INSTRUCTIONS AFTER JOINT REPLACEMENT  ° °o Remove items at home which could result in a fall. This includes throw rugs or furniture in walking pathways °o ICE to the affected joint every three hours while awake for 30 minutes at a time, for at least the first 3-5 days, and then as needed for pain and swelling.  Continue to use ice for pain and swelling. You may notice swelling that will progress down to the foot and ankle.  This is normal after surgery.  Elevate your leg when you are not up walking on it.   °o Continue to use the breathing machine you got in the hospital (incentive spirometer) which will help keep your temperature down.  It is common for your temperature to cycle up and down following surgery, especially at night when you are not up moving around and exerting yourself.  The breathing machine keeps your lungs expanded and your temperature down. ° ° °DIET:  As you were doing prior to hospitalization, we recommend a well-balanced diet. ° °DRESSING / WOUND CARE / SHOWERING ° °Keep the surgical dressing until follow up.  The dressing is water proof, so you can shower without any extra covering.  IF THE DRESSING FALLS OFF or the wound gets wet inside, change the dressing with sterile gauze.  Please use good hand washing techniques before changing the dressing.  Do not use any lotions or creams on the incision until instructed by your surgeon.   ° °ACTIVITY ° °o Increase activity slowly as tolerated, but follow the  weight bearing instructions below.   °o No driving for 6 weeks or until further direction given by your physician.  You cannot drive while taking narcotics.  °o No lifting or carrying greater than 10 lbs. until further directed by your surgeon. °o Avoid periods of inactivity such as sitting longer than an hour when not asleep. This helps prevent blood clots.  °o You may return to work once you are authorized by your doctor.  ° ° ° °WEIGHT BEARING  ° °Weight bearing as tolerated with assist device (walker, cane, etc) as directed, use it as long as suggested by your surgeon or therapist, typically at least 4-6 weeks. ° ° °EXERCISES ° °Results after joint replacement surgery are often greatly improved when you follow the exercise, range of motion and muscle strengthening exercises prescribed by your doctor. Safety measures are also important to protect the joint from further injury. Any time any of these exercises cause you to have increased pain or swelling, decrease what you are doing until you are comfortable again and then slowly increase them. If you have problems or questions, call your caregiver or physical therapist for advice.  ° °Rehabilitation is important following a joint replacement. After just a few days of immobilization, the muscles of the leg can become weakened and shrink (atrophy).  These exercises are designed to build up the tone and strength of the thigh and leg muscles and to improve motion. Often   times heat used for twenty to thirty minutes before working out will loosen up your tissues and help with improving the range of motion but do not use heat for the first two weeks following surgery (sometimes heat can increase post-operative swelling).  ° °These exercises can be done on a training (exercise) mat, on the floor, on a table or on a bed. Use whatever works the best and is most comfortable for you.    Use music or television while you are exercising so that the exercises are a pleasant  break in your day. This will make your life better with the exercises acting as a break in your routine that you can look forward to.   Perform all exercises about fifteen times, three times per day or as directed.  You should exercise both the operative leg and the other leg as well. ° °Exercises include: °  °• Quad Sets - Tighten up the muscle on the front of the thigh (Quad) and hold for 5-10 seconds.   °• Straight Leg Raises - With your knee straight (if you were given a brace, keep it on), lift the leg to 60 degrees, hold for 3 seconds, and slowly lower the leg.  Perform this exercise against resistance later as your leg gets stronger.  °• Leg Slides: Lying on your back, slowly slide your foot toward your buttocks, bending your knee up off the floor (only go as far as is comfortable). Then slowly slide your foot back down until your leg is flat on the floor again.  °• Angel Wings: Lying on your back spread your legs to the side as far apart as you can without causing discomfort.  °• Hamstring Strength:  Lying on your back, push your heel against the floor with your leg straight by tightening up the muscles of your buttocks.  Repeat, but this time bend your knee to a comfortable angle, and push your heel against the floor.  You may put a pillow under the heel to make it more comfortable if necessary.  ° °A rehabilitation program following joint replacement surgery can speed recovery and prevent re-injury in the future due to weakened muscles. Contact your doctor or a physical therapist for more information on knee rehabilitation.  ° ° °CONSTIPATION ° °Constipation is defined medically as fewer than three stools per week and severe constipation as less than one stool per week.  Even if you have a regular bowel pattern at home, your normal regimen is likely to be disrupted due to multiple reasons following surgery.  Combination of anesthesia, postoperative narcotics, change in appetite and fluid intake all can  affect your bowels.  ° °YOU MUST use at least one of the following options; they are listed in order of increasing strength to get the job done.  They are all available over the counter, and you may need to use some, POSSIBLY even all of these options:   ° °Drink plenty of fluids (prune juice may be helpful) and high fiber foods °Colace 100 mg by mouth twice a day  °Senokot for constipation as directed and as needed Dulcolax (bisacodyl), take with full glass of water  °Miralax (polyethylene glycol) once or twice a day as needed. ° °If you have tried all these things and are unable to have a bowel movement in the first 3-4 days after surgery call either your surgeon or your primary doctor.   ° °If you experience loose stools or diarrhea, hold the medications until you stool   forms back up.  If your symptoms do not get better within 1 week or if they get worse, check with your doctor.  If you experience "the worst abdominal pain ever" or develop nausea or vomiting, please contact the office immediately for further recommendations for treatment. ° ° °ITCHING:  If you experience itching with your medications, try taking only a single pain pill, or even half a pain pill at a time.  You can also use Benadryl over the counter for itching or also to help with sleep.  ° °TED HOSE STOCKINGS:  Use stockings on both legs until for at least 2 weeks or as directed by physician office. They may be removed at night for sleeping. ° °MEDICATIONS:  See your medication summary on the “After Visit Summary” that nursing will review with you.  You may have some home medications which will be placed on hold until you complete the course of blood thinner medication.  It is important for you to complete the blood thinner medication as prescribed. ° °PRECAUTIONS:  If you experience chest pain or shortness of breath - call 911 immediately for transfer to the hospital emergency department.  ° °If you develop a fever greater that 101 F, purulent  drainage from wound, increased redness or drainage from wound, foul odor from the wound/dressing, or calf pain - CONTACT YOUR SURGEON.   °                                                °FOLLOW-UP APPOINTMENTS:  If you do not already have a post-op appointment, please call the office for an appointment to be seen by your surgeon.  Guidelines for how soon to be seen are listed in your “After Visit Summary”, but are typically between 1-4 weeks after surgery. ° °OTHER INSTRUCTIONS:  ° °Knee Replacement:  Do not place pillow under knee, focus on keeping the knee straight while resting. CPM instructions: 0-90 degrees, 2 hours in the morning, 2 hours in the afternoon, and 2 hours in the evening. Place foam block, curve side up under heel at all times except when in CPM or when walking.  DO NOT modify, tear, cut, or change the foam block in any way. ° °MAKE SURE YOU:  °• Understand these instructions.  °• Get help right away if you are not doing well or get worse.  ° ° °Thank you for letting us be a part of your medical care team.  It is a privilege we respect greatly.  We hope these instructions will help you stay on track for a fast and full recovery!  ° °Information on my medicine - XARELTO® (Rivaroxaban) ° ° °Why was Xarelto® prescribed for you? °Xarelto® was prescribed for you to reduce the risk of blood clots forming after orthopedic surgery. The medical term for these abnormal blood clots is venous thromboembolism (VTE). ° °What do you need to know about xarelto® ? °Take your Xarelto® ONCE DAILY at the same time every day. °You may take it either with or without food. ° °If you have difficulty swallowing the tablet whole, you may crush it and mix in applesauce just prior to taking your dose. ° °Take Xarelto® exactly as prescribed by your doctor and DO NOT stop taking Xarelto® without talking to the doctor who prescribed the medication.  Stopping without other VTE prevention medication to   take the place of Xarelto®  may increase your risk of developing a clot. ° °After discharge, you should have regular check-up appointments with your healthcare provider that is prescribing your Xarelto®.   ° °What do you do if you miss a dose? °If you miss a dose, take it as soon as you remember on the same day then continue your regularly scheduled once daily regimen the next day. Do not take two doses of Xarelto® on the same day.  ° °Important Safety Information °A possible side effect of Xarelto® is bleeding. You should call your healthcare provider right away if you experience any of the following: °? Bleeding from an injury or your nose that does not stop. °? Unusual colored urine (red or dark brown) or unusual colored stools (red or black). °? Unusual bruising for unknown reasons. °? A serious fall or if you hit your head (even if there is no bleeding). ° °Some medicines may interact with Xarelto® and might increase your risk of bleeding while on Xarelto®. To help avoid this, consult your healthcare provider or pharmacist prior to using any new prescription or non-prescription medications, including herbals, vitamins, non-steroidal anti-inflammatory drugs (NSAIDs) and supplements. ° °This website has more information on Xarelto®: www.xarelto.com. ° ° °

## 2015-05-15 NOTE — Evaluation (Signed)
Occupational Therapy Evaluation Patient Details Name: DAO MEMMOTT MRN: [id number] DOB: 04/14/1951 Today's Date: 05/15/2015    History of Present Illness Pt is a 64 year old male s/p removal of hardware right knee and right total knee replacement   Clinical Impression   Pt was admitted for the above surgery.  All education was completed.  No further OT is needed at this time.    Follow Up Recommendations  No OT follow up;Supervision/Assistance - 24 hour    Equipment Recommendations  None by OT   Recommendations for Other Services       Precautions / Restrictions Precautions Precautions: Fall;Knee Required Braces or Orthoses: Knee Immobilizer - Right Restrictions Weight Bearing Restrictions: No Other Position/Activity Restrictions: WBAT      Mobility Bed Mobility         Supine to sit: Min assist;HOB elevated     General bed mobility comments: assist for RLE  Transfers   Equipment used: Rolling walker (2 wheeled) Transfers: Sit to/from Stand Sit to Stand: Min guard;From elevated surface         General transfer comment: for safety; cues for UE/LE placement    Balance                                            ADL Overall ADL's : Needs assistance/impaired     Grooming: Wash/dry Diplomatic Services operational officer: Min guard;Ambulation;BSC             General ADL Comments: Wife will assist pt with ADLs at home.  He has a tub and plans to sponge bathe initially.     Vision     Perception     Praxis      Pertinent Vitals/Pain Pain Score: 8  Pain Location: R knee Pain Descriptors / Indicators: Sore;Aching Pain Intervention(s): Limited activity within patient's tolerance;Monitored during session;Premedicated before session;Repositioned;Ice applied     Hand Dominance     Extremity/Trunk Assessment Upper Extremity Assessment Upper Extremity Assessment: Overall WFL for  tasks assessed           Communication Communication Communication: No difficulties   Cognition Arousal/Alertness: Awake/alert Behavior During Therapy: WFL for tasks assessed/performed Overall Cognitive Status: Within Functional Limits for tasks assessed                     General Comments       Exercises       Shoulder Instructions      Home Living Family/patient expects to be discharged to:: Private residence Living Arrangements: Spouse/significant other Available Help at Discharge: Family               Bathroom Shower/Tub: Chief Strategy Officer: Standard; has 3:1 from wife's surgery                Prior Functioning/Environment Level of Independence: Independent             OT Diagnosis: Generalized weakness   OT Problem List:     OT Treatment/Interventions:      OT Goals(Current goals can be found in the care plan section) Acute Rehab OT Goals Patient Stated Goal: decreased pain OT Goal Formulation: All assessment and education complete, DC therapy  OT Frequency:     Barriers  to D/C:            Co-evaluation              End of Session CPM Right Knee CPM Right Knee: Off  Activity Tolerance: Patient tolerated treatment well Patient left: in bed;with call bell/phone within reach   Time: [id number] OT Time Calculation (min): 20 min Charges:  OT General Charges $OT Visit: 1 Procedure OT Evaluation $Initial OT Evaluation Tier I: 1 Procedure G-Codes:    Bettina Warn 05/15/2015, 10:47 AM   Marica OtterMaryellen Eugina Row, OTR/L [phone] 05/15/2015

## 2015-05-15 NOTE — Progress Notes (Signed)
Physical Therapy Treatment Patient Details Name: Nori RiisWilliam L Pacein-Bayle MRN: [id number] DOB: 11-Oct-1950 Today's Date: 05/15/2015    History of Present Illness Pt is a 64 year old male s/p removal of hardware right knee and right total knee replacement    PT Comments    Pt reports 7-8/10 knee pain with mobility however able to ambulate in hallway this morning then assisted to recliner.  Follow Up Recommendations  Home health PT     Equipment Recommendations  Rolling walker with 5" wheels    Recommendations for Other Services       Precautions / Restrictions Precautions Precautions: Fall;Knee Required Braces or Orthoses: Knee Immobilizer - Right Restrictions Other Position/Activity Restrictions: WBAT    Mobility  Bed Mobility Overal bed mobility: Needs Assistance Bed Mobility: Supine to Sit     Supine to sit: Min assist;HOB elevated     General bed mobility comments: verbal cues for technique, assist for R LE  Transfers Overall transfer level: Needs assistance Equipment used: Rolling walker (2 wheeled) Transfers: Sit to/from Stand Sit to Stand: Min guard;From elevated surface         General transfer comment: verbal cues for UE and LE positioning  Ambulation/Gait Ambulation/Gait assistance: Min guard Ambulation Distance (Feet): 80 Feet Assistive device: Rolling walker (2 wheeled) Gait Pattern/deviations: Step-to pattern;Decreased stance time - right;Antalgic     General Gait Details: verbal cues for sequence, RW positioning, step length   Stairs            Wheelchair Mobility    Modified Rankin (Stroke Patients Only)       Balance                                    Cognition Arousal/Alertness: Awake/alert Behavior During Therapy: WFL for tasks assessed/performed Overall Cognitive Status: Within Functional Limits for tasks assessed                      Exercises      General Comments        Pertinent  Vitals/Pain Pain Assessment: 0-10 Pain Score: 8  Pain Location: R knee Pain Descriptors / Indicators: Sore;Burning;Aching Pain Intervention(s): Limited activity within patient's tolerance;Monitored during session;Premedicated before session;Repositioned;Ice applied    Home Living Family/patient expects to be discharged to:: Private residence Living Arrangements: Spouse/significant other Available Help at Discharge: Family                Prior Function Level of Independence: Independent          PT Goals (current goals can now be found in the care plan section) Acute Rehab PT Goals Patient Stated Goal: decreased pain Progress towards PT goals: Progressing toward goals    Frequency  7X/week    PT Plan Current plan remains appropriate    Co-evaluation             End of Session Equipment Utilized During Treatment: Gait belt;Right knee immobilizer Activity Tolerance: Patient limited by pain Patient left: in chair;with call bell/phone within reach     Time: 1041-1052 PT Time Calculation (min) (ACUTE ONLY): 11 min  Charges:  $Gait Training: 8-22 mins                    G Codes:      Maryanne Huneycutt,KATHrine E 05/15/2015, 1:44 PM Zenovia JarredKati Ronda Rajkumar, PT, DPT 05/15/2015 Pager: [phone]

## 2015-05-15 NOTE — Progress Notes (Signed)
Subjective: 1 Day Post-Op Procedure(s) (LRB): REMOVAL OF HARDWARE AND RIGHT TOTAL KNEE ARTHROPLASTY (Right) Patient reports pain as 3 on 0-10 scale.    Objective: Vital signs in last 24 hours: Temp:  [97.9 F (36.6 C)-98.4 F (36.9 C)] 97.9 F (36.6 C) (12/30 0622) Pulse Rate:  [77-90] 77 (12/30 0622) Resp:  [14-20] 17 (12/30 0622) BP: (109-128)/(66-77) 109/66 mmHg (12/30 0622) SpO2:  [95 %-100 %] 100 % (12/30 0622)  Intake/Output from previous day: 12/29 0701 - 12/30 0700 In: 4627.5 [P.O.:1200; I.V.:3377.5; IV Piggyback:50] Out: 1750 [Urine:1750] Intake/Output this shift: Total I/O In: 122 [I.V.:122] Out: 150 [Urine:150]   Recent Labs  05/15/15 0440  HGB 12.7*    Recent Labs  05/15/15 0440  WBC 14.2*  RBC 4.42  HCT 38.4*  PLT 240    Recent Labs  05/15/15 0440  NA 136  K 4.3  CL 104  CO2 24  BUN 14  CREATININE 1.44*  GLUCOSE 126*  CALCIUM 8.3*   No results for input(s): LABPT, INR in the last 72 hours.  Neurologically intact ABD soft Intact pulses distally Incision: dressing C/D/I No cellulitis present Compartment soft  Assessment/Plan: 1 Day Post-Op Procedure(s) (LRB): REMOVAL OF HARDWARE AND RIGHT TOTAL KNEE ARTHROPLASTY (Right) Advance diet Up with therapy D/C IV fluids  Larkyn Greenberger C 05/15/2015, 11:35 AM

## 2015-05-15 NOTE — Progress Notes (Signed)
Physical Therapy Treatment Note    05/15/15 1600  PT Visit Information  Last PT Received On 05/15/15  Assistance Needed +1  History of Present Illness Pt is a 64 year old male s/p removal of hardware right knee and right total knee replacement  PT Time Calculation  PT Start Time (ACUTE ONLY) 1405  PT Stop Time (ACUTE ONLY) 1428  PT Time Calculation (min) (ACUTE ONLY) 23 min  Subjective Data  Subjective Pt ambulated to bathroom and then into hallway.  Pt continues to reports pain 7/10 with mobility.  Pt performed LE exercises once returned to supine.  Precautions  Precautions Fall;Knee  Required Braces or Orthoses Knee Immobilizer - Right  Restrictions  Other Position/Activity Restrictions WBAT  Pain Assessment  Pain Assessment 0-10  Pain Score 7  Pain Location R knee  Pain Descriptors / Indicators Aching;Sore;Burning  Pain Intervention(s) Limited activity within patient's tolerance;Monitored during session;Repositioned  Cognition  Arousal/Alertness Awake/alert  Behavior During Therapy WFL for tasks assessed/performed  Overall Cognitive Status Within Functional Limits for tasks assessed  Bed Mobility  Overal bed mobility Needs Assistance  Bed Mobility Supine to Sit;Sit to Supine  Supine to sit Min assist  Sit to supine Min assist  General bed mobility comments verbal cues for technique, assist for R LE  Transfers  Overall transfer level Needs assistance  Equipment used Rolling walker (2 wheeled)  Transfers Sit to/from Stand  Sit to Stand Min guard  General transfer comment verbal cues for UE and LE positioning  Ambulation/Gait  Ambulation/Gait assistance Min guard  Ambulation Distance (Feet) 40 Feet  Assistive device Rolling walker (2 wheeled)  Gait Pattern/deviations Step-to pattern;Antalgic;Decreased stance time - right  General Gait Details verbal cues for sequence, RW positioning, step length  Gait velocity decreased  Exercises  Exercises Total Joint  Total Joint  Exercises  Ankle Circles/Pumps AROM;10 reps;Both  Quad Sets AROM;Both;10 reps  Short [employer] AAROM;Right;10 reps  Heel Slides AAROM;Right;10 reps  Straight Leg Raises AAROM;Right;10 reps  Goniometric ROM knee flexion AAROM 25* limited by pain  PT - End of Session  Equipment Utilized During Treatment Right knee immobilizer  Activity Tolerance Patient limited by pain  Patient left in bed;with call bell/phone within reach;with family/visitor present  PT - Assessment/Plan  PT Plan Current plan remains appropriate  PT Frequency (ACUTE ONLY) 7X/week  Follow Up Recommendations Home health PT  PT equipment Rolling walker with 5" wheels  PT Goal Progression  Progress towards PT goals Progressing toward goals  PT General Charges  $$ ACUTE PT VISIT 1 Procedure  PT Treatments  $Gait Training 8-22 mins  $Therapeutic Exercise 8-22 mins   Zenovia JarredKati Caliana Spires, PT, DPT 05/15/2015 Pager: [phone]

## 2015-05-15 NOTE — Care Management Note (Signed)
Case Management Note  Patient Details  Name: Nori RiisWilliam L Pacein-Calia MRN: [id number] Date of Birth: January 18, 1951  Subjective/Objective:    S/p Right total knee replacement                Action/Plan: Discharge planning, contacted [facility] again today. HH has been arranged with [facility] [phone]. Contacted them to confirm services, provided contact information for the patient. They will contact the patient to set up initial visit. Informed them of d/c today or tomorrow, they will put a "rush" on the referral. Provided with a reference # of [id number] for patient should he have questions.   Expected Discharge Date:  05/16/15               Expected Discharge Plan:  Home w Home Health Services  In-House Referral:  NA  Discharge planning Services  CM Consult  Post Acute Care Choice:  Home Health Choice offered to:  Patient  DME Arranged:  N/A DME Agency:  NA  HH Arranged:  NA HH Agency:  NA  Status of Service:  Completed, signed off  Medicare Important Message Given:    Date Medicare IM Given:    Medicare IM give by:    Date Additional Medicare IM Given:    Additional Medicare Important Message give by:     If discussed at Long Length of Stay Meetings, dates discussed:    Additional Comments:  Alexis Goodelleele, Artesha Wemhoff K, RN 05/15/2015, 11:03 AM

## 2015-05-16 LAB — CBC
HCT: 40.6 % (ref 39.0–52.0)
HEMOGLOBIN: 13.7 g/dL (ref 13.0–17.0)
MCH: 29 pg (ref 26.0–34.0)
MCHC: 33.7 g/dL (ref 30.0–36.0)
MCV: 86 fL (ref 78.0–100.0)
Platelets: 239 10*3/uL (ref 150–400)
RBC: 4.72 MIL/uL (ref 4.22–5.81)
RDW: 13.6 % (ref 11.5–15.5)
WBC: 15.4 10*3/uL — ABNORMAL HIGH (ref 4.0–10.5)

## 2015-05-16 NOTE — Progress Notes (Signed)
Subjective: 2 Days Post-Op Procedure(s) (LRB): REMOVAL OF HARDWARE AND RIGHT TOTAL KNEE ARTHROPLASTY (Right) Patient reports pain as 3 on 0-10 scale.    Objective: Vital signs in last 24 hours: Temp:  [98.7 F (37.1 C)-100.7 F (38.2 C)] 98.7 F (37.1 C) (12/31 0515) Pulse Rate:  [103-109] 103 (12/31 0515) Resp:  [16-20] 20 (12/31 0515) BP: (130-146)/(72-83) 134/72 mmHg (12/31 0515) SpO2:  [94 %-98 %] 95 % (12/31 0515)  Intake/Output from previous day: 12/30 0701 - 12/31 0700 In: 1082 [P.O.:960; I.V.:122] Out: 1725 [Urine:1725] Intake/Output this shift:     Recent Labs  05/15/15 0440 05/16/15 0500  HGB 12.7* 13.7    Recent Labs  05/15/15 0440 05/16/15 0500  WBC 14.2* 15.4*  RBC 4.42 4.72  HCT 38.4* 40.6  PLT 240 239    Recent Labs  05/15/15 0440  NA 136  K 4.3  CL 104  CO2 24  BUN 14  CREATININE 1.44*  GLUCOSE 126*  CALCIUM 8.3*   No results for input(s): LABPT, INR in the last 72 hours.  Neurologically intact Intact pulses distally Incision: dressing C/D/I No cellulitis present Compartment soft  Assessment/Plan: 2 Days Post-Op Procedure(s) (LRB): REMOVAL OF HARDWARE AND RIGHT TOTAL KNEE ARTHROPLASTY (Right) Advance diet Up with therapy D/C IV fluids Discharge home with home health  D/C instr given.  Analiza Cowger C 05/16/2015, 8:28 AM

## 2015-05-16 NOTE — Progress Notes (Signed)
Physical Therapy Treatment Patient Details Name: Nicholas Cooke MRN: [id number] DOB: Nov 19, 1950 Today's Date: 05/16/2015    History of Present Illness Pt is a 64 year old male s/p removal of hardware right knee and right total knee replacement    PT Comments    Progressing well; wife present end of session, all concerns addressed  Follow Up Recommendations  Home health PT     Equipment Recommendations  Rolling walker with 5" wheels    Recommendations for Other Services       Precautions / Restrictions Precautions Precautions: Fall;Knee Required Braces or Orthoses: Knee Immobilizer - Right Knee Immobilizer - Right: Discontinue once straight leg raise with < 10 degree lag Restrictions Weight Bearing Restrictions: No Other Position/Activity Restrictions: WBAT    Mobility  Bed Mobility   Bed Mobility: Supine to Sit     Supine to sit: Min assist     General bed mobility comments: verbal cues for technique, assist for R LE  Transfers Overall transfer level: Needs assistance Equipment used: Rolling walker (2 wheeled) Transfers: Sit to/from Stand Sit to Stand: Supervision;Modified independent (Device/Increase time) Stand pivot transfers: Modified independent (Device/Increase time);Supervision       General transfer comment: verbal cues for UE and LE positioning, pt self corrects end of session  Ambulation/Gait Ambulation/Gait assistance: Supervision Ambulation Distance (Feet): 90 Feet Assistive device: Rolling walker (2 wheeled) Gait Pattern/deviations: Step-to pattern;Antalgic Gait velocity: decreased   General Gait Details: verbal cues for sequence, RW positioning, step length   Stairs Stairs: Yes Stairs assistance: Min assist Stair Management: No rails;Step to pattern;Backwards;With walker Number of Stairs: 2 General stair comments: cues for sequence and technique; pt wife familiar with technique from her surgeries and feels comfortable wtih  assisting pt  Wheelchair Mobility    Modified Rankin (Stroke Patients Only)       Balance                                    Cognition Arousal/Alertness: Awake/alert Behavior During Therapy: WFL for tasks assessed/performed Overall Cognitive Status: Within Functional Limits for tasks assessed                      Exercises Total Joint Exercises Ankle Circles/Pumps: AROM;10 reps;Both Quad Sets: AROM;Both;10 reps Heel Slides: AAROM;Right;10 reps Straight Leg Raises: AAROM;Right;10 reps Goniometric ROM: ~10-55* AAROM knee flexion    General Comments        Pertinent Vitals/Pain Pain Assessment: 0-10 Pain Score: 5  Pain Location: R  knee Pain Descriptors / Indicators: Aching Pain Intervention(s): Limited activity within patient's tolerance;Monitored during session;Premedicated before session;Repositioned;Ice applied    Home Living                      Prior Function            PT Goals (current goals can now be found in the care plan section) Acute Rehab PT Goals Patient Stated Goal: decreased pain PT Goal Formulation: With patient Time For Goal Achievement: 05/19/15 Potential to Achieve Goals: Good Progress towards PT goals: Progressing toward goals    Frequency  7X/week    PT Plan Current plan remains appropriate    Co-evaluation             End of Session Equipment Utilized During Treatment: Right knee immobilizer;Gait belt Activity Tolerance: Patient tolerated treatment well Patient left: in chair;with call bell/phone  within reach;with chair alarm set;with family/visitor present     Time: 1610-9604 PT Time Calculation (min) (ACUTE ONLY): 27 min  Charges:  $Gait Training: 8-22 mins $Therapeutic Exercise: 8-22 mins                    G Codes:      Prashant Glosser 2015/06/07, 10:10 AM

## 2015-05-16 NOTE — Progress Notes (Signed)
Pt dc to home; escorted to lobby via wheelchair by nursing tech. Spouse to drive pt home. Pt given prescriptions and dc paper work prior to discharging

## 2015-05-16 NOTE — Discharge Summary (Signed)
Physician Discharge Summary   Patient ID: Nicholas Cooke MRN: [id number] DOB/AGE: [birth year] 65 y.o.  Admit date: 05/14/2015 Discharge date:   Primary Diagnosis: retained hardware right knee, primary osteoarthritis right knee  Admission Diagnoses:  Past Medical History  Diagnosis Date  . Diverticulosis   . Hypertension   . Arthritis   . Enlarged prostate    Discharge Diagnoses:   Principal Problem:   Osteoarthritis of right knee Active Problems:   Right knee DJD  Estimated body mass index is 28.34 kg/(m^2) as calculated from the following:   Height as of this encounter: _0  (1.753 m).   Weight as of this encounter: 87.091 kg (192 lb).  Procedure:  Procedure(s) (LRB): REMOVAL OF HARDWARE AND RIGHT TOTAL KNEE ARTHROPLASTY (Right)   Consults: None  HPI: see H&P Laboratory Data: Admission on 05/14/2015  Component Date Value Ref Range Status  . WBC 05/15/2015 14.2* 4.0 - 10.5 K/uL Final  . RBC 05/15/2015 4.42  4.22 - 5.81 MIL/uL Final  . Hemoglobin 05/15/2015 12.7* 13.0 - 17.0 g/dL Final  . HCT 05/15/2015 38.4* 39.0 - 52.0 % Final  . MCV 05/15/2015 86.9  78.0 - 100.0 fL Final  . MCH 05/15/2015 28.7  26.0 - 34.0 pg Final  . MCHC 05/15/2015 33.1  30.0 - 36.0 g/dL Final  . RDW 05/15/2015 13.8  11.5 - 15.5 % Final  . Platelets 05/15/2015 240  150 - 400 K/uL Final  . Sodium 05/15/2015 136  135 - 145 mmol/L Final  . Potassium 05/15/2015 4.3  3.5 - 5.1 mmol/L Final  . Chloride 05/15/2015 104  101 - 111 mmol/L Final  . CO2 05/15/2015 24  22 - 32 mmol/L Final  . Glucose, Bld 05/15/2015 126* 65 - 99 mg/dL Final  . BUN 05/15/2015 14  6 - 20 mg/dL Final  . Creatinine, Ser 05/15/2015 1.44* 0.61 - 1.24 mg/dL Final  . Calcium 05/15/2015 8.3* 8.9 - 10.3 mg/dL Final  . GFR calc non Af Amer 05/15/2015 51* >60 mL/min Final  . GFR calc Af Amer 05/15/2015 59* >60 mL/min Final   Comment: (NOTE) The eGFR has been calculated using the CKD EPI equation. This calculation has not  been validated in all clinical situations. eGFR's persistently <60 mL/min signify possible Chronic Kidney Disease.   . Anion gap 05/15/2015 8  5 - 15 Final  . WBC 05/16/2015 15.4* 4.0 - 10.5 K/uL Final  . RBC 05/16/2015 4.72  4.22 - 5.81 MIL/uL Final  . Hemoglobin 05/16/2015 13.7  13.0 - 17.0 g/dL Final  . HCT 05/16/2015 40.6  39.0 - 52.0 % Final  . MCV 05/16/2015 86.0  78.0 - 100.0 fL Final  . MCH 05/16/2015 29.0  26.0 - 34.0 pg Final  . MCHC 05/16/2015 33.7  30.0 - 36.0 g/dL Final  . RDW 05/16/2015 13.6  11.5 - 15.5 % Final  . Platelets 05/16/2015 239  150 - 400 K/uL Final  Hospital Outpatient Visit on 05/06/2015  Component Date Value Ref Range Status  . Sodium 05/06/2015 138  135 - 145 mmol/L Final  . Potassium 05/06/2015 4.1  3.5 - 5.1 mmol/L Final  . Chloride 05/06/2015 106  101 - 111 mmol/L Final  . CO2 05/06/2015 24  22 - 32 mmol/L Final  . Glucose, Bld 05/06/2015 86  65 - 99 mg/dL Final  . BUN 05/06/2015 17  6 - 20 mg/dL Final  . Creatinine, Ser 05/06/2015 1.58* 0.61 - 1.24 mg/dL Final  . Calcium 05/06/2015 9.3  8.9 -  10.3 mg/dL Final  . GFR calc non Af Amer 05/06/2015 45* >60 mL/min Final  . GFR calc Af Amer 05/06/2015 52* >60 mL/min Final   Comment: (NOTE) The eGFR has been calculated using the CKD EPI equation. This calculation has not been validated in all clinical situations. eGFR's persistently <60 mL/min signify possible Chronic Kidney Disease.   . Anion gap 05/06/2015 8  5 - 15 Final  . WBC 05/06/2015 9.0  4.0 - 10.5 K/uL Final  . RBC 05/06/2015 5.39  4.22 - 5.81 MIL/uL Final  . Hemoglobin 05/06/2015 15.3  13.0 - 17.0 g/dL Final  . HCT 05/06/2015 45.7  39.0 - 52.0 % Final  . MCV 05/06/2015 84.8  78.0 - 100.0 fL Final  . MCH 05/06/2015 28.4  26.0 - 34.0 pg Final  . MCHC 05/06/2015 33.5  30.0 - 36.0 g/dL Final  . RDW 05/06/2015 13.9  11.5 - 15.5 % Final  . Platelets 05/06/2015 261  150 - 400 K/uL Final  . Prothrombin Time 05/06/2015 15.5* 11.6 - 15.2 seconds  Final  . INR 05/06/2015 1.21  0.00 - 1.49 Final  . ABO/RH(D) 05/06/2015 A POS   Final  . Antibody Screen 05/06/2015 NEG   Final  . Sample Expiration 05/06/2015 05/17/2015   Final  . Extend sample reason 05/06/2015 NO TRANSFUSIONS OR PREGNANCY IN THE PAST 3 MONTHS   Final  . Color, Urine 05/06/2015 YELLOW  YELLOW Final  . APPearance 05/06/2015 CLEAR  CLEAR Final  . Specific Gravity, Urine 05/06/2015 1.023  1.005 - 1.030 Final  . pH 05/06/2015 5.0  5.0 - 8.0 Final  . Glucose, UA 05/06/2015 NEGATIVE  NEGATIVE mg/dL Final  . Hgb urine dipstick 05/06/2015 NEGATIVE  NEGATIVE Final  . Bilirubin Urine 05/06/2015 NEGATIVE  NEGATIVE Final  . Ketones, ur 05/06/2015 NEGATIVE  NEGATIVE mg/dL Final  . Protein, ur 05/06/2015 NEGATIVE  NEGATIVE mg/dL Final  . Nitrite 05/06/2015 NEGATIVE  NEGATIVE Final  . Leukocytes, UA 05/06/2015 NEGATIVE  NEGATIVE Final   MICROSCOPIC NOT DONE ON URINES WITH NEGATIVE PROTEIN, BLOOD, LEUKOCYTES, NITRITE, OR GLUCOSE <1000 mg/dL.  Marland Kitchen MRSA, PCR 05/06/2015 NEGATIVE  NEGATIVE Final  . Staphylococcus aureus 05/06/2015 NEGATIVE  NEGATIVE Final   Comment:        The Xpert SA Assay (FDA approved for NASAL specimens in patients over 60 years of age), is one component of a comprehensive surveillance program.  Test performance has been validated by [facility] for patients greater than or equal to 41 year old. It is not intended to diagnose infection nor to guide or monitor treatment.   . ABO/RH(D) 05/06/2015 A POS   Final     X-Rays:Dg Knee 1-2 Views Right  05/06/2015  CLINICAL DATA:  Right knee surgery EXAM: RIGHT KNEE - 1-2 VIEW COMPARISON:  No recent prior. FINDINGS: All patellofemoral replacement. Hardware intact. No acute bony abnormality identified. Small knee joint effusion cannot be excluded IMPRESSION: 1. Postsurgical changes right knee with patellofemoral replacement. Hardware intact. Small knee joint effusion cannot be excluded. 2. No acute bony abnormality.  Electronically Signed   By: Marcello Moores  Register   On: 05/06/2015 10:38   Dg Knee Right Port  05/14/2015  CLINICAL DATA:  Status post total right knee replacement EXAM: PORTABLE RIGHT KNEE - 1-2 VIEW COMPARISON:  05/06/2015 FINDINGS: Changes of total right knee replacement. No hardware complicating feature. Soft tissue and joint space gas present. No complicating features. IMPRESSION: Right knee replacement.  No complicating feature. Electronically Signed   By: Lennette Bihari  [place] M.D.   On: 05/14/2015 11:39    EKG: Orders placed or performed during the hospital encounter of 05/06/15  . EKG 12 lead  . EKG 12 lead     Hospital Course: Nicholas Cooke is a 64 y.o. who was admitted to [facility]. They were brought to the operating room on 05/14/2015 and underwent Procedure(s): REMOVAL OF HARDWARE AND RIGHT TOTAL KNEE ARTHROPLASTY.  Patient tolerated the procedure well and was later transferred to the recovery room and then to the orthopaedic floor for postoperative care.  They were given PO and IV analgesics for pain control following their surgery.  They were given 24 hours of postoperative antibiotics of  Anti-infectives    Start     Dose/Rate Route Frequency Ordered Stop   05/14/15 1400  ceFAZolin (ANCEF) IVPB 2 g/50 mL premix     2 g 100 mL/hr over 30 Minutes Intravenous Every 6 hours 05/14/15 1148 05/15/15 0142   05/14/15 0819  polymyxin B 500,000 Units, bacitracin 50,000 Units in sodium chloride irrigation 0.9 % 500 mL irrigation  Status:  Discontinued       As needed 05/14/15 0819 05/14/15 1030   05/14/15 0600  ceFAZolin (ANCEF) IVPB 2 g/50 mL premix     2 g 100 mL/hr over 30 Minutes Intravenous On call to O.R. 05/14/15 0533 05/14/15 0730     and started on DVT prophylaxis in the form of Xarelto, TED hose and SCDs.   PT and OT were ordered for total joint protocol.  Discharge planning consulted to help with postop disposition and equipment needs.  Patient had a good night on the  evening of surgery.  They started to get up OOB with therapy on day one.  Continued to work with therapy into day two. By day two, the patient had progressed with therapy and meeting their goals.  Incision was healing well.  Patient was seen in rounds and was ready to go home.   Diet: Regular diet Activity:WBAT Follow-up:in 10-14 days Disposition - Home Discharged Condition: good   Discharge Instructions    Call MD / Call 911    Complete by:  As directed   If you experience chest pain or shortness of breath, CALL 911 and be transported to the hospital emergency room.  If you develope a fever above 101 F, pus (white drainage) or increased drainage or redness at the wound, or calf pain, call your surgeon's office.     Constipation Prevention    Complete by:  As directed   Drink plenty of fluids.  Prune juice may be helpful.  You may use a stool softener, such as Colace (over the counter) 100 mg twice a day.  Use MiraLax (over the counter) for constipation as needed.     Diet - low sodium heart healthy    Complete by:  As directed      Increase activity slowly as tolerated    Complete by:  As directed             Medication List    STOP taking these medications        ibuprofen 800 MG tablet  Commonly known as:  ADVIL,MOTRIN      TAKE these medications        CIALIS 5 MG tablet  Generic drug:  tadalafil  Take 5 mg by mouth daily.     docusate sodium 100 MG capsule  Commonly known as:  COLACE  Take 1 capsule (100 mg  total) by mouth 2 (two) times daily as needed for mild constipation.     fenofibrate 160 MG tablet  Take 160 mg by mouth daily.     finasteride 5 MG tablet  Commonly known as:  PROSCAR  Take 5 mg by mouth daily.     imipramine 25 MG tablet  Commonly known as:  TOFRANIL  Take 25 mg by mouth at bedtime.     lisinopril 10 MG tablet  Commonly known as:  PRINIVIL,ZESTRIL  Take 10 mg by mouth daily.     loratadine 10 MG tablet  Commonly known as:  CLARITIN   Take 10 mg by mouth daily as needed for allergies.     methocarbamol 500 MG tablet  Commonly known as:  ROBAXIN  Take 1 tablet (500 mg total) by mouth every 8 (eight) hours as needed for muscle spasms.     oxyCODONE-acetaminophen 5-325 MG tablet  Commonly known as:  PERCOCET  Take 1 tablet by mouth every 4 (four) hours as needed.     rivaroxaban 10 MG Tabs tablet  Commonly known as:  XARELTO  Take 1 tablet (10 mg total) by mouth daily.     SYSTANE OP  Apply 1 drop to eye daily as needed (dry eyes).           Follow-up Information    Follow up with BEANE,JEFFREY C, MD In 2 weeks.   Specialty:  Orthopedic Surgery   Why:  For suture removal   Contact information:   [address] [place] [address] [phone]       Follow up with [facility].   Why:  physical therapy   Contact information:   [phone] ref # [id number]      Signed: Lacie Draft, PA-C Orthopaedic Surgery 05/16/2015, 9:56 AM

## 2015-11-24 ENCOUNTER — Other Ambulatory Visit: Payer: Self-pay | Admitting: Surgery

## 2015-12-15 DIAGNOSIS — K4090 Unilateral inguinal hernia, without obstruction or gangrene, not specified as recurrent: Secondary | ICD-10-CM

## 2015-12-15 HISTORY — DX: Unilateral inguinal hernia, without obstruction or gangrene, not specified as recurrent: K40.90

## 2015-12-18 ENCOUNTER — Encounter (HOSPITAL_BASED_OUTPATIENT_CLINIC_OR_DEPARTMENT_OTHER): Payer: Self-pay | Admitting: *Deleted

## 2015-12-22 NOTE — H&P (Signed)
Nicholas Cooke 11/24/2015 9:04 AM Location: [facility] Patient #: [id number] DOB: Mar 28, 1951 Married / Language: English / Race: Black or African American Male   History of Present Illness (Matthias Bogus A. Magnus IvanBlackman MD; 11/24/2015 9:20 AM) The patient is a 65 year old male who presents with an inguinal hernia. This is a gentleman who I performed a left inguinal hernia with mesh in 2013. He has been having increasing left groin discomfort over the past several months. He describes a dull ache and a bulge. He has no objective symptoms and is otherwise without complaints. He has no complete regarding the right groin.   Other Problems Fay Records(Ashley Beck, CMA; 11/24/2015 9:04 AM) Enlarged Prostate Inguinal Hernia  Past Surgical History Fay Records(Ashley Beck, CMA; 11/24/2015 9:04 AM) Appendectomy Colon Polyp Removal - Colonoscopy Knee Surgery Left. Open Inguinal Hernia Surgery Left. Oral Surgery Shoulder Surgery Left.  Diagnostic Studies History Fay Records[place]Ash[place]; 11/24/2015 9:04 AM) Colonoscopy 1-5 years ago  Allergies Fay Records(Ashley Beck, CMA; 11/24/2015 9:04 AM) No Known Drug Allergies07/03/2016  Medication History Fay Records(Ashley Beck, CMA; 11/24/2015 9:05 AM) Lisinopril (10MG  Tablet, Oral) Active. Imipramine HCl (25MG  Tablet, Oral) Active. Medications Reconciled  Social History Fay Records(Ashley Beck[place]; 11/24/2015 9:04 AM) Alcohol use Remotely quit alcohol use. Caffeine use Carbonated beverages. Illicit drug use Remotely quit drug use. Tobacco use Never smoker.  Family History Fay Records(Ashley [place], [place]; 11/24/2015 9:04 AM) Alcohol Abuse Father. Arthritis Mother. Diabetes Mellitus Brother, Mother. Heart Disease Brother. Hypertension Brother. Prostate Cancer Brother.    Review of Systems Fay Records(Ashley Beck CMA; 11/24/2015 9:04 AM) General Not Present- Appetite Loss, Chills, Fatigue, Fever, Night Sweats, Weight Gain and Weight Loss. Skin Not Present- Change in Wart/Mole, Dryness, Hives,  Jaundice, New Lesions, Non-Healing Wounds, Rash and Ulcer. HEENT Present- Ringing in the Ears and Wears glasses/contact lenses. Not Present- Earache, Hearing Loss, Hoarseness, Nose Bleed, Oral Ulcers, Seasonal Allergies, Sinus Pain, Sore Throat, Visual Disturbances and Yellow Eyes. Respiratory Not Present- Bloody sputum, Chronic Cough, Difficulty Breathing, Snoring and Wheezing. Cardiovascular Present- Swelling of Extremities. Not Present- Chest Pain, Difficulty Breathing Lying Down, Leg Cramps, Palpitations, Rapid Heart Rate and Shortness of Breath. Gastrointestinal Present- Abdominal Pain and Excessive gas. Not Present- Bloating, Bloody Stool, Change in Bowel Habits, Chronic diarrhea, Constipation, Difficulty Swallowing, Gets full quickly at meals, Hemorrhoids, Indigestion, Nausea, Rectal Pain and Vomiting. Male Genitourinary Present- Nocturia and Urine Leakage. Not Present- Blood in Urine, Change in Urinary Stream, Frequency, Impotence, Painful Urination and Urgency. Musculoskeletal Present- Joint Pain, Joint Stiffness, Muscle Pain, Muscle Weakness and Swelling of Extremities. Not Present- Back Pain. Neurological Not Present- Decreased Memory, Fainting, Headaches, Numbness, Seizures, Tingling, Tremor, Trouble walking and Weakness. Psychiatric Not Present- Anxiety, Bipolar, Change in Sleep Pattern, Depression, Fearful and Frequent crying. Endocrine Not Present- Cold Intolerance, Excessive Hunger, Hair Changes, Heat Intolerance, Hot flashes and New Diabetes. Hematology Not Present- Blood Thinners, Easy Bruising, Excessive bleeding, Gland problems, HIV and Persistent Infections.  Vitals Fay Records(Ashley Beck CMA; 11/24/2015 9:05 AM) 11/24/2015 9:05 AM Weight: 185 lb Height: 69in Body Surface Area: 2 m Body Mass Index: 27.32 kg/m  Temp.: 97.26F(Temporal)  Pulse: 90 (Regular)  BP: 132/74 (Sitting, Left Arm, Standard)       Physical Exam (Sheron Robin A. Magnus IvanBlackman MD; 11/24/2015 9:22  AM) General Mental Status-Alert. General Appearance-Consistent with stated age. Hydration-Well hydrated. Voice-Normal.  Head and Neck Head-normocephalic, atraumatic with no lesions or palpable masses. Trachea-midline.  Eye Eyeball - Bilateral-Extraocular movements intact. Sclera/Conjunctiva - Bilateral-No scleral icterus.  Chest and Lung Exam Chest and lung exam reveals -  quiet, even and easy respiratory effort with no use of accessory muscles and on auscultation, normal breath sounds, no adventitious sounds and normal vocal resonance. Inspection Chest Wall - Normal. Back - normal.  Cardiovascular Cardiovascular examination reveals -normal heart sounds, regular rate and rhythm with no murmurs and normal pedal pulses bilaterally.  Abdomen Inspection Skin - Scar - no surgical scars. Hernias - Inguinal hernia - Left - Incarcerated. Note: There is a tender left inguinal hernia which is difficult to reduce. I suspect contains omentum. Right - Note: There is no true right inguinal hernia and groin feels very weak. Palpation/Percussion Palpation and Percussion of the abdomen reveal - Soft, Non Tender, No Rebound tenderness, No Rigidity (guarding) and No hepatosplenomegaly. Auscultation Auscultation of the abdomen reveals - Bowel sounds normal.  Neurologic - Did not examine.  Musculoskeletal Normal Exam - Left-Upper Extremity Strength Normal and Lower Extremity Strength Normal. Normal Exam - Right-Upper Extremity Strength Normal, Lower Extremity Weakness.    Assessment & Plan (Nabil Bubolz A. 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 Hernia repair with mesh again is recommended. I recommend a laparoscopic left inguinal hernia repair with mesh. His allow me to evaluate the right groin place mesh there as well there is indeed a true hernia. I discussed the  risk of surgery which includes but is not limited to bleeding, infection, nerve entrapment, chronic pain, recurrent hernia, need for further surgery, postoperative recovery, etc. He understands and wished to proceed with surgery which will be scheduled

## 2015-12-23 ENCOUNTER — Encounter (HOSPITAL_BASED_OUTPATIENT_CLINIC_OR_DEPARTMENT_OTHER): Payer: Self-pay

## 2015-12-23 ENCOUNTER — Ambulatory Visit (HOSPITAL_BASED_OUTPATIENT_CLINIC_OR_DEPARTMENT_OTHER): Payer: Commercial Managed Care - HMO | Admitting: Anesthesiology

## 2015-12-23 ENCOUNTER — Encounter (HOSPITAL_BASED_OUTPATIENT_CLINIC_OR_DEPARTMENT_OTHER)
Admission: RE | Disposition: A | Discharge status: Home / Self Care | Payer: Self-pay | Source: Ambulatory Visit | Attending: Surgery

## 2015-12-23 ENCOUNTER — Ambulatory Visit (HOSPITAL_BASED_OUTPATIENT_CLINIC_OR_DEPARTMENT_OTHER)
Admission: RE | Admit: 2015-12-23 | Discharge: 2015-12-23 | Disposition: A | Discharge status: Home / Self Care | Payer: Commercial Managed Care - HMO | Source: Ambulatory Visit | Attending: Surgery | Admitting: Surgery

## 2015-12-23 DIAGNOSIS — K4091 Unilateral inguinal hernia, without obstruction or gangrene, recurrent: Secondary | ICD-10-CM | POA: Diagnosis present

## 2015-12-23 DIAGNOSIS — K4020 Bilateral inguinal hernia, without obstruction or gangrene, not specified as recurrent: Secondary | ICD-10-CM | POA: Diagnosis not present

## 2015-12-23 DIAGNOSIS — I10 Essential (primary) hypertension: Secondary | ICD-10-CM | POA: Diagnosis not present

## 2015-12-23 HISTORY — DX: Unilateral inguinal hernia, without obstruction or gangrene, not specified as recurrent: K40.90

## 2015-12-23 HISTORY — PX: INGUINAL HERNIA REPAIR: SHX194

## 2015-12-23 HISTORY — PX: INSERTION OF MESH: SHX5868

## 2015-12-23 HISTORY — DX: Unspecified osteoarthritis, unspecified site: M19.90

## 2015-12-23 HISTORY — DX: Dental restoration status: Z98.811

## 2015-12-23 SURGERY — REPAIR, HERNIA, INGUINAL, LAPAROSCOPIC
Anesthesia: General | Site: Groin | Laterality: Bilateral

## 2015-12-23 MED ORDER — CEFAZOLIN SODIUM-DEXTROSE 2-4 GM/100ML-% IV SOLN
2.0000 g | INTRAVENOUS | Status: AC
  Administered 2015-12-23: 2 g via INTRAVENOUS

## 2015-12-23 MED ORDER — CHLORHEXIDINE GLUCONATE CLOTH 2 % EX PADS: 6.0000 | MEDICATED_PAD | Freq: Once | CUTANEOUS | Status: DC

## 2015-12-23 MED ORDER — LACTATED RINGERS IV SOLN
INTRAVENOUS | Status: DC
  Administered 2015-12-23 (×3): via INTRAVENOUS

## 2015-12-23 MED ORDER — GLYCOPYRROLATE 0.2 MG/ML IJ SOLN: 0.2000 mg | Freq: Once | INTRAMUSCULAR | Status: DC | PRN

## 2015-12-23 MED ORDER — MIDAZOLAM HCL 2 MG/2ML IJ SOLN
1.0000 mg | INTRAMUSCULAR | Status: DC | PRN
  Administered 2015-12-23: 2 mg via INTRAVENOUS

## 2015-12-23 MED ORDER — MEPERIDINE HCL 25 MG/ML IJ SOLN: 6.2500 mg | INTRAMUSCULAR | Status: DC | PRN

## 2015-12-23 MED ORDER — OXYCODONE HCL 5 MG PO TABS: 5.0000 mg | ORAL_TABLET | Freq: Once | ORAL | Status: DC | PRN

## 2015-12-23 MED ORDER — ONDANSETRON HCL 4 MG/2ML IJ SOLN: 4.0000 mg | Freq: Once | INTRAMUSCULAR | Status: DC | PRN

## 2015-12-23 MED ORDER — SUGAMMADEX SODIUM 200 MG/2ML IV SOLN
INTRAVENOUS | Status: DC | PRN
  Administered 2015-12-23: 200 mg via INTRAVENOUS

## 2015-12-23 MED ORDER — OXYCODONE-ACETAMINOPHEN 5-325 MG PO TABS: 1.0000 | ORAL_TABLET | ORAL | 0 refills | Status: DC | PRN

## 2015-12-23 MED ORDER — LIDOCAINE 2% (20 MG/ML) 5 ML SYRINGE
INTRAMUSCULAR | Status: AC
  Filled 2015-12-23: qty 5

## 2015-12-23 MED ORDER — FENTANYL CITRATE (PF) 100 MCG/2ML IJ SOLN
50.0000 ug | INTRAMUSCULAR | Status: DC | PRN
  Administered 2015-12-23: 100 ug via INTRAVENOUS

## 2015-12-23 MED ORDER — ONDANSETRON HCL 4 MG/2ML IJ SOLN
INTRAMUSCULAR | Status: DC | PRN
  Administered 2015-12-23: 4 mg via INTRAVENOUS

## 2015-12-23 MED ORDER — DEXAMETHASONE SODIUM PHOSPHATE 4 MG/ML IJ SOLN
INTRAMUSCULAR | Status: DC | PRN
  Administered 2015-12-23: 10 mg via INTRAVENOUS

## 2015-12-23 MED ORDER — PHENYLEPHRINE HCL 10 MG/ML IJ SOLN
INTRAMUSCULAR | Status: DC | PRN
  Administered 2015-12-23 (×2): 80 ug via INTRAVENOUS
  Administered 2015-12-23: 40 ug via INTRAVENOUS

## 2015-12-23 MED ORDER — MIDAZOLAM HCL 2 MG/2ML IJ SOLN
INTRAMUSCULAR | Status: AC
  Filled 2015-12-23: qty 2

## 2015-12-23 MED ORDER — SCOPOLAMINE 1 MG/3DAYS TD PT72: 1.0000 | MEDICATED_PATCH | Freq: Once | TRANSDERMAL | Status: DC | PRN

## 2015-12-23 MED ORDER — ONDANSETRON HCL 4 MG/2ML IJ SOLN
INTRAMUSCULAR | Status: AC
  Filled 2015-12-23: qty 2

## 2015-12-23 MED ORDER — DEXAMETHASONE SODIUM PHOSPHATE 10 MG/ML IJ SOLN
INTRAMUSCULAR | Status: AC
  Filled 2015-12-23: qty 1

## 2015-12-23 MED ORDER — KETOROLAC TROMETHAMINE 30 MG/ML IJ SOLN
INTRAMUSCULAR | Status: AC
  Filled 2015-12-23: qty 1

## 2015-12-23 MED ORDER — KETOROLAC TROMETHAMINE 30 MG/ML IJ SOLN
INTRAMUSCULAR | Status: DC | PRN
  Administered 2015-12-23: 30 mg via INTRAVENOUS

## 2015-12-23 MED ORDER — BUPIVACAINE-EPINEPHRINE (PF) 0.5% -1:200000 IJ SOLN
INTRAMUSCULAR | Status: DC | PRN
  Administered 2015-12-23: 20 mL

## 2015-12-23 MED ORDER — HYDROMORPHONE HCL 1 MG/ML IJ SOLN
INTRAMUSCULAR | Status: AC
  Filled 2015-12-23: qty 1

## 2015-12-23 MED ORDER — HYDROMORPHONE HCL 1 MG/ML IJ SOLN
0.2500 mg | INTRAMUSCULAR | Status: DC | PRN
  Administered 2015-12-23: 0.5 mg via INTRAVENOUS

## 2015-12-23 MED ORDER — SUCCINYLCHOLINE CHLORIDE 20 MG/ML IJ SOLN
INTRAMUSCULAR | Status: DC | PRN
  Administered 2015-12-23: 120 mg via INTRAVENOUS

## 2015-12-23 MED ORDER — FENTANYL CITRATE (PF) 100 MCG/2ML IJ SOLN
INTRAMUSCULAR | Status: AC
  Filled 2015-12-23: qty 2

## 2015-12-23 MED ORDER — LIDOCAINE HCL (CARDIAC) 20 MG/ML IV SOLN
INTRAVENOUS | Status: DC | PRN
  Administered 2015-12-23: 100 mg via INTRAVENOUS
  Administered 2015-12-23: 1 mg via INTRAVENOUS

## 2015-12-23 MED ORDER — ROCURONIUM BROMIDE 100 MG/10ML IV SOLN
INTRAVENOUS | Status: DC | PRN
  Administered 2015-12-23: 30 mg via INTRAVENOUS

## 2015-12-23 MED ORDER — OXYCODONE HCL 5 MG/5ML PO SOLN: 5.0000 mg | Freq: Once | ORAL | Status: DC | PRN

## 2015-12-23 MED ORDER — PROPOFOL 10 MG/ML IV BOLUS
INTRAVENOUS | Status: DC | PRN
  Administered 2015-12-23: 200 mg via INTRAVENOUS

## 2015-12-23 MED ORDER — SUCCINYLCHOLINE CHLORIDE 200 MG/10ML IV SOSY
PREFILLED_SYRINGE | INTRAVENOUS | Status: AC
  Filled 2015-12-23: qty 10

## 2015-12-23 MED ORDER — CEFAZOLIN SODIUM-DEXTROSE 2-4 GM/100ML-% IV SOLN
INTRAVENOUS | Status: AC
  Filled 2015-12-23: qty 100

## 2015-12-23 SURGICAL SUPPLY — 35 items
APPLIER CLIP LOGIC TI 5 (MISCELLANEOUS) IMPLANT
APR CLP MED LRG 33X5 (MISCELLANEOUS)
BLADE CLIPPER SURG (BLADE) ×1 IMPLANT
CHLORAPREP W/TINT 26ML (MISCELLANEOUS) ×2 IMPLANT
DEVICE SECURE STRAP 25 ABSORB (INSTRUMENTS) ×2 IMPLANT
DISSECT BALLN SPACEMKR + OVL (BALLOONS) ×2
DISSECTOR BALLN SPACEMKR + OVL (BALLOONS) ×1 IMPLANT
DISSECTOR BLUNT TIP ENDO 5MM (MISCELLANEOUS) IMPLANT
ELECT REM PT RETURN 9FT ADLT (ELECTROSURGICAL) ×2
ELECTRODE REM PT RTRN 9FT ADLT (ELECTROSURGICAL) ×1 IMPLANT
GLOVE BIO SURGEON STRL SZ7.5 (GLOVE) ×1 IMPLANT
GLOVE BIOGEL PI IND STRL 7.0 (GLOVE) IMPLANT
GLOVE BIOGEL PI IND STRL 7.5 (GLOVE) IMPLANT
GLOVE BIOGEL PI INDICATOR 7.0 (GLOVE) ×1
GLOVE BIOGEL PI INDICATOR 7.5 (GLOVE) ×1
GLOVE SURG SIGNA 7.5 PF LTX (GLOVE) ×2 IMPLANT
GOWN STRL REUS W/ TWL LRG LVL3 (GOWN DISPOSABLE) ×1 IMPLANT
GOWN STRL REUS W/ TWL XL LVL3 (GOWN DISPOSABLE) ×1 IMPLANT
GOWN STRL REUS W/TWL LRG LVL3 (GOWN DISPOSABLE) ×2
GOWN STRL REUS W/TWL XL LVL3 (GOWN DISPOSABLE) ×2
LIQUID BAND (GAUZE/BANDAGES/DRESSINGS) ×4 IMPLANT
MESH 3DMAX 4X6 LT LRG (Mesh General) ×1 IMPLANT
MESH 3DMAX 4X6 RT LRG (Mesh General) ×1 IMPLANT
NDL INSUFFLATION 14GA 120MM (NEEDLE) IMPLANT
NEEDLE INSUFFLATION 14GA 120MM (NEEDLE) IMPLANT
PACK BASIN DAY SURGERY FS (CUSTOM PROCEDURE TRAY) ×2 IMPLANT
SCISSORS LAP 5X35 DISP (ENDOMECHANICALS) IMPLANT
SET IRRIG TUBING LAPAROSCOPIC (IRRIGATION / IRRIGATOR) IMPLANT
SET TROCAR LAP APPLE-HUNT 5MM (ENDOMECHANICALS) ×2 IMPLANT
SLEEVE SCD COMPRESS KNEE MED (MISCELLANEOUS) ×2 IMPLANT
SUT MNCRL AB 4-0 PS2 18 (SUTURE) ×2 IMPLANT
TOWEL OR 17X24 6PK STRL BLUE (TOWEL DISPOSABLE) ×2 IMPLANT
TRAY FOLEY CATH SILVER 16FR (SET/KITS/TRAYS/PACK) ×2 IMPLANT
TRAY LAPAROSCOPIC (CUSTOM PROCEDURE TRAY) ×2 IMPLANT
TUBING INSUFFLATION (TUBING) ×2 IMPLANT

## 2015-12-23 NOTE — Anesthesia Preprocedure Evaluation (Signed)
Anesthesia Evaluation  Patient identified by MRN, date of birth, ID band Patient awake    Reviewed: Allergy & Precautions, NPO status , Patient's Chart, lab work & pertinent test results  Airway Mallampati: I  TM Distance: >3 FB Neck ROM: Full    Dental  (+) Teeth Intact, Dental Advisory Given   Pulmonary  breath sounds clear to auscultation        Cardiovascular hypertension, Pt. on medications Rhythm:Regular Rate:Normal     Neuro/Psych    GI/Hepatic   Endo/Other    Renal/GU      Musculoskeletal   Abdominal   Peds  Hematology   Anesthesia Other Findings   Reproductive/Obstetrics                             Anesthesia Physical Anesthesia Plan  ASA: II  Anesthesia Plan: General   Post-op Pain Management:    Induction: Intravenous  Airway Management Planned: Oral ETT  Additional Equipment:   Intra-op Plan:   Post-operative Plan: Extubation in OR  Informed Consent: I have reviewed the patients History and Physical, chart, labs and discussed the procedure including the risks, benefits and alternatives for the proposed anesthesia with the patient or authorized representative who has indicated his/her understanding and acceptance.   Dental advisory given  Plan Discussed with: CRNA, Anesthesiologist and Surgeon  Anesthesia Plan Comments:         Anesthesia Quick Evaluation  

## 2015-12-23 NOTE — Op Note (Signed)
[place]:  Hoeger, Nicholas                ACCOUNT NO.:  [id number]  MEDICAL RECORD NO.:  [id number]  LOCATION:                                 FACILITY:  PHYSICIAN:  Abigail Miyamotoouglas Karmella Bouvier, M.D. DATE OF BIRTH:  Jun 12, 1950  DATE OF PROCEDURE:  12/23/2015 DATE OF DISCHARGE:                              OPERATIVE REPORT   PREOPERATIVE DIAGNOSIS:  Recurrent left inguinal hernia.  POSTOPERATIVE DIAGNOSIS:  Bilateral inguinal hernia.  PROCEDURE:  Bilateral laparoscopic inguinal hernia repair with mesh.  SURGEON:  Abigail Miyamotoouglas Robyne Matar, M.D.  ANESTHESIA:  General with 0.5% Marcaine.  ESTIMATED BLOOD LOSS:  Minimal.  FINDINGS:  The patient was found to have a left recurrent indirect inguinal hernia as well as a right small direct and indirect inguinal hernia.  Both were repaired with Bard 3DMax Prolene large mesh.  PROCEDURE IN DETAIL:  The patient was brought to the operating room, identified as Nicholas Cooke.  He was placed supine on the operating room table and general anesthesia was induced.  His abdomen was then prepped and draped in usual sterile fashion.  I made a small vertical incision below the umbilicus.  I carried this down to the fascia which opened just left of the midline.  The rectus muscle was then elevated.  The dissecting balloon was then passed underneath the rectus muscle and manipulated toward the pubis.  The dissecting balloon was then insufflated under direct vision dissecting out the preperitoneal space. The dissecting balloon was then removed and insufflation was begun with carbon dioxide.  I then placed two 5-mm ports in the patient's midline under direct vision.  I evaluated the left inguinal area first.  The patient had a large indirect hernia sac from recurrent hernia.  I was able to reduce this sac and all the contents of the sac back into the abdominal cavity.  The sac did tear slightly during this.  I then evaluated the right inguinal area.  The patient had a very  small direct hernia defect as well as an indirect hernia sac.  I was able to reduce the sac from the cord structures on the right side as well.  Next, I brought a piece of Bard 3DMax right-sided large mesh onto the field.  I placed it through the fascial opening and opened as an onlay on the right inguinal floor.  I then tacked it to [place]oper ligament up the medial abdominal wall and slightly laterally with the absorbable tacker.  Next, I brought a left-sided Bard 3DMax large mesh onto the field as well.  I placed it through the umbilical trocar and opened as an onlay on the left inguinal floor.  I then tacked it to Ellistonooper ligament up the medial abdominal wall slightly laterally as well.  Both these mesh appeared to widely cover the inguinal floor and cord structures.  At this point, hemostasis appeared to be achieved.  All ports were removed under direct vision, and the preperitoneal space seen to collapse appropriately.  I then removed the umbilical port and deflated the abdomen.  The fascia at the umbilicus was closed with figure-of-eight 0 Vicryl suture.  I anesthetized all incisions with Marcaine and attempted bilateral ilioinguinal nerve  blocks with Marcaine as well.  All skin incisions were then closed with 4-0 Monocryl sutures.  Skin glue was then applied.  The patient tolerated the procedure well.  All sponge, needle, and instrument counts were correct at the end of the procedure.  The patient was then extubated in the operating room and taken in a stable condition to the recovery room.     Abigail Miyamoto, M.D.   ______________________________ Abigail Miyamoto, M.D.    DB/MEDQ  D:  12/23/2015  T:  12/23/2015  Job:  [id number]

## 2015-12-23 NOTE — Interval H&P Note (Signed)
History and Physical Interval Note: no change in H and P  12/23/2015 10:14 AM  Nicholas Cooke  has presented today for surgery, with the diagnosis of Left inguinal hernia   The various methods of treatment have been discussed with the patient and family. After consideration of risks, benefits and other options for treatment, the patient has consented to  Procedure(s): LAPAROSCOPIC LEFT INGUINAL HERNIA WITH MESH (Left) INSERTION OF MESH (Left) as a surgical intervention .  The patient's history has been reviewed, patient examined, no change in status, stable for surgery.  I have reviewed the patient's chart and labs.  Questions were answered to the patient's satisfaction.     [name]

## 2015-12-23 NOTE — Op Note (Signed)
LAPAROSCOPIC LEFT INGUINAL HERNIA WITH MESH, INSERTION OF MESH  Procedure Note  Teofilo PodWilliam L Geng 12/23/2015   Pre-op Diagnosis: recurrent left inguinal hernia      Post-op Diagnosis:bilateral inguinal hernia  Procedure(s): LAPAROSCOPIC bilateral INGUINAL HERNIA WITH MESH INSERTION OF MESH  Surgeon(s): Abigail Miyamotoouglas Roberts Bon, MD  Anesthesia: General  Staff:  Circulator: Randalyn RheaPattie T Caviness, RN Relief Circulator: Nelma Rothmanichard J Amaya, RN Scrub Person: Raliegh ScarletJudy G Burroughs, RN  Estimated Blood Loss: Minimal                         Maranda Marte A   Date: 12/23/2015  Time: 12:21 PM

## 2015-12-23 NOTE — Transfer of Care (Signed)
Immediate Anesthesia Transfer of Care Note  Patient: Nicholas Cooke  Procedure(s) Performed: Procedure(s): LAPAROSCOPIC LEFT INGUINAL HERNIA WITH MESH (Bilateral) INSERTION OF MESH (Left)  Patient Location: PACU  Anesthesia Type:General  Level of Consciousness: sedated  Airway & Oxygen Therapy: Patient Spontanous Breathing and Patient connected to face mask oxygen  Post-op Assessment: Report given to RN and Post -op Vital signs reviewed and stable  Post vital signs: Reviewed and stable  Last Vitals:  Vitals:   12/23/15 1230 12/23/15 1231  BP: 121/72   Pulse: 85 85  Resp:  (!) 22  Temp:      Last Pain:  Vitals:   12/23/15 1050  TempSrc: Oral  PainSc:          Complications: No apparent anesthesia complications

## 2015-12-23 NOTE — Discharge Instructions (Signed)
[facility] _______[facility] ° °UMBILICAL OR INGUINAL HERNIA REPAIR: POST OP INSTRUCTIONS ° °Always review your discharge instruction sheet given to you by the facility where your surgery was performed. °IF YOU HAVE DISABILITY OR FAMILY LEAVE FORMS, YOU MUST BRING THEM TO THE OFFICE FOR PROCESSING.   °DO NOT GIVE THEM TO YOUR DOCTOR. ° °1. A  prescription for pain medication may be given to you upon discharge.  Take your pain medication as prescribed, if needed.  If narcotic pain medicine is not needed, then you may take acetaminophen (Tylenol) or ibuprofen (Advil) as needed. °2. Take your usually prescribed medications unless otherwise directed. °3. If you need a refill on your pain medication, please contact your pharmacy.  They will contact our office to request authorization. Prescriptions will not be filled after 5 pm or on week-ends. °4. You should follow a light diet the first 24 hours after arrival home, such as soup and crackers, etc.  Be sure to include lots of fluids daily.  Resume your normal diet the day after surgery. °5. Most patients will experience some swelling and bruising around the umbilicus or in the groin and scrotum.  Ice packs and reclining will help.  Swelling and bruising can take several days to resolve.  °6. It is common to experience some constipation if taking pain medication after surgery.  Increasing fluid intake and taking a stool softener (such as Colace) will usually help or prevent this problem from occurring.  A mild laxative (Milk of Magnesia or Miralax) should be taken according to package directions if there are no bowel movements after 48 hours. °7. Unless discharge instructions indicate otherwise, you may remove your bandages 24-48 hours after surgery, and you may shower at that time.  You may have steri-strips (small skin tapes) in place directly over the incision.  These strips should be left on the skin for 7-10 days.  If your surgeon used skin glue on the  incision, you may shower in 24 hours.  The glue will flake off over the next 2-3 weeks.  Any sutures or staples will be removed at the office during your follow-up visit. °8. ACTIVITIES:  You may resume regular (light) daily activities beginning the next day--such as daily self-care, walking, climbing stairs--gradually increasing activities as tolerated.  You may have sexual intercourse when it is comfortable.  Refrain from any heavy lifting or straining until approved by your doctor. °a. You may drive when you are no longer taking prescription pain medication, you can comfortably wear a seatbelt, and you can safely maneuver your car and apply brakes. °b. RETURN TO WORK:  __________________________________________________________ °9. You should see your doctor in the office for a follow-up appointment approximately 2-3 weeks after your surgery.  Make sure that you call for this appointment within a day or two after you arrive home to insure a convenient appointment time. °10. OTHER INSTRUCTIONS: NO LIFTING MORE THAN 15 POUNDS FOR 3 WEEKS °11. ICE PACK AND IBUPROFEN ALSO FOR PAIN __________________________________________________________________________________________________________________________________________________________________________________________  °WHEN TO CALL YOUR DOCTOR: °1. Fever over 101.0 °2. Inability to urinate °3. Nausea and/or vomiting °4. Extreme swelling or bruising °5. Continued bleeding from incision. °6. Increased pain, redness, or drainage from the incision ° °The clinic staff is available to answer your questions during regular business hours.  Please don’t hesitate to call and ask to speak to one of the nurses for clinical concerns.  If you have a medical emergency, go to the nearest emergency room or call 911.    A surgeon from [facility] is always on call at the hospital ° ° °[address], [address], [place], [place]  [address] ? ° [address] [address], [place], [place]    [address] °[phone] ? [phone] ? [phone] [phone] °Web site: [email or web address] ° ° ° ° °Post Anesthesia Home Care Instructions ° °Activity: °Get plenty of rest for the remainder of the day. A responsible adult should stay with you for 24 hours following the procedure.  °For the next 24 hours, DO NOT: °-Drive a car °-Operate machinery °-Drink alcoholic beverages °-Take any medication unless instructed by your physician °-Make any legal decisions or sign important papers. ° °Meals: °Start with liquid foods such as gelatin or soup. Progress to regular foods as tolerated. Avoid greasy, spicy, heavy foods. If nausea and/or vomiting occur, drink only clear liquids until the nausea and/or vomiting subsides. Call your physician if vomiting continues. ° °Special Instructions/Symptoms: °Your throat may feel dry or sore from the anesthesia or the breathing tube placed in your throat during surgery. If this causes discomfort, gargle with warm salt water. The discomfort should disappear within 24 hours. ° °If you had a scopolamine patch placed behind your ear for the management of post- operative nausea and/or vomiting: ° °1. The medication in the patch is effective for 72 hours, after which it should be removed.  Wrap patch in a tissue and discard in the trash. Wash hands thoroughly with soap and water. °2. You may remove the patch earlier than 72 hours if you experience unpleasant side effects which may include dry mouth, dizziness or visual disturbances. °3. Avoid touching the patch. Wash your hands with soap and water after contact with the patch. °  ° °

## 2015-12-23 NOTE — Anesthesia Procedure Notes (Signed)
Procedure Name: Intubation Date/Time: 12/23/2015 11:36 AM Performed by: Caren MacadamARTER, Azarya Oconnell W Pre-anesthesia Checklist: Patient identified, Emergency Drugs available, Suction available and Patient being monitored Patient Re-evaluated:Patient Re-evaluated prior to inductionOxygen Delivery Method: Circle system utilized Preoxygenation: Pre-oxygenation with 100% oxygen Intubation Type: IV induction Ventilation: Mask ventilation without difficulty Laryngoscope Size: Miller and 2 Grade View: Grade I Tube type: Oral Tube size: 8.0 mm Number of attempts: 1 Airway Equipment and Method: Stylet and Oral airway Placement Confirmation: ETT inserted through vocal cords under direct vision,  positive ETCO2 and breath sounds checked- equal and bilateral Secured at: 22 cm Tube secured with: Tape Dental Injury: Teeth and Oropharynx as per pre-operative assessment

## 2015-12-23 NOTE — Anesthesia Postprocedure Evaluation (Signed)
Anesthesia Post Note  Patient: Nicholas Cooke  Procedure(s) Performed: Procedure(s) (LRB): LAPAROSCOPIC LEFT INGUINAL HERNIA WITH MESH (Bilateral) INSERTION OF MESH (Left)  Patient location during evaluation: PACU Anesthesia Type: General Level of consciousness: awake and alert Pain management: pain level controlled Vital Signs Assessment: post-procedure vital signs reviewed and stable Respiratory status: spontaneous breathing, nonlabored ventilation and respiratory function stable Cardiovascular status: blood pressure returned to baseline and stable Postop Assessment: no signs of nausea or vomiting Anesthetic complications: no    Last Vitals:  Vitals:   12/23/15 1245 12/23/15 1300  BP: 132/83 130/79  Pulse: 81 76  Resp: (!) 22 18  Temp:      Last Pain:  Vitals:   12/23/15 1230  TempSrc:   PainSc: Asleep                 Katalaya Beel A

## 2015-12-24 ENCOUNTER — Encounter (HOSPITAL_BASED_OUTPATIENT_CLINIC_OR_DEPARTMENT_OTHER): Payer: Self-pay | Admitting: Surgery

## 2018-03-15 DIAGNOSIS — J310 Chronic rhinitis: Secondary | ICD-10-CM | POA: Insufficient documentation

## 2018-03-15 DIAGNOSIS — H6123 Impacted cerumen, bilateral: Secondary | ICD-10-CM | POA: Insufficient documentation

## 2018-03-15 DIAGNOSIS — H9313 Tinnitus, bilateral: Secondary | ICD-10-CM | POA: Insufficient documentation

## 2018-05-30 ENCOUNTER — Other Ambulatory Visit: Payer: Self-pay | Admitting: Urology

## 2018-06-28 ENCOUNTER — Encounter (HOSPITAL_BASED_OUTPATIENT_CLINIC_OR_DEPARTMENT_OTHER): Payer: Self-pay | Admitting: *Deleted

## 2018-06-28 ENCOUNTER — Other Ambulatory Visit: Payer: Self-pay

## 2018-06-28 NOTE — Progress Notes (Signed)
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

## 2018-06-30 NOTE — H&P (Signed)
Office Visit Report     06/25/2018   --------------------------------------------------------------------------------   Nicholas Cooke. Jaquis Dupree  MRN: [id number]  PRIMARY CARE:    DOB: 04/12/1955, 68 year old Male  REFERRING:  L Lupe Carney, MD  SSN: -[id number]  PROVIDER:  Jerilee Cooke, M.D.    TREATING:  Anne Fu    LOCATION:  [facility] [phone]   --------------------------------------------------------------------------------   CC: I have symptoms of an enlarged prostate.  HPI: Nicholas Cooke is a 68 year-old male established patient who is here for symptoms of enlarged prostate.  His AUA symptom score was 33. On finasteride x 11 years. His shim is 14. Symptoms worse in 2018 - 2019. He strains to void. Hesitancy. Freq, urgency, nocturia. No incontinence or pad use. He used to carry a bottle while he driving, so that he could void. No LE edema. He snores and needs a separate bedroom. We added tamsulosin. No NG risk. No GU surgery. Retired from driving.   Cystoscopy Apr 2019 with lateral lobe hypertrophy. He has a better flow and nocturia down to 3. He's pleased on tamsulosin.   PVR 35 ml. On tamsulosin and finasteride but continued frequency. Daily tadalafil. RGE is bothersome. AUASS = 29. Urodynamics revealed a capacity of 220 mL, normal compliance, low amplitude instability but no leak. He voided with a low-pressure void but is slow flow which seemed to be consistent with obstruction. Apparent obstruction on cystogram. EMG quiet.   06/25/2018: Here today for pre-procedural HPI before Urolift performed with Dr Mena Goes next week on the 18th. Today he is very eager to proceed with the procedure. He reports no changes in bothersome voiding habits since last visit. He denies dysuria, gross hematuria, interval UTi treatment, no recent fevers.   He first noticed the symptoms approximately 07/15/2006. His symptoms have gotten worse over the last year. He has been  treated with Proscar. The patient has never had a surgical procedure for bladder outlet obstruction to his prostate.     ALLERGIES: Nkda    MEDICATIONS: Cialis 5 mg tablet  Finasteride 5 mg tablet  Tamsulosin Hcl 0.4 mg capsule 1 capsule PO Q HS  Imipramine Hcl  Lisinopril-Hydrochlorothiazide 10 mg-12.5 mg tablet  Tadalafil 5 mg tablet 1 tablet PO Daily     GU PSH: Complex cystometrogram, w/ void pressure and urethral pressure profile studies, any technique - 03/30/2018 Complex Uroflow - 03/30/2018 Cystoscopy - 08/21/2017 Emg surf Electrd - 03/30/2018 Inject For cystogram - 03/30/2018 Intrabd voidng Press - 03/30/2018      PSH Notes: Neuroplasty Ulnar Nerve, Appendectomy, Rotator Cuff Repair, Knee Surgery   NON-GU PSH: Appendectomy - 2008 Revise Arm/leg Nerve - 2008    GU PMH: BPH w/LUTS - 04/11/2018, - 03/16/2018, - 08/21/2017, - 07/19/2017, Benign prostatic hyperplasia with urinary obstruction, - 2014 Nocturia - 03/30/2018 ED due to arterial insufficiency - 07/19/2017 Straining on Urination - 07/19/2017 Urinary Frequency (Stable) - 07/19/2017, Increased urinary frequency, - 2014      PMH Notes:  1898-05-16 00:00:00 - Note: Normal Routine History And Physical Adult   NON-GU PMH: Personal history of other endocrine, nutritional and metabolic disease, History of hypercholesterolemia - 2014 Arthritis Glaucoma Hypercholesterolemia Hypertension    FAMILY HISTORY: Bone Cancer - Brother Family Health Status Number - Runs In Family Lung Cancer - Father Prostate Cancer - Brother stroke - Mother   SOCIAL HISTORY: Marital Status: Married Preferred Language: English; Ethnicity: Not Hispanic Or Latino; Race: Black or African American Current Smoking Status:  Patient has never smoked.   Tobacco Use Assessment Completed: Used Tobacco in last 30 days? Does not use smokeless tobacco. Has never drank.  Does not use drugs. Drinks 1 caffeinated drink per day. Has not had a blood  transfusion. Patient's occupation Water engineer.     Notes: Tobacco Use, Marital History - Currently Married, Alcohol Use, Caffeine Use   REVIEW OF SYSTEMS:    GU Review Male:   Patient reports frequent urination, hard to postpone urination, get up at night to urinate, leakage of urine, stream starts and stops, trouble starting your stream, and erection problems. Patient denies burning/ pain with urination, have to strain to urinate , and penile pain.  Gastrointestinal (Upper):   Patient denies nausea, vomiting, and indigestion/ heartburn.  Gastrointestinal (Lower):   Patient denies diarrhea and constipation.  Constitutional:   Patient reports night sweats and fatigue. Patient denies fever and weight loss.  Skin:   Patient denies itching and skin rash/ lesion.  Eyes:   Patient denies blurred vision and double vision.  Ears/ Nose/ Throat:   Patient denies sore throat and sinus problems.  Hematologic/Lymphatic:   Patient denies swollen glands and easy bruising.  Cardiovascular:   Patient denies leg swelling and chest pains.  Respiratory:   Patient denies cough and shortness of breath.  Endocrine:   Patient denies excessive thirst.  Musculoskeletal:   Patient reports joint pain. Patient denies back pain.  Neurological:   Patient denies headaches and dizziness.  Psychologic:   Patient denies depression and anxiety.   Notes: Updated from previous visit 04/11/2018 with review from patient as noted above.   VITAL SIGNS:      06/25/2018 12:51 PM  Weight 187 lb / 84.82 kg  Height 69 in / 175.26 cm  BP 120/81 mmHg  Pulse 97 /min  Temperature 98.1 F / 36.7 C  BMI 27.6 kg/m   MULTI-SYSTEM PHYSICAL EXAMINATION:    Constitutional: Well-nourished. No physical deformities. Normally developed. Good grooming.  Neck: Neck symmetrical, not swollen. Normal tracheal position.  Respiratory: Normal breath sounds. No labored breathing, no use of accessory muscles.   Cardiovascular: Regular  rate and rhythm. No murmur, no gallop. Normal temperature, normal extremity pulses, no swelling, no varicosities.   Skin: No paleness, no jaundice, no cyanosis. No lesion, no ulcer, no rash.  Neurologic / Psychiatric: Oriented to time, oriented to place, oriented to person. No depression, no anxiety, no agitation.  Gastrointestinal: No mass, no tenderness, no rigidity, non obese abdomen.  Musculoskeletal: Normal gait and station of head and neck.     PAST DATA REVIEWED:  Source Of History:  Patient  Records Review:   AUA Symptom Score, Previous Patient Records  Urine Test Review:   Urinalysis  Urodynamics Review:   Review Bladder Scan, Review Urodynamics Tests   07/19/17  PSA  Total PSA 0.32 ng/mL    PROCEDURES:          Urinalysis - 81003 Dipstick Dipstick Cont'd  Color: Yellow Bilirubin: Neg  Appearance: Clear Ketones: Trace  Specific Gravity: 1.030 Blood: Neg  pH: 5.5 Protein: Neg  Glucose: Neg Urobilinogen: 0.2    Nitrites: Neg    Leukocyte Esterase: Neg    Notes:      ASSESSMENT:      ICD-10 Details  1 GU:   BPH w/LUTS - N40.1   2   Nocturia - R35.1    PLAN:           Orders Labs Urine Culture  Schedule Return Visit/Planned Activity: Keep Scheduled Appointment - Follow up MD, Schedule Surgery          Document Letter(s):  Created for Patient: Clinical Summary         Notes:   All questions answered to the best of my ability about upcoming procedure with understanding expressed by the patient. A preprocedural urine culture will be sent today. He'll keep scheduled appointment for Urolift on 07/03/2018 with Dr Mena GoesEskridge.        Next Appointment:      Next Appointment: 07/03/2018 09:15 AM    Appointment Type: Surgery     Location: [facility] [phone]    Provider: Jerilee FieldMatthew Janmarie Smoot, M.D.    Reason for Visit: NE/OP UROLIFT      * Signed by Anne FuLarry Gibson on 06/25/18 at 1:45 PM (EST)*     The information contained in this  medical record document is considered private and confidential patient information. This information can only be used for the medical diagnosis and/or medical services that are being provided by the patient's selected caregivers. This information can only be distributed outside of the patient's care if the patient agrees and signs waivers of authorization for this information to be sent to an outside source or route.

## 2018-07-03 ENCOUNTER — Encounter (HOSPITAL_BASED_OUTPATIENT_CLINIC_OR_DEPARTMENT_OTHER)
Admission: RE | Disposition: A | Discharge status: Home / Self Care | Payer: Self-pay | Source: Home / Self Care | Attending: Urology

## 2018-07-03 ENCOUNTER — Ambulatory Visit (HOSPITAL_BASED_OUTPATIENT_CLINIC_OR_DEPARTMENT_OTHER)
Admission: RE | Admit: 2018-07-03 | Discharge: 2018-07-03 | Disposition: A | Discharge status: Home / Self Care | Payer: Medicare Other | Attending: Urology | Admitting: Urology

## 2018-07-03 ENCOUNTER — Ambulatory Visit (HOSPITAL_BASED_OUTPATIENT_CLINIC_OR_DEPARTMENT_OTHER): Payer: Medicare Other | Admitting: Anesthesiology

## 2018-07-03 ENCOUNTER — Other Ambulatory Visit: Payer: Self-pay

## 2018-07-03 ENCOUNTER — Encounter (HOSPITAL_BASED_OUTPATIENT_CLINIC_OR_DEPARTMENT_OTHER): Payer: Self-pay

## 2018-07-03 DIAGNOSIS — I10 Essential (primary) hypertension: Secondary | ICD-10-CM | POA: Diagnosis not present

## 2018-07-03 DIAGNOSIS — R351 Nocturia: Secondary | ICD-10-CM | POA: Diagnosis not present

## 2018-07-03 DIAGNOSIS — M1990 Unspecified osteoarthritis, unspecified site: Secondary | ICD-10-CM | POA: Diagnosis not present

## 2018-07-03 DIAGNOSIS — R3912 Poor urinary stream: Secondary | ICD-10-CM | POA: Insufficient documentation

## 2018-07-03 DIAGNOSIS — N138 Other obstructive and reflux uropathy: Secondary | ICD-10-CM | POA: Insufficient documentation

## 2018-07-03 DIAGNOSIS — F329 Major depressive disorder, single episode, unspecified: Secondary | ICD-10-CM | POA: Insufficient documentation

## 2018-07-03 DIAGNOSIS — E7800 Pure hypercholesterolemia, unspecified: Secondary | ICD-10-CM | POA: Insufficient documentation

## 2018-07-03 DIAGNOSIS — Z79899 Other long term (current) drug therapy: Secondary | ICD-10-CM | POA: Diagnosis not present

## 2018-07-03 DIAGNOSIS — N401 Enlarged prostate with lower urinary tract symptoms: Secondary | ICD-10-CM | POA: Diagnosis not present

## 2018-07-03 DIAGNOSIS — N5201 Erectile dysfunction due to arterial insufficiency: Secondary | ICD-10-CM | POA: Diagnosis not present

## 2018-07-03 HISTORY — DX: Major depressive disorder, single episode, unspecified: F32.9

## 2018-07-03 HISTORY — DX: Depression, unspecified: F32.A

## 2018-07-03 HISTORY — PX: CYSTOSCOPY WITH INSERTION OF UROLIFT: SHX6678

## 2018-07-03 LAB — BASIC METABOLIC PANEL
ANION GAP: 10 (ref 5–15)
BUN: 16 mg/dL (ref 8–23)
CO2: 19 mmol/L — ABNORMAL LOW (ref 22–32)
Calcium: 9 mg/dL (ref 8.9–10.3)
Chloride: 105 mmol/L (ref 98–111)
Creatinine, Ser: 1.49 mg/dL — ABNORMAL HIGH (ref 0.61–1.24)
GFR calc Af Amer: 58 mL/min — ABNORMAL LOW (ref >60)
GFR calc non Af Amer: 50 mL/min — ABNORMAL LOW (ref >60)
Glucose, Bld: 115 mg/dL — ABNORMAL HIGH (ref 70–99)
Potassium: 3.5 mmol/L (ref 3.5–5.1)
Sodium: 134 mmol/L — ABNORMAL LOW (ref 135–145)

## 2018-07-03 SURGERY — CYSTOSCOPY WITH INSERTION OF UROLIFT
Anesthesia: General | Site: Prostate

## 2018-07-03 MED ORDER — MIDAZOLAM HCL 2 MG/2ML IJ SOLN
INTRAMUSCULAR | Status: AC
  Filled 2018-07-03: qty 2

## 2018-07-03 MED ORDER — OXYBUTYNIN CHLORIDE 5 MG PO TABS
5.0000 mg | ORAL_TABLET | Freq: Once | ORAL | Status: AC
  Administered 2018-07-03: 5 mg via ORAL
  Filled 2018-07-03: qty 1

## 2018-07-03 MED ORDER — LIDOCAINE 2% (20 MG/ML) 5 ML SYRINGE
INTRAMUSCULAR | Status: AC
  Filled 2018-07-03: qty 5

## 2018-07-03 MED ORDER — LIDOCAINE HCL (CARDIAC) PF 100 MG/5ML IV SOSY
PREFILLED_SYRINGE | INTRAVENOUS | Status: DC | PRN
  Administered 2018-07-03: 60 mg via INTRAVENOUS

## 2018-07-03 MED ORDER — FENTANYL CITRATE (PF) 100 MCG/2ML IJ SOLN
INTRAMUSCULAR | Status: DC | PRN
  Administered 2018-07-03 (×2): 25 ug via INTRAVENOUS
  Administered 2018-07-03: 50 ug via INTRAVENOUS

## 2018-07-03 MED ORDER — ONDANSETRON HCL 4 MG/2ML IJ SOLN
4.0000 mg | Freq: Four times a day (QID) | INTRAMUSCULAR | Status: DC | PRN
  Filled 2018-07-03: qty 2

## 2018-07-03 MED ORDER — ONDANSETRON HCL 4 MG/2ML IJ SOLN
INTRAMUSCULAR | Status: DC | PRN
  Administered 2018-07-03: 4 mg via INTRAVENOUS

## 2018-07-03 MED ORDER — PROPOFOL 10 MG/ML IV BOLUS
INTRAVENOUS | Status: AC
  Filled 2018-07-03: qty 20

## 2018-07-03 MED ORDER — OXYCODONE HCL 5 MG PO TABS
ORAL_TABLET | ORAL | Status: AC
  Filled 2018-07-03: qty 1

## 2018-07-03 MED ORDER — FENTANYL CITRATE (PF) 100 MCG/2ML IJ SOLN
25.0000 ug | INTRAMUSCULAR | Status: DC | PRN
  Filled 2018-07-03: qty 1

## 2018-07-03 MED ORDER — PROPOFOL 10 MG/ML IV BOLUS
INTRAVENOUS | Status: DC | PRN
  Administered 2018-07-03: 190 mg via INTRAVENOUS

## 2018-07-03 MED ORDER — MIDAZOLAM HCL 2 MG/2ML IJ SOLN
INTRAMUSCULAR | Status: DC | PRN
  Administered 2018-07-03: 2 mg via INTRAVENOUS

## 2018-07-03 MED ORDER — FENTANYL CITRATE (PF) 100 MCG/2ML IJ SOLN
INTRAMUSCULAR | Status: AC
  Filled 2018-07-03: qty 2

## 2018-07-03 MED ORDER — LACTATED RINGERS IV SOLN
INTRAVENOUS | Status: DC
  Administered 2018-07-03: 50 mL via INTRAVENOUS
  Filled 2018-07-03: qty 1000

## 2018-07-03 MED ORDER — PHENYLEPHRINE 40 MCG/ML (10ML) SYRINGE FOR IV PUSH (FOR BLOOD PRESSURE SUPPORT)
PREFILLED_SYRINGE | INTRAVENOUS | Status: AC
  Filled 2018-07-03: qty 10

## 2018-07-03 MED ORDER — OXYCODONE HCL 5 MG PO TABS
5.0000 mg | ORAL_TABLET | Freq: Once | ORAL | Status: AC | PRN
  Administered 2018-07-03: 5 mg via ORAL
  Filled 2018-07-03: qty 1

## 2018-07-03 MED ORDER — CEFAZOLIN SODIUM-DEXTROSE 2-4 GM/100ML-% IV SOLN
2.0000 g | Freq: Once | INTRAVENOUS | Status: AC
  Administered 2018-07-03: 2 g via INTRAVENOUS
  Filled 2018-07-03: qty 100

## 2018-07-03 MED ORDER — STERILE WATER FOR IRRIGATION IR SOLN
Status: DC | PRN
  Administered 2018-07-03: 1000 mL

## 2018-07-03 MED ORDER — CEFAZOLIN SODIUM-DEXTROSE 2-4 GM/100ML-% IV SOLN
INTRAVENOUS | Status: AC
  Filled 2018-07-03: qty 100

## 2018-07-03 MED ORDER — OXYCODONE HCL 5 MG/5ML PO SOLN
5.0000 mg | Freq: Once | ORAL | Status: AC | PRN
  Filled 2018-07-03: qty 5

## 2018-07-03 MED ORDER — DEXAMETHASONE SODIUM PHOSPHATE 10 MG/ML IJ SOLN
INTRAMUSCULAR | Status: DC | PRN
  Administered 2018-07-03: 4 mg via INTRAVENOUS

## 2018-07-03 MED ORDER — OXYBUTYNIN CHLORIDE 5 MG PO TABS
ORAL_TABLET | ORAL | Status: AC
  Filled 2018-07-03: qty 1

## 2018-07-03 MED ORDER — PHENYLEPHRINE 40 MCG/ML (10ML) SYRINGE FOR IV PUSH (FOR BLOOD PRESSURE SUPPORT)
PREFILLED_SYRINGE | INTRAVENOUS | Status: DC | PRN
  Administered 2018-07-03 (×3): 80 ug via INTRAVENOUS
  Administered 2018-07-03: 120 ug via INTRAVENOUS

## 2018-07-03 MED ORDER — ONDANSETRON HCL 4 MG/2ML IJ SOLN
INTRAMUSCULAR | Status: AC
  Filled 2018-07-03: qty 2

## 2018-07-03 SURGICAL SUPPLY — 25 items
BAG DRAIN URO-CYSTO SKYTR STRL (DRAIN) ×2 IMPLANT
BAG DRN ANRFLXCHMBR STRAP LEK (BAG)
BAG DRN UROCATH (DRAIN) ×1
BAG URINE DRAINAGE (UROLOGICAL SUPPLIES) IMPLANT
BAG URINE LEG 19OZ MD ST LTX (BAG) IMPLANT
BAG URINE LEG 500ML (DRAIN) ×1 IMPLANT
CATH COUDE FOLEY 2W 5CC 18FR (CATHETERS) ×1 IMPLANT
CATH FOLEY 2WAY SLVR  5CC 16FR (CATHETERS)
CATH FOLEY 2WAY SLVR  5CC 18FR (CATHETERS)
CATH FOLEY 2WAY SLVR 5CC 16FR (CATHETERS) IMPLANT
CATH FOLEY 2WAY SLVR 5CC 18FR (CATHETERS) IMPLANT
CLOTH BEACON ORANGE TIMEOUT ST (SAFETY) ×2 IMPLANT
ELECT REM PT RETURN 9FT ADLT (ELECTROSURGICAL) ×2
ELECTRODE REM PT RTRN 9FT ADLT (ELECTROSURGICAL) ×1 IMPLANT
GLOVE BIO SURGEON STRL SZ7.5 (GLOVE) ×2 IMPLANT
GOWN STRL REUS W/ TWL XL LVL3 (GOWN DISPOSABLE) ×1 IMPLANT
GOWN STRL REUS W/TWL XL LVL3 (GOWN DISPOSABLE) ×2
KIT TURNOVER CYSTO (KITS) ×2 IMPLANT
MANIFOLD NEPTUNE II (INSTRUMENTS) IMPLANT
NEEDLE HYPO 22GX1.5 SAFETY (NEEDLE) IMPLANT
NS IRRIG 500ML POUR BTL (IV SOLUTION) IMPLANT
PACK CYSTO (CUSTOM PROCEDURE TRAY) ×2 IMPLANT
SYSTEM UROLIFT (Male Continence) ×3 IMPLANT
TUBE CONNECTING 12X1/4 (SUCTIONS) IMPLANT
WATER STERILE IRR 3000ML UROMA (IV SOLUTION) ×2 IMPLANT

## 2018-07-03 NOTE — Discharge Instructions (Signed)
Post Anesthesia Home Care Instructions  Activity: Get plenty of rest for the remainder of the day. A responsible individual must stay with you for 24 hours following the procedure.  For the next 24 hours, DO NOT: -Drive a car -Advertising copywriter -Drink alcoholic beverages -Take any medication unless instructed by your physician -Make any legal decisions or sign important papers.  Meals: Start with liquid foods such as gelatin or soup. Progress to regular foods as tolerated. Avoid greasy, spicy, heavy foods. If nausea and/or vomiting occur, drink only clear liquids until the nausea and/or vomiting subsides. Call your physician if vomiting continues.  Special Instructions/Symptoms: Your throat may feel dry or sore from the anesthesia or the breathing tube placed in your throat during surgery. If this causes discomfort, gargle with warm salt water. The discomfort should disappear within 24 hours.     Indwelling Urinary Catheter Care, Adult An indwelling urinary catheter is a thin tube that is put into your bladder. The tube helps to drain pee (urine) out of your body. The tube goes in through your urethra. Your urethra is where pee comes out of your body. Your pee will come out through the catheter, then it will go into a bag (drainage bag). Take good care of your catheter so it will work well. How to wear your catheter and bag Supplies needed  Sticky tape (adhesive tape) or a leg strap.  Alcohol wipe or soap and water (if you use tape).  A clean towel (if you use tape).  Large overnight bag.  Smaller bag (leg bag). Wearing your catheter Attach your catheter to your leg with tape or a leg strap.  Make sure the catheter is not pulled tight.  If a leg strap gets wet, take it off and put on a dry strap.  If you use tape to hold the bag on your leg: 1. Use an alcohol wipe or soap and water to wash your skin where the tape made it sticky before. 2. Use a clean towel to pat-dry that  skin. 3. Use new tape to make the bag stay on your leg. Wearing your bags You should have been given a large overnight bag.  You may wear the overnight bag in the day or night.  Always have the overnight bag lower than your bladder.  Do not let the bag touch the floor.  Before you go to sleep, put a clean plastic bag in a wastebasket. Then hang the overnight bag inside the wastebasket. You should also have a smaller leg bag that fits under your clothes.  Always wear the leg bag below your knee.  Do not wear your leg bag at night. How to care for your skin and catheter Supplies needed  A clean washcloth.  Water and mild soap.  A clean towel. Caring for your skin and catheter      Clean the skin around your catheter every day: ? Wash your hands with soap and water. ? Wet a clean washcloth in warm water and mild soap. ? Clean the skin around your urethra. ? If you are male: ? Gently spread the folds of skin around your vagina (labia). ? With the washcloth in your other hand, wipe the inner side of your labia on each side. Wipe from front to back. ? If you are male: ? Pull back any skin that covers the end of your penis (foreskin). ? With the washcloth in your other hand, wipe your penis in small circles. Start wiping  at the tip of your penis, then move away from the catheter. ? With your free hand, hold the catheter close to where it goes into your body. ? Keep holding the catheter during cleaning so it does not get pulled out. ? With the washcloth in your other hand, clean the catheter. ? Only wipe downward on the catheter. ? Do not wipe upward toward your body. Doing this may push germs into your urethra and cause infection. ? Use a clean towel to pat-dry the catheter and the skin around it. Make sure to wipe off all soap. ? Wash your hands with soap and water.  Shower every day. Do not take baths.  Do not use cream, ointment, or lotion on the area where the catheter  goes into your body, unless your doctor tells you to.  Do not use powders, sprays, or lotions on your genital area.  Check your skin around the catheter every day for signs of infection. Check for: ? Redness, swelling, or pain. ? Fluid or blood. ? Warmth. ? Pus or a bad smell. How to empty the bag Supplies needed  Rubbing alcohol.  Gauze pad or cotton ball.  Tape or a leg strap. Emptying the bag Pour the pee out of your bag when it is ?- full, or at least 2-3 times a day. Do this for your overnight bag and your leg bag. 1. Wash your hands with soap and water. 2. Separate (detach) the bag from your leg. 3. Hold the bag over the toilet or a clean pail. Keep the bag lower than your hips and bladder. This is so the pee (urine) does not go back into the tube. 4. Open the pour spout. It is at the bottom of the bag. 5. Empty the pee into the toilet or pail. Do not let the pour spout touch any surface. 6. Put rubbing alcohol on a gauze pad or cotton ball. 7. Use the gauze pad or cotton ball to clean the pour spout. 8. Close the pour spout. 9. Attach the bag to your leg with tape or a leg strap. 10. Wash your hands with soap and water. Follow instructions for cleaning the drainage bag:  From the product maker.  As told by your doctor. How to change the bag Supplies needed  Alcohol wipes.  A clean bag.  Tape or a leg strap. Changing the bag Replace your bag with a clean bag once a month. If it starts to leak, smell bad, or look dirty, change it sooner. 1. Wash your hands with soap and water. 2. Separate the dirty bag from your leg. 3. Pinch the catheter with your fingers so that pee does not spill out. 4. Separate the catheter tube from the bag tube where these tubes connect (at the connection valve). Do not let the tubes touch any surface. 5. Clean the end of the catheter tube with an alcohol wipe. Use a different alcohol wipe to clean the end of the bag tube. 6. Connect the  catheter tube to the tube of the clean bag. 7. Attach the clean bag to your leg with tape or a leg strap. Do not make the bag tight on your leg. 8. Wash your hands with soap and water. General rules   Never pull on your catheter. Never try to take it out. Doing that can hurt you.  Always wash your hands before and after you touch your catheter or bag. Use a mild, fragrance-free soap. If you do not  have soap and water, use hand sanitizer.  Always make sure there are no twists or bends (kinks) in the catheter tube.  Always make sure there are no leaks in the catheter or bag.  Drink enough fluid to keep your pee pale yellow.  Do not take baths, swim, or use a hot tub.  If you are male, wipe from front to back after you poop (have a bowel movement). Contact a doctor if:  Your pee is cloudy.  Your pee smells worse than usual.  Your catheter gets clogged.  Your catheter leaks.  Your bladder feels full. Get help right away if:  You have redness, swelling, or pain where the catheter goes into your body.  You have fluid, blood, pus, or a bad smell coming from the area where the catheter goes into your body.  Your skin feels warm where the catheter goes into your body.  You have a fever.  You have pain in your: ? Belly (abdomen). ? Legs. ? Lower back. ? Bladder.  You see blood in the catheter.  Your pee is pink or red.  You feel sick to your stomach (nauseous).  You throw up (vomit).  You have chills.  Your pee is not draining into the bag.  Your catheter gets pulled out. Summary  An indwelling urinary catheter is a thin tube that is placed into the bladder to help drain pee (urine) out of the body.  The catheter is placed into the part of the body that drains pee from the bladder (urethra).  Taking good care of your catheter will keep it working properly and help prevent problems.  Always wash your hands before and after touching your catheter or  bag.  Never pull on your catheter or try to take it out. This information is not intended to replace advice given to you by your health care provider. Make sure you discuss any questions you have with your health care provider. Document Released: 08/27/2012 Document Revised: 10/23/2017 Document Reviewed: 12/16/2016 Elsevier Interactive Patient Education  2019 [employer] Prostatic Urethral Lift, Care After This sheet gives you information about how to care for yourself after your procedure. Your health care provider may also give you more specific instructions. If you have problems or questions, contact your health care provider. What can I expect after the procedure?  After the procedure, it is common to have:  Discomfort or burning when urinating.  An increased urge to urinate.  More frequent urination.  Urine that is slightly blood-tinged. These symptoms should go away after a few days. Follow these instructions at home:    Take over-the-counter and prescription medicines only as told by your health care provider.  Do not drive for 24 hours if you were given a medicine to help you relax (sedative).  Do not drive or use heavy machinery while taking prescription pain medicine.  Do not lift anything that is heavier than 10 lb (4.5 kg) until your health care provider says that this is safe.  Return to your normal activities as told by your health care provider. Ask your health care provider what activities are safe for you. Ask when you can return to sexual activity.  Drink enough fluid to keep your urine clear or pale yellow.  Keep all follow-up visits as told by your health care provider. This is important. Contact a health care provider if:  You have chills or a fever.  You have pain when passing urine.  You have bright red  blood or blood clots in your urine.  You have difficulty passing urine.  You have leaking of urine (incontinence). Get help right away  if:  You have chest pain or shortness of breath.  You have leg pain or swelling.  You cannot pass urine. Summary  After the procedure, it is common to have discomfort or burning when urinating, an increased urge to urinate, more frequent urination, and urine that is slightly blood-tinged.  Do not drive for 24 hours if you were given a medicine to help you relax (sedative). Do not drive or use heavy machinery while taking prescription pain medicine.  Do not lift anything that is heavier than 10 lb (4.5 kg) until your health care provider says that this is safe.  Return to your normal activities as told by your health care provider. This information is not intended to replace advice given to you by your health care provider. Make sure you discuss any questions you have with your health care provider. Document Released: 06/21/2016 Document Revised: 06/21/2016 Document Reviewed: 06/21/2016 Elsevier Interactive Patient Education  2019 [employer].

## 2018-07-03 NOTE — Interval H&P Note (Signed)
History and Physical Interval Note:  07/03/2018 8:44 AM  Nicholas Cooke  has presented today for surgery, with the diagnosis of BENIGN PROSTATIC HYPERPLASIA, WEAK URINARY STREAM  The various methods of treatment have been discussed with the patient and family. After consideration of risks, benefits and other options for treatment, the patient has consented to  Procedure(s) with comments: CYSTOSCOPY WITH INSERTION OF UROLIFT (N/A) - ONLY NEEDS 30 MIN FOR PROCEDURE as a surgical intervention.  I discussed with the patient and his wife the nature, potential benefits, risks and alternatives to Urolift, including side effects of the proposed treatment, the likelihood of the patient achieving the goals of the procedure, and any potential problems that might occur during the procedure or recuperation. We discussed flow symptoms and freq/urge symptoms typically improve but can stay the same or worsen. The patient's history has been reviewed, patient examined, no change in status, stable for surgery.  I have reviewed the patient's chart and labs.  Questions were answered to the patient's satisfaction.     Jerilee Field

## 2018-07-03 NOTE — Anesthesia Preprocedure Evaluation (Signed)
Anesthesia Evaluation  Patient identified by MRN, date of birth, ID band Patient awake    Reviewed: Allergy & Precautions, H&P , NPO status , Patient's Chart, lab work & pertinent test results  Airway Mallampati: II   Neck ROM: full    Dental   Pulmonary neg pulmonary ROS,    breath sounds clear to auscultation       Cardiovascular hypertension,  Rhythm:regular Rate:Normal     Neuro/Psych PSYCHIATRIC DISORDERS Depression    GI/Hepatic   Endo/Other    Renal/GU      Musculoskeletal  (+) Arthritis , Osteoarthritis,    Abdominal   Peds  Hematology   Anesthesia Other Findings   Reproductive/Obstetrics                             Anesthesia Physical Anesthesia Plan  ASA: II  Anesthesia Plan: General   Post-op Pain Management:    Induction: Intravenous  PONV Risk Score and Plan: 2 and Ondansetron, Dexamethasone, Midazolam and Treatment may vary due to age or medical condition  Airway Management Planned: LMA  Additional Equipment:   Intra-op Plan:   Post-operative Plan:   Informed Consent: I have reviewed the patients History and Physical, chart, labs and discussed the procedure including the risks, benefits and alternatives for the proposed anesthesia with the patient or authorized representative who has indicated his/her understanding and acceptance.       Plan Discussed with: CRNA, Anesthesiologist and Surgeon  Anesthesia Plan Comments:         Anesthesia Quick Evaluation

## 2018-07-03 NOTE — Transfer of Care (Signed)
Immediate Anesthesia Transfer of Care Note  Patient: Nicholas Cooke  Procedure(s) Performed: CYSTOSCOPY WITH INSERTION OF UROLIFT (N/A Prostate)  Patient Location: PACU  Anesthesia Type:General  Level of Consciousness: awake, alert , oriented, drowsy and patient cooperative  Airway & Oxygen Therapy: Patient Spontanous Breathing and Patient connected to nasal cannula oxygen  Post-op Assessment: Report given to RN and Post -op Vital signs reviewed and stable  Post vital signs: Reviewed and stable  Last Vitals:  Vitals Value Taken Time  BP 142/87 07/03/2018  9:45 AM  Temp    Pulse 76 07/03/2018  9:46 AM  Resp    SpO2 100 % 07/03/2018  9:46 AM  Vitals shown include unvalidated device data.  Last Pain:  Vitals:   07/03/18 0747  TempSrc:   PainSc: 0-No pain      Patients Stated Pain Goal: 5 (07/03/18 0747)  Complications: No apparent anesthesia complications

## 2018-07-03 NOTE — Anesthesia Procedure Notes (Signed)
Procedure Name: LMA Insertion Date/Time: 07/03/2018 9:08 AM Performed by: Yolonda Kida, CRNA Pre-anesthesia Checklist: Patient identified, Emergency Drugs available, Suction available and Patient being monitored Patient Re-evaluated:Patient Re-evaluated prior to induction Oxygen Delivery Method: Circle system utilized Preoxygenation: Pre-oxygenation with 100% oxygen Induction Type: IV induction LMA: LMA inserted LMA Size: 4.0 Number of attempts: 1 Placement Confirmation: positive ETCO2,  CO2 detector and breath sounds checked- equal and bilateral Tube secured with: Tape Dental Injury: Teeth and Oropharynx as per pre-operative assessment

## 2018-07-03 NOTE — Op Note (Signed)
Preoperative diagnosis: BPH with obstructive symptomatology. Postoperative diagnosis: Same  Principal procedure: Urolift procedure, with the placement of 3 implants.  Surgeon: Mena Goes  Anesthesia: Gen  Complications: None  Drains: 18 French Foley catheter, to leg bag.  Estimated blood loss: Less than 25 mL  Indications: 68 -year-old male with obstructive symptomatology secondary to BPH. The patient's symptoms have progressed, and he has requested further management. Management options including TURP with resection/ablation of the prostate as well as Urolift were discussed. The patient has chosen to have a Urolift procedure. He has been instructed to the procedure as well as risks and complications which include but are not limited to infection, bleeding, and inadequate treatment with the Urolift procedure alone, anesthetic complications, among others. He understands these and desires to proceed.  Findings: Using the 17 French cystoscope, urethra and bladder were inspected. There were no urethral lesions. Prostatic urethra was obstructed secondary to bilobar hypertrophy with a short prostate and high bladder neck. The bladder was inspected circumferentially. This revealed normal findings.  On exam under anesthesia the penis was uncircumcised without phimosis.  Foreskin was normal.  The testicles were descended bilaterally and palpably normal.  On digital rectal exam the prostate was about 30 g and smooth without hard area or nodule.  Description of procedure: The patient was properly identified in the holding area. He received preoperative antibiotics. He was taken to the procedure room where monitored anesthesia care was administered. He was placed in the dorsolithotomy position. Genitalia and perineum were prepped and draped. Proper timeout was performed. A 43F cystoscope was inserted into the bladder. The cystoscopy bridge was replaced with a UroLift delivery device. The first treatment site  was the patient's left side  just proximal to the verumontanum (#1) given the short prostate and high bladder neck. The distal tip of the delivery device was then angled laterally approximately 20 degrees at this position to compress the lateral lobe. The trigger was pulled, thereby deploying a needle containing the implant through the prostate. The needle was then retracted, allowing one end of the implant to be delivered to the capsular surface of the prostate. The implant was then tensioned to assure capsular seating and removal of slack monofilament. The device was then angled back toward midline and slowly advanced proximally until cystoscopic verification of the monofilament being centered in the delivery bay. The urethral end piece was then affixed to the monofilament thereby tailoring the size of the implant. Excess filament was then severed. The delivery device was then re-advanced into the bladder. The delivery device was then replaced with cystoscope and bridge and the implant location and opening effect was confirmed cystoscopically. The same procedure was then repeated on the right side (#2) anterior and just proximal to the verumontanum.  On inspection the left implant traveled a little bit closer to the bladder neck but was still in good position in the prostatic urethra.  This allowed some apical tissue on the left to bulged out and obstruct.  Therefore I placed 1 additional implants (#3) just proximal to the verumontanum on the left anteriorly in line with the first one following the same technique. A final cystoscopy was conducted first to inspect the location and state of each implant and second, to confirm the presence of a continuous anterior channel was present through the prostatic urethra with irrigation flow turned off. Three implants were delivered in total. Following this, the scope was removed and an 54 French Foley catheter was placed and hooked to dependent drainage.  An EUA was  performed. He was then awakened and taken to the recovery area in stable condition. He tolerated the procedure well.

## 2018-07-04 ENCOUNTER — Encounter (HOSPITAL_BASED_OUTPATIENT_CLINIC_OR_DEPARTMENT_OTHER): Payer: Self-pay | Admitting: Urology

## 2018-07-05 NOTE — Anesthesia Postprocedure Evaluation (Signed)
Anesthesia Post Note  Patient: Nicholas Cooke  Procedure(s) Performed: CYSTOSCOPY WITH INSERTION OF UROLIFT (N/A Prostate)     Patient location during evaluation: PACU Anesthesia Type: General Level of consciousness: awake and alert Pain management: pain level controlled Vital Signs Assessment: post-procedure vital signs reviewed and stable Respiratory status: spontaneous breathing, nonlabored ventilation, respiratory function stable and patient connected to nasal cannula oxygen Cardiovascular status: blood pressure returned to baseline and stable Postop Assessment: no apparent nausea or vomiting Anesthetic complications: no    Last Vitals:  Vitals:   07/03/18 1015 07/03/18 1150  BP: (!) 149/92 (!) 145/95  Pulse: 74 74  Resp: 13 13  Temp:  36.7 C  SpO2: 96% 99%    Last Pain:  Vitals:   07/03/18 1130  TempSrc:   PainSc: 4                  [name]

## 2018-12-21 ENCOUNTER — Other Ambulatory Visit: Payer: Self-pay | Admitting: Otolaryngology

## 2018-12-21 DIAGNOSIS — J329 Chronic sinusitis, unspecified: Secondary | ICD-10-CM

## 2019-01-01 ENCOUNTER — Ambulatory Visit
Admission: RE | Admit: 2019-01-01 | Discharge: 2019-01-01 | Disposition: A | Discharge status: Home / Self Care | Payer: Self-pay | Source: Ambulatory Visit | Attending: Otolaryngology | Admitting: Otolaryngology

## 2019-01-01 ENCOUNTER — Other Ambulatory Visit: Payer: Self-pay

## 2019-01-01 DIAGNOSIS — J329 Chronic sinusitis, unspecified: Secondary | ICD-10-CM

## 2019-11-01 ENCOUNTER — Encounter (HOSPITAL_COMMUNITY): Payer: Self-pay | Admitting: Emergency Medicine

## 2019-11-01 ENCOUNTER — Ambulatory Visit (INDEPENDENT_AMBULATORY_CARE_PROVIDER_SITE_OTHER): Payer: Medicare Other

## 2019-11-01 ENCOUNTER — Other Ambulatory Visit: Payer: Self-pay

## 2019-11-01 ENCOUNTER — Ambulatory Visit (HOSPITAL_COMMUNITY)
Admission: EM | Admit: 2019-11-01 | Discharge: 2019-11-01 | Disposition: A | Discharge status: Home / Self Care | Payer: Medicare Other | Attending: Family Medicine | Admitting: Family Medicine

## 2019-11-01 DIAGNOSIS — M545 Low back pain, unspecified: Secondary | ICD-10-CM

## 2019-11-01 MED ORDER — PREDNISONE 10 MG (21) PO TBPK
ORAL_TABLET | ORAL | 0 refills | Status: DC
Start: 2019-11-01 — End: 2021-08-09

## 2019-11-01 MED ORDER — TIZANIDINE HCL 4 MG PO CAPS
4.0000 mg | ORAL_CAPSULE | Freq: Three times a day (TID) | ORAL | 0 refills | Status: DC
Start: 2019-11-01 — End: 2021-08-09

## 2019-11-01 NOTE — ED Triage Notes (Signed)
PT loaded and unloaded a pushmower into the back of his truck Monday. He had immediate back pain after lifting it into truck.

## 2019-11-01 NOTE — Discharge Instructions (Signed)
You x ray showed some degenerative changes We will treat this back pain with prednisone taper over the next 6 days.  Low-dose muscle relaxant to help relax the muscles in your back. Recommend rest, heat to the back Follow up as needed for continued or worsening symptoms

## 2019-11-03 NOTE — ED Provider Notes (Signed)
[name]    CSN: [id number] Arrival date & time: 11/01/19  2778      History   Chief Complaint Chief Complaint  Patient presents with  . Back Pain    HPI Nicholas Cooke is a 69 y.o. male.   Patient is a 70 year old male past medical history of depression, enlarged prostate, hypertension, osteoarthritis of knee.  He presents today with lower back discomfort that started Monday.  Started after taking up a lawnmower to put in the back of a truck.  Had immediate pain lifting this into the truck.  The pain is stayed constant in his lower back.  Denies any radiation of pain, numbness or tingling.  Has been doing heat to the back and taking over-the-counter medications as needed.  Concerned due to continued pain.  No urinary associated problems, saddle paresthesias, loss of bowel or bladder function. ROS per HPI      Past Medical History:  Diagnosis Date  . Dental crowns present   . Depression   . Enlarged prostate   . Hypertension    states under control with med., has been on med. x 2 yr.  . Inguinal hernia 12/2015  . Osteoarthritis     Patient Active Problem List   Diagnosis Date Noted  . Osteoarthritis of right knee 05/14/2015  . Right knee DJD 05/14/2015  . Hemiplegia, unspecified, affecting nondominant side 06/13/2013  . Left inguinal hernia 01/24/2012    Past Surgical History:  Procedure Laterality Date  . APPENDECTOMY  yrs ago  . CYSTOSCOPY WITH INSERTION OF UROLIFT N/A 07/03/2018   Procedure: CYSTOSCOPY WITH INSERTION OF UROLIFT;  Surgeon: Festus Aloe, MD;  Location: [facility];  Service: Urology;  Laterality: N/A;  ONLY NEEDS 30 MIN FOR PROCEDURE  . INGUINAL HERNIA REPAIR  02/16/2012   Procedure: HERNIA REPAIR INGUINAL ADULT;  Surgeon: Harl Bowie, MD;  Location: [facility];  Service: General;  Laterality: Left;  left inguinal hernia repair with mesh  . INGUINAL HERNIA REPAIR Bilateral 12/23/2015   Procedure:  LAPAROSCOPIC LEFT INGUINAL HERNIA WITH MESH;  Surgeon: Coralie Keens, MD;  Location: [facility];  Service: General;  Laterality: Bilateral;  . INSERTION OF MESH Bilateral 12/23/2015   Procedure: INSERTION OF MESH;  Surgeon: Coralie Keens, MD;  Location: [place] [facility];  Service: General;  Laterality: Bilateral;  . KNEE ARTHROSCOPY WITH MEDIAL MENISECTOMY Right 10/18/2013   Procedure: RIGHT KNEE ARTHROSCOPY WITH PARTIAL MEDIAL MENISECTOMY;  Surgeon: Johnn Hai, MD;  Location: [facility];  Service: Orthopedics;  Laterality: Right;  . SHOULDER ARTHROSCOPY WITH ROTATOR CUFF REPAIR Left 2001  . TOTAL KNEE ARTHROPLASTY Right 05/14/2015   Procedure: REMOVAL OF HARDWARE AND RIGHT TOTAL KNEE ARTHROPLASTY;  Surgeon: Susa Day, MD;  Location: [facility];  Service: Orthopedics;  Laterality: Right;  . ULNAR NERVE REPAIR Right 1996       Home Medications    Prior to Admission medications   Medication Sig Start Date End Date Taking? Authorizing Provider  lisinopril-hydrochlorothiazide (PRINZIDE,ZESTORETIC) 10-12.5 MG tablet Take 1 tablet by mouth daily.   Yes [provider]  Omega-3 Fatty Acids (FISH OIL PO) Take by mouth.   Yes [provider]  tadalafil (CIALIS) 5 MG tablet Take 5 mg by mouth daily as needed for erectile dysfunction.   Yes [provider]  imipramine (TOFRANIL) 25 MG tablet Take 25 mg by mouth at bedtime. 03/07/15   [provider]  predniSONE (STERAPRED UNI-PAK 21 TAB) 10  MG (21) TBPK tablet 6 tabs for 1 day, then 5 tabs for 1 das, then 4 tabs for 1 day, then 3 tabs for 1 day, 2 tabs for 1 day, then 1 tab for 1 day 11/01/19   Dahlia Byes A, NP  tamsulosin (FLOMAX) 0.4 MG CAPS capsule Take 0.4 mg by mouth at bedtime.    [provider]  tiZANidine (ZANAFLEX) 4 MG capsule Take 1 capsule (4 mg total) by mouth 3 (three) times daily. 11/01/19   Janace Aris, NP    Family History No family history on file.  Social  History Social History   Tobacco Use  . Smoking status: Never Smoker  . Smokeless tobacco: Never Used  Vaping Use  . Vaping Use: Never used  Substance Use Topics  . Alcohol use: No  . Drug use: No     Allergies   Patient has no known allergies.   Review of Systems Review of Systems   Physical Exam Triage Vital Signs ED Triage Vitals  Enc Vitals Group     BP 11/01/19 0937 119/70     Pulse Rate 11/01/19 0937 74     Resp 11/01/19 0937 16     Temp 11/01/19 0937 98.4 F (36.9 C)     Temp Source 11/01/19 0937 Oral     SpO2 11/01/19 0937 96 %     Weight --      Height --      Head Circumference --      Peak Flow --      Pain Score 11/01/19 0934 8     Pain Loc --      Pain Edu? --      Excl. in GC? --    No data found.  Updated Vital Signs BP 119/70   Pulse 74   Temp 98.4 F (36.9 C) (Oral)   Resp 16   SpO2 96%   Visual Acuity Right Eye Distance:   Left Eye Distance:   Bilateral Distance:    Right Eye Near:   Left Eye Near:    Bilateral Near:     Physical Exam Vitals and nursing note reviewed.  Constitutional:      Appearance: Normal appearance.  HENT:     Head: Normocephalic and atraumatic.     Nose: Nose normal.  Eyes:     Conjunctiva/sclera: Conjunctivae normal.  Pulmonary:     Effort: Pulmonary effort is normal.  Abdominal:     Palpations: Abdomen is soft.     Tenderness: There is no abdominal tenderness.  Musculoskeletal:     Cervical back: Normal range of motion.     Lumbar back: Spasms, tenderness and bony tenderness present. No swelling or deformity. Decreased range of motion.       Back:  Skin:    General: Skin is warm and dry.  Neurological:     Mental Status: He is alert.  Psychiatric:        Mood and Affect: Mood normal.      UC Treatments / Results  Labs (all labs ordered are listed, but only abnormal results are displayed) Labs Reviewed - No data to display  EKG   Radiology No results  found.  Procedures Procedures (including critical care time)  Medications Ordered in UC Medications - No data to display  Initial Impression / Assessment and Plan / UC Course  I have reviewed the triage vital signs and the nursing notes.  Pertinent labs & imaging results that were available  during my care of the patient were reviewed by me and considered in my medical decision making (see chart for details).     Acute lower back pain without sciatica Most likely muscle strain with some spasm. X-ray did show some degenerative changes in the lumbar spine. Treating with prednisone taper over the next 6 days and Zanaflex as needed for muscle relaxant. Recommended heat to the area, rest Follow up as needed for continued or worsening symptoms  Final Clinical Impressions(s) / UC Diagnoses   Final diagnoses:  Acute midline low back pain without sciatica     Discharge Instructions     You x ray showed some degenerative changes We will treat this back pain with prednisone taper over the next 6 days.  Low-dose muscle relaxant to help relax the muscles in your back. Recommend rest, heat to the back Follow up as needed for continued or worsening symptoms      ED Prescriptions    Medication Sig Dispense Auth. Provider   predniSONE (STERAPRED UNI-PAK 21 TAB) 10 MG (21) TBPK tablet 6 tabs for 1 day, then 5 tabs for 1 das, then 4 tabs for 1 day, then 3 tabs for 1 day, 2 tabs for 1 day, then 1 tab for 1 day 21 tablet Ferry Matthis A, NP   tiZANidine (ZANAFLEX) 4 MG capsule Take 1 capsule (4 mg total) by mouth 3 (three) times daily. 20 capsule Dahlia Byes A, NP     PDMP not reviewed this encounter.   Janace Aris, NP 11/03/19 1807

## 2020-01-31 ENCOUNTER — Ambulatory Visit (HOSPITAL_COMMUNITY)
Admission: EM | Admit: 2020-01-31 | Discharge: 2020-01-31 | Disposition: A | Discharge status: Home / Self Care | Payer: Medicare Other

## 2020-01-31 ENCOUNTER — Other Ambulatory Visit: Payer: Self-pay

## 2020-01-31 ENCOUNTER — Encounter (HOSPITAL_COMMUNITY): Payer: Self-pay | Admitting: *Deleted

## 2020-01-31 ENCOUNTER — Emergency Department (HOSPITAL_BASED_OUTPATIENT_CLINIC_OR_DEPARTMENT_OTHER): Payer: Medicare Other

## 2020-01-31 ENCOUNTER — Encounter (HOSPITAL_BASED_OUTPATIENT_CLINIC_OR_DEPARTMENT_OTHER): Payer: Self-pay | Admitting: *Deleted

## 2020-01-31 ENCOUNTER — Emergency Department (HOSPITAL_BASED_OUTPATIENT_CLINIC_OR_DEPARTMENT_OTHER)
Admission: EM | Admit: 2020-01-31 | Discharge: 2020-01-31 | Disposition: A | Discharge status: Home / Self Care | Payer: Medicare Other | Attending: Emergency Medicine | Admitting: Emergency Medicine

## 2020-01-31 DIAGNOSIS — K5792 Diverticulitis of intestine, part unspecified, without perforation or abscess without bleeding: Secondary | ICD-10-CM

## 2020-01-31 DIAGNOSIS — R1032 Left lower quadrant pain: Secondary | ICD-10-CM | POA: Diagnosis not present

## 2020-01-31 DIAGNOSIS — I10 Essential (primary) hypertension: Secondary | ICD-10-CM | POA: Diagnosis not present

## 2020-01-31 DIAGNOSIS — Z79899 Other long term (current) drug therapy: Secondary | ICD-10-CM | POA: Diagnosis not present

## 2020-01-31 DIAGNOSIS — Z8719 Personal history of other diseases of the digestive system: Secondary | ICD-10-CM | POA: Insufficient documentation

## 2020-01-31 DIAGNOSIS — Z96651 Presence of right artificial knee joint: Secondary | ICD-10-CM | POA: Diagnosis not present

## 2020-01-31 DIAGNOSIS — R109 Unspecified abdominal pain: Secondary | ICD-10-CM | POA: Diagnosis present

## 2020-01-31 LAB — CBC WITH DIFFERENTIAL/PLATELET
Abs Immature Granulocytes: 0.03 10*3/uL (ref 0.00–0.07)
Basophils Absolute: 0.1 10*3/uL (ref 0.0–0.1)
Basophils Relative: 0 %
Eosinophils Absolute: 0 10*3/uL (ref 0.0–0.5)
Eosinophils Relative: 0 %
HCT: 50.6 % (ref 39.0–52.0)
Hemoglobin: 16.9 g/dL (ref 13.0–17.0)
Immature Granulocytes: 0 %
Lymphocytes Relative: 30 %
Lymphs Abs: 3.9 10*3/uL (ref 0.7–4.0)
MCH: 29 pg (ref 26.0–34.0)
MCHC: 33.4 g/dL (ref 30.0–36.0)
MCV: 86.8 fL (ref 80.0–100.0)
Monocytes Absolute: 1.1 10*3/uL — ABNORMAL HIGH (ref 0.1–1.0)
Monocytes Relative: 9 %
Neutro Abs: 7.9 10*3/uL — ABNORMAL HIGH (ref 1.7–7.7)
Neutrophils Relative %: 61 %
Platelets: 224 10*3/uL (ref 150–400)
RBC: 5.83 MIL/uL — ABNORMAL HIGH (ref 4.22–5.81)
RDW: 13.8 % (ref 11.5–15.5)
WBC: 13 10*3/uL — ABNORMAL HIGH (ref 4.0–10.5)
nRBC: 0 % (ref 0.0–0.2)

## 2020-01-31 LAB — URINALYSIS, ROUTINE W REFLEX MICROSCOPIC
Bilirubin Urine: NEGATIVE
Glucose, UA: NEGATIVE mg/dL
Ketones, ur: 15 mg/dL — AB
Leukocytes,Ua: NEGATIVE
Nitrite: NEGATIVE
Protein, ur: NEGATIVE mg/dL
Specific Gravity, Urine: >1.03 — ABNORMAL HIGH (ref 1.005–1.030)
pH: 5 (ref 5.0–8.0)

## 2020-01-31 LAB — URINALYSIS, MICROSCOPIC (REFLEX)

## 2020-01-31 LAB — COMPREHENSIVE METABOLIC PANEL
ALT: 27 U/L (ref 0–44)
AST: 28 U/L (ref 15–41)
Albumin: 4.4 g/dL (ref 3.5–5.0)
Alkaline Phosphatase: 73 U/L (ref 38–126)
Anion gap: 16 — ABNORMAL HIGH (ref 5–15)
BUN: 19 mg/dL (ref 8–23)
CO2: 22 mmol/L (ref 22–32)
Calcium: 9.6 mg/dL (ref 8.9–10.3)
Chloride: 98 mmol/L (ref 98–111)
Creatinine, Ser: 1.55 mg/dL — ABNORMAL HIGH (ref 0.61–1.24)
GFR calc Af Amer: 53 mL/min — ABNORMAL LOW (ref >60)
GFR calc non Af Amer: 46 mL/min — ABNORMAL LOW (ref >60)
Glucose, Bld: 100 mg/dL — ABNORMAL HIGH (ref 70–99)
Potassium: 3.8 mmol/L (ref 3.5–5.1)
Sodium: 136 mmol/L (ref 135–145)
Total Bilirubin: 1.2 mg/dL (ref 0.3–1.2)
Total Protein: 9.3 g/dL — ABNORMAL HIGH (ref 6.5–8.1)

## 2020-01-31 LAB — LIPASE, BLOOD: Lipase: 29 U/L (ref 11–51)

## 2020-01-31 MED ORDER — ONDANSETRON HCL 4 MG/2ML IJ SOLN
4.0000 mg | Freq: Once | INTRAMUSCULAR | Status: AC
  Administered 2020-01-31: 4 mg via INTRAVENOUS
  Filled 2020-01-31: qty 2

## 2020-01-31 MED ORDER — MORPHINE SULFATE (PF) 4 MG/ML IV SOLN
4.0000 mg | Freq: Once | INTRAVENOUS | Status: AC
  Administered 2020-01-31: 4 mg via INTRAVENOUS
  Filled 2020-01-31: qty 1

## 2020-01-31 MED ORDER — OXYCODONE-ACETAMINOPHEN 5-325 MG PO TABS
1.0000 | ORAL_TABLET | Freq: Once | ORAL | Status: AC
  Administered 2020-01-31: 1 via ORAL
  Filled 2020-01-31: qty 1

## 2020-01-31 MED ORDER — FENTANYL CITRATE (PF) 100 MCG/2ML IJ SOLN
50.0000 ug | Freq: Once | INTRAMUSCULAR | Status: AC
  Administered 2020-01-31: 50 ug via INTRAVENOUS
  Filled 2020-01-31: qty 2

## 2020-01-31 MED ORDER — OXYCODONE-ACETAMINOPHEN 5-325 MG PO TABS: 1.0000 | ORAL_TABLET | Freq: Four times a day (QID) | ORAL | 0 refills | Status: DC | PRN

## 2020-01-31 MED ORDER — ONDANSETRON 4 MG PO TBDP
4.0000 mg | ORAL_TABLET | Freq: Three times a day (TID) | ORAL | 0 refills | Status: DC | PRN
Start: 2020-01-31 — End: 2021-08-09

## 2020-01-31 MED ORDER — AMOXICILLIN-POT CLAVULANATE 875-125 MG PO TABS
1.0000 | ORAL_TABLET | Freq: Once | ORAL | Status: AC
  Administered 2020-01-31: 1 via ORAL
  Filled 2020-01-31: qty 1

## 2020-01-31 MED ORDER — AMOXICILLIN-POT CLAVULANATE 875-125 MG PO TABS
1.0000 | ORAL_TABLET | Freq: Two times a day (BID) | ORAL | 0 refills | Status: DC
Start: 2020-01-31 — End: 2021-08-09

## 2020-01-31 MED ORDER — SODIUM CHLORIDE 0.9 % IV BOLUS
1000.0000 mL | Freq: Once | INTRAVENOUS | Status: AC
  Administered 2020-01-31: 1000 mL via INTRAVENOUS

## 2020-01-31 MED ORDER — IOHEXOL 300 MG/ML  SOLN
100.0000 mL | Freq: Once | INTRAMUSCULAR | Status: AC | PRN
  Administered 2020-01-31: 100 mL via INTRAVENOUS

## 2020-01-31 NOTE — ED Notes (Signed)
  Patient tolerated PO fluids well.  No nausea or vomiting.

## 2020-01-31 NOTE — Discharge Instructions (Addendum)
Your symptoms are most likely due to diverticulitis. However, your symptoms appear to be worsening and there is a concern that you have an abscess developing.   I am recommending you go to the ER for a CT scan and further evaluation and possible admission.   The choices are [facility] across the street or possibly [facility] which is a stand alone ER.

## 2020-01-31 NOTE — Discharge Instructions (Signed)
As we discussed, your work-up today was reassuring.  Your CT scan showed uncomplicated diverticulitis no evidence of abscess.  We will plan to change her antibiotics.  Stop taking Cipro and Flagyl and start taking Augmentin.  I have provided nausea medication for nausea/vomiting.  You can take Tylenol or Ibuprofen as directed for pain. You can alternate Tylenol and Ibuprofen every 4 hours. If you take Tylenol at 1pm, then you can take Ibuprofen at 5pm. Then you can take Tylenol again at 9pm.   Take pain medications as directed for break through pain. Do not drive or operate machinery while taking this medication.   As we discussed, if you do not have any improvement in the next 3-4 days, you need to follow-up with your doctor or return emergency department.  If you start having worsening fever, worsening pain, persistent vomiting that is uncontrolled by medication or any other worsening concerning symptoms in the meantime, return to the emergency department for further evaluation.

## 2020-01-31 NOTE — ED Provider Notes (Signed)
[facility] EMERGENCY DEPARTMENT Provider Note   CSN: [id number] Arrival date & time: 01/31/20  1500     History Chief Complaint  Patient presents with  . Abdominal Pain    Haniel Heisler is a 69 y.o. male past medical history x4 days associated with diarrhea, vomiting.  He states he started noticing some abdominal pain about 4 days ago.  He states he has a history of diverticulitis and states that symptoms felt similar to previous episodes of diverticulitis.  He called his doctor and was started on a course of Cipro and Flagyl which she has been on for the last 4 days.  He states that he has been taking the antibiotics but despite that, has continued to have pain in his left lower quadrant.  He also reports he has had diarrhea.  He does not know if there has been any blood in it but he has not noticed any dark stools.  He also reports that yesterday, he started having episodes of vomiting.  No blood noted in emesis.  He states he has not had much of an appetite.  He states he has had a low-grade fever.  He states eyes never got was 100.7.  He has been taking Tylenol.  Patient went to urgent care for evaluation and was sent to the ED for further evaluation given his tenderness.  He has had a history of appendectomy.  He has also had additional hernia surgeries.  No other previous surgeries.  He denies any chest pain, difficulty breathing, fevers, cough, dysuria, hematuria.  He has gotten vaccinated for Covid.  The history is provided by the patient.       Past Medical History:  Diagnosis Date  . Dental crowns present   . Depression   . Enlarged prostate   . Hypertension    states under control with med., has been on med. x 2 yr.  . Inguinal hernia 12/2015  . Osteoarthritis     Patient Active Problem List   Diagnosis Date Noted  . Osteoarthritis of right knee 05/14/2015  . Right knee DJD 05/14/2015  . Hemiplegia, unspecified, affecting nondominant side 06/13/2013  . Left  inguinal hernia 01/24/2012    Past Surgical History:  Procedure Laterality Date  . APPENDECTOMY  yrs ago  . CYSTOSCOPY WITH INSERTION OF UROLIFT N/A 07/03/2018   Procedure: CYSTOSCOPY WITH INSERTION OF UROLIFT;  Surgeon: Jerilee Field, MD;  Location: [facility];  Service: Urology;  Laterality: N/A;  ONLY NEEDS 30 MIN FOR PROCEDURE  . INGUINAL HERNIA REPAIR  02/16/2012   Procedure: HERNIA REPAIR INGUINAL ADULT;  Surgeon: Shelly Rubenstein, MD;  Location: [facility];  Service: General;  Laterality: Left;  left inguinal hernia repair with mesh  . INGUINAL HERNIA REPAIR Bilateral 12/23/2015   Procedure: LAPAROSCOPIC LEFT INGUINAL HERNIA WITH MESH;  Surgeon: Abigail Miyamoto, MD;  Location: [facility];  Service: General;  Laterality: Bilateral;  . INSERTION OF MESH Bilateral 12/23/2015   Procedure: INSERTION OF MESH;  Surgeon: Abigail Miyamoto, MD;  Location:  SURGERY CENTER;  Service: General;  Laterality: Bilateral;  . KNEE ARTHROSCOPY WITH MEDIAL MENISECTOMY Right 10/18/2013   Procedure: RIGHT KNEE ARTHROSCOPY WITH PARTIAL MEDIAL MENISECTOMY;  Surgeon: Javier Docker, MD;  Location: [facility];  Service: Orthopedics;  Laterality: Right;  . SHOULDER ARTHROSCOPY WITH ROTATOR CUFF REPAIR Left 2001  . TOTAL KNEE ARTHROPLASTY Right 05/14/2015   Procedure: REMOVAL OF HARDWARE AND RIGHT TOTAL KNEE ARTHROPLASTY;  Surgeon:  Jene Every, MD;  Location: [facility];  Service: Orthopedics;  Laterality: Right;  . ULNAR NERVE REPAIR Right 1996       No family history on file.  Social History   Tobacco Use  . Smoking status: Never Smoker  . Smokeless tobacco: Never Used  Vaping Use  . Vaping Use: Never used  Substance Use Topics  . Alcohol use: No  . Drug use: No    Home Medications Prior to Admission medications   Medication Sig Start Date End Date Taking? Authorizing Provider  amoxicillin-clavulanate (AUGMENTIN) 875-125 MG tablet Take 1 tablet  by mouth every 12 (twelve) hours. 01/31/20   Maxwell Caul, PA-C  imipramine (TOFRANIL) 25 MG tablet Take 25 mg by mouth at bedtime. 03/07/15   [provider]  lisinopril-hydrochlorothiazide (PRINZIDE,ZESTORETIC) 10-12.5 MG tablet Take 1 tablet by mouth daily.    [provider]  Omega-3 Fatty Acids (FISH OIL PO) Take by mouth.    [provider]  ondansetron (ZOFRAN ODT) 4 MG disintegrating tablet Take 1 tablet (4 mg total) by mouth every 8 (eight) hours as needed for nausea or vomiting. 01/31/20   Maxwell Caul, PA-C  oxyCODONE-acetaminophen (PERCOCET/ROXICET) 5-325 MG tablet Take 1-2 tablets by mouth every 6 (six) hours as needed for severe pain. 01/31/20   Maxwell Caul, PA-C  predniSONE (STERAPRED UNI-PAK 21 TAB) 10 MG (21) TBPK tablet 6 tabs for 1 day, then 5 tabs for 1 das, then 4 tabs for 1 day, then 3 tabs for 1 day, 2 tabs for 1 day, then 1 tab for 1 day 11/01/19   Dahlia Byes A, NP  tadalafil (CIALIS) 5 MG tablet Take 5 mg by mouth daily as needed for erectile dysfunction.    [provider]  tamsulosin (FLOMAX) 0.4 MG CAPS capsule Take 0.4 mg by mouth at bedtime.    [provider]  tiZANidine (ZANAFLEX) 4 MG capsule Take 1 capsule (4 mg total) by mouth 3 (three) times daily. 11/01/19   Janace Aris, NP  traZODone (DESYREL) 50 MG tablet 50 mg. 01/19/20   [provider]    Allergies    Patient has no known allergies.  Review of Systems   Review of Systems  Constitutional: Negative for fever.  Respiratory: Negative for cough and shortness of breath.   Cardiovascular: Negative for chest pain.  Gastrointestinal: Positive for abdominal pain, diarrhea, nausea and vomiting.  Genitourinary: Negative for dysuria and hematuria.  Neurological: Negative for headaches.  All other systems reviewed and are negative.   Physical Exam Updated Vital Signs BP 123/80   Pulse 85   Temp 98.6 F (37 C) (Oral)   Resp 17   Ht 5\' 9"   (1.753 m)   Wt 85.6 kg   SpO2 100%   BMI 27.87 kg/m   Physical Exam Vitals and nursing note reviewed.  Constitutional:      Appearance: Normal appearance. He is well-developed.  HENT:     Head: Normocephalic and atraumatic.  Eyes:     General: Lids are normal.     Conjunctiva/sclera: Conjunctivae normal.     Pupils: Pupils are equal, round, and reactive to light.  Cardiovascular:     Rate and Rhythm: Normal rate and regular rhythm.     Pulses: Normal pulses.     Heart sounds: Normal heart sounds. No murmur heard.  No friction rub. No gallop.   Pulmonary:     Effort: Pulmonary effort is normal.     Breath  sounds: Normal breath sounds.     Comments: Lungs clear to auscultation bilaterally.  Symmetric chest rise.  No wheezing, rales, rhonchi. Abdominal:     Palpations: Abdomen is soft. Abdomen is not rigid.     Tenderness: There is abdominal tenderness in the left upper quadrant and left lower quadrant. There is no right CVA tenderness, left CVA tenderness or guarding.     Comments: Abdomen is soft, nondistended.  Tenderness palpation noted diffusely to the left upper and left lower quadrant.  No rigidity, guarding.  No left-sided CVA tenderness.  Musculoskeletal:        General: Normal range of motion.     Cervical back: Full passive range of motion without pain.  Skin:    General: Skin is warm and dry.     Capillary Refill: Capillary refill takes less than 2 seconds.  Neurological:     Mental Status: He is alert and oriented to person, place, and time.  Psychiatric:        Speech: Speech normal.     ED Results / Procedures / Treatments   Labs (all labs ordered are listed, but only abnormal results are displayed) Labs Reviewed  COMPREHENSIVE METABOLIC PANEL - Abnormal; Notable for the following components:      Result Value   Glucose, Bld 100 (*)    Creatinine, Ser 1.55 (*)    Total Protein 9.3 (*)    GFR calc non Af Amer 46 (*)    GFR calc Af Amer 53 (*)    Anion  gap 16 (*)    All other components within normal limits  CBC WITH DIFFERENTIAL/PLATELET - Abnormal; Notable for the following components:   WBC 13.0 (*)    RBC 5.83 (*)    Neutro Abs 7.9 (*)    Monocytes Absolute 1.1 (*)    All other components within normal limits  URINALYSIS, ROUTINE W REFLEX MICROSCOPIC - Abnormal; Notable for the following components:   Specific Gravity, Urine >1.030 (*)    Hgb urine dipstick SMALL (*)    Ketones, ur 15 (*)    All other components within normal limits  URINALYSIS, MICROSCOPIC (REFLEX) - Abnormal; Notable for the following components:   Bacteria, UA MANY (*)    All other components within normal limits  LIPASE, BLOOD    EKG None  Radiology CT ABDOMEN PELVIS W CONTRAST  Result Date: 01/31/2020 CLINICAL DATA:  Patient has complaints of left sided abdominal pain that started on Tuesday. Patient has a history of hernias with previous surgeries. Patient denies fever at this time. Hx of diverticulitis. EXAM: CT ABDOMEN AND PELVIS WITH CONTRAST TECHNIQUE: Multidetector CT imaging of the abdomen and pelvis was performed using the standard protocol following bolus administration of intravenous contrast. CONTRAST:  OMNIPAQUE IOHEXOL 300 MG/ML  SOLN COMPARISON:  CT abdomen pelvis 05/24/2008. FINDINGS: Lower chest: Bilateral lower lobe subsegmental atelectasis. Lingular linear atelectasis versus scarring. Small hiatal hernia. Otherwise no acute abnormality. Hepatobiliary: No focal liver abnormality is seen. No gallstones, gallbladder wall thickening, or biliary dilatation. Pancreas: Unremarkable. No pancreatic ductal dilatation or surrounding inflammatory changes. Spleen: Normal in size without focal abnormality. Adrenals/Urinary Tract: No adrenal nodule bilaterally. Bilateral kidneys enhance symmetrically. Subcentimeter hypodensities bilaterally are too small to characterize. No hydronephrosis. No nephrolithiasis. No hydroureter. No ureterolithiasis. The  urinary bladder is decompressed and unremarkable. Stomach/Bowel: Stomach is within normal limits. The appendix is not definitely identified. No evidence of small bowel wall thickening, distention, or inflammatory changes. Scattered colonic diverticulosis with  bowel wall thickening surrounding a distal descending colon diverticula (5:50, 2:44) and associated trace pericolonic fat stranding. No large bowel dilatation. Vascular/Lymphatic: No abdominal aorta or iliac aneurysm. No abdominal, pelvic, or inguinal lymphadenopathy. Reproductive: The prostate is grossly unremarkable. Metallic surgical densities are noted within and anterior to the prostate (2:70). Other: No intraperitoneal free fluid. No intraperitoneal free gas. No organized fluid collection. Musculoskeletal: Tiny umbilical fat containing hernia. No suspicious lytic or blastic osseous lesions. No acute displaced fracture. Multilevel degenerative changes of the spine. IMPRESSION: 1. Uncomplicated distal descending colon acute diverticulitis. No bowel perforation or abscess formation. 2. Small hiatal hernia Electronically Signed   By: Tish Frederickson M.D.   On: 01/31/2020 20:16    Procedures Procedures (including critical care time)  Medications Ordered in ED Medications  oxyCODONE-acetaminophen (PERCOCET/ROXICET) 5-325 MG per tablet 1 tablet (has no administration in time range)  sodium chloride 0.9 % bolus 1,000 mL (0 mLs Intravenous Stopped 01/31/20 2111)  ondansetron (ZOFRAN) injection 4 mg (4 mg Intravenous Given 01/31/20 1938)  morphine 4 MG/ML injection 4 mg (4 mg Intravenous Given 01/31/20 1940)  iohexol (OMNIPAQUE) 300 MG/ML solution 100 mL (100 mLs Intravenous Contrast Given 01/31/20 1952)  fentaNYL (SUBLIMAZE) injection 50 mcg (50 mcg Intravenous Given 01/31/20 2108)  ondansetron (ZOFRAN) injection 4 mg (4 mg Intravenous Given 01/31/20 2108)  amoxicillin-clavulanate (AUGMENTIN) 875-125 MG per tablet 1 tablet (1 tablet Oral Given 01/31/20  2120)    ED Course  I have reviewed the triage vital signs and the nursing notes.  Pertinent labs & imaging results that were available during my care of the patient were reviewed by me and considered in my medical decision making (see chart for details).    MDM Rules/Calculators/A&P                          69 year old male who presents for evaluation of left-sided abdominal pain x4 days.  History of diverticulitis and states it feels the same.  He is currently on Cipro and Flagyl.  He reports diarrhea and vomiting.  He is also had low-grade fever at home.  Went to urgent care and was sent over here for further evaluation.  On initial arrival, he is afebrile, nontoxic-appearing.  Vital signs are stable.  On exam, he has tenderness palpation of the left abdomen.  Concern for diverticulitis.  Also consider other intra-abdominal infectious pathology.  No palpable hernia noted on my exam.  Plan to check labs, CT and pelvis.  Lipase normal.  CMP shows BUN of 19, creatinine 1.55.  CBC shows leukocytosis of 13.0.  UA shows small hemoglobin.  No pyuria.  No leukocytes.  CT on pelvis shows acute uncomplicated diverticulitis.  No surrounding evidence of perforation or abscess.  Appendix is not definitively identified but there is no evidence of inflammatory changes.  Patient also with small hiatal hernia.  Reevaluation.  Patient reports improvement in pain after analgesics here in the ED.  He is hemodynamically stable.  I discussed with him regarding his CT findings.  At this time, his work-up is overall reassuring with no signs of abscess or perforation noted on CT scan.  At this time, he is well-appearing.  I discussed with him regarding switching his antibiotic to Augmentin versus admission for IV antibiotics.  We discussed risk versus benefits of trying Augmentin at home.  After engaging in shared decision-making, patient opted to try Augmentin at home.  Given his overall well appearance on exam and  improvement in pain as well as uncomplicated CT findings, I feel that this is reasonable.  Will give additional analgesics and reassess.  We will plan to p.o. challenge.  Patient able to tolerate p.o. without any difficulty.  Reports improvement in pain.  Repeat abdominal exam shows improved tenderness in left lower quadrant.  He was able tolerate p.o. without any vomiting.  Patient is afebrile here in the ED.  Patient switched to Augmentin.  Will give short course of pain medication for acute breakthrough pain.  Patient instructed to return the emergency department for any worsening pain. At this time, patient exhibits no emergent life-threatening condition that require further evaluation in ED. Discussed patient with Dr. Bernette MayersSheldon who is agreeable to plan. Patient had ample opportunity for questions and discussion. All patient's questions were answered with full understanding. Strict return precautions discussed. Patient expresses understanding and agreement to plan.   Portions of this note were generated with Scientist, clinical (histocompatibility and immunogenetics)Dragon dictation software. Dictation errors may occur despite best attempts at proofreading.  Final Clinical Impression(s) / ED Diagnoses Final diagnoses:  Diverticulitis    Rx / DC Orders ED Discharge Orders         Ordered    amoxicillin-clavulanate (AUGMENTIN) 875-125 MG tablet  Every 12 hours        01/31/20 2305    oxyCODONE-acetaminophen (PERCOCET/ROXICET) 5-325 MG tablet  Every 6 hours PRN        01/31/20 2305    ondansetron (ZOFRAN ODT) 4 MG disintegrating tablet  Every 8 hours PRN        01/31/20 2305           Maxwell CaulLayden, Aloysious Vangieson A, PA-C 01/31/20 2308    Pollyann SavoySheldon, Charles B, MD 01/31/20 2310

## 2020-01-31 NOTE — ED Triage Notes (Signed)
He was seen at [facility] this am for abdominal pain. He was told to come here for possible CT scan.

## 2020-01-31 NOTE — ED Provider Notes (Signed)
[facility]    CSN: [id number] Arrival date & time: 01/31/20  1059      History   Chief Complaint Chief Complaint  Patient presents with   Abdominal Pain    HPI Nicholas Cooke is a 69 y.o. male.   69 yo male who was sent here by his PCP for evaluation of his abdominal pain. He reports that his abdominal pain started in the low center of his abdomen and then moved to the left. His PCP, Dr. Clovis Riley, started him on Flagyl and Cipro 4 days ago but his symptoms are not getting better. He has had a low grade fever of 100.7 that responds to Tylenol. He has a history of diverticulitis first diagnosed in 2012. He does not eat seeds, nuts, or corn.     Past Medical History:  Diagnosis Date   Dental crowns present    Depression    Enlarged prostate    Hypertension    states under control with med., has been on med. x 2 yr.   Inguinal hernia 12/2015   Osteoarthritis     Patient Active Problem List   Diagnosis Date Noted   Osteoarthritis of right knee 05/14/2015   Right knee DJD 05/14/2015   Hemiplegia, unspecified, affecting nondominant side 06/13/2013   Left inguinal hernia 01/24/2012    Past Surgical History:  Procedure Laterality Date   APPENDECTOMY  yrs ago   CYSTOSCOPY WITH INSERTION OF UROLIFT N/A 07/03/2018   Procedure: CYSTOSCOPY WITH INSERTION OF UROLIFT;  Surgeon: Jerilee Field, MD;  Location: [facility];  Service: Urology;  Laterality: N/A;  ONLY NEEDS 30 MIN FOR PROCEDURE   INGUINAL HERNIA REPAIR  02/16/2012   Procedure: HERNIA REPAIR INGUINAL ADULT;  Surgeon: Shelly Rubenstein, MD;  Location: [facility];  Service: General;  Laterality: Left;  left inguinal hernia repair with mesh   INGUINAL HERNIA REPAIR Bilateral 12/23/2015   Procedure: LAPAROSCOPIC LEFT INGUINAL HERNIA WITH MESH;  Surgeon: Abigail Miyamoto, MD;  Location: [facility];  Service: General;  Laterality: Bilateral;   INSERTION OF  MESH Bilateral 12/23/2015   Procedure: INSERTION OF MESH;  Surgeon: Abigail Miyamoto, MD;  Location: [facility];  Service: General;  Laterality: Bilateral;   KNEE ARTHROSCOPY WITH MEDIAL MENISECTOMY Right 10/18/2013   Procedure: RIGHT KNEE ARTHROSCOPY WITH PARTIAL MEDIAL MENISECTOMY;  Surgeon: Javier Docker, MD;  Location: [facility];  Service: Orthopedics;  Laterality: Right;   SHOULDER ARTHROSCOPY WITH ROTATOR CUFF REPAIR Left 2001   TOTAL KNEE ARTHROPLASTY Right 05/14/2015   Procedure: REMOVAL OF HARDWARE AND RIGHT TOTAL KNEE ARTHROPLASTY;  Surgeon: Jene Every, MD;  Location: [facility];  Service: Orthopedics;  Laterality: Right;   ULNAR NERVE REPAIR Right 1996       Home Medications    Prior to Admission medications   Medication Sig Start Date End Date Taking? Authorizing Provider  lisinopril-hydrochlorothiazide (PRINZIDE,ZESTORETIC) 10-12.5 MG tablet Take 1 tablet by mouth daily.   Yes [provider]  Omega-3 Fatty Acids (FISH OIL PO) Take by mouth.   Yes [provider]  imipramine (TOFRANIL) 25 MG tablet Take 25 mg by mouth at bedtime. 03/07/15   [provider]  predniSONE (STERAPRED UNI-PAK 21 TAB) 10 MG (21) TBPK tablet 6 tabs for 1 day, then 5 tabs for 1 das, then 4 tabs for 1 day, then 3 tabs for 1 day, 2 tabs for 1 day, then 1 tab for 1 day 11/01/19   Dahlia Byes  A, NP  tadalafil (CIALIS) 5 MG tablet Take 5 mg by mouth daily as needed for erectile dysfunction.    [provider]  tamsulosin (FLOMAX) 0.4 MG CAPS capsule Take 0.4 mg by mouth at bedtime.    [provider]  tiZANidine (ZANAFLEX) 4 MG capsule Take 1 capsule (4 mg total) by mouth 3 (three) times daily. 11/01/19   Janace Aris, NP  traZODone (DESYREL) 50 MG tablet 50 mg. 01/19/20   [provider]    Family History History reviewed. No pertinent family history.  Social History Social History   Tobacco Use   Smoking status: Never Smoker    Smokeless tobacco: Never Used  Building services engineer Use: Never used  Substance Use Topics   Alcohol use: No   Drug use: No     Allergies   Patient has no known allergies.   Review of Systems Review of Systems  Constitutional: Positive for fever. Negative for activity change and appetite change.  HENT: Negative for congestion, rhinorrhea and sore throat.   Respiratory: Negative for cough and shortness of breath.   Cardiovascular: Negative for chest pain.  Gastrointestinal: Positive for diarrhea, nausea and vomiting. Negative for abdominal distention and blood in stool.  Genitourinary: Negative for difficulty urinating, dysuria, hematuria and urgency.  Musculoskeletal: Negative for arthralgias and myalgias.  Neurological: Negative.   Psychiatric/Behavioral: Negative.      Physical Exam Triage Vital Signs ED Triage Vitals  Enc Vitals Group     BP 01/31/20 1255 132/87     Pulse Rate 01/31/20 1255 90     Resp 01/31/20 1255 16     Temp 01/31/20 1255 98.7 F (37.1 C)     Temp Source 01/31/20 1255 Oral     SpO2 01/31/20 1255 100 %     Weight --      Height --      Head Circumference --      Peak Flow --      Pain Score 01/31/20 1257 6     Pain Loc --      Pain Edu? --      Excl. in GC? --    No data found.  Updated Vital Signs BP 132/87 (BP Location: Left Arm)    Pulse 90    Temp 98.7 F (37.1 C) (Oral)    Resp 16    SpO2 100%   Visual Acuity Right Eye Distance:   Left Eye Distance:   Bilateral Distance:    Right Eye Near:   Left Eye Near:    Bilateral Near:     Physical Exam Constitutional:      Appearance: He is well-developed.  HENT:     Head: Normocephalic and atraumatic.  Eyes:     Extraocular Movements: Extraocular movements intact.     Pupils: Pupils are equal, round, and reactive to light.  Cardiovascular:     Rate and Rhythm: Normal rate and regular rhythm.     Heart sounds: Normal heart sounds.  Pulmonary:     Effort: Pulmonary effort is  normal.     Breath sounds: Normal breath sounds.  Abdominal:     General: Abdomen is flat. Bowel sounds are normal.     Palpations: Abdomen is soft.     Tenderness: There is abdominal tenderness in the epigastric area, left upper quadrant and left lower quadrant.     Comments: Marked tenderness to palpation in the LUQ and LLQ with guarding. Mild epigastric tenderness. No  rigidity  Skin:    General: Skin is warm and dry.     Capillary Refill: Capillary refill takes less than 2 seconds.  Neurological:     General: No focal deficit present.     Mental Status: He is alert and oriented to person, place, and time.  Psychiatric:        Mood and Affect: Mood normal.        Behavior: Behavior normal.      UC Treatments / Results  Labs (all labs ordered are listed, but only abnormal results are displayed) Labs Reviewed - No data to display  EKG   Radiology No results found.  Procedures Procedures (including critical care time)  Medications Ordered in UC Medications - No data to display  Initial Impression / Assessment and Plan / UC Course  I have reviewed the triage vital signs and the nursing notes.  Pertinent labs & imaging results that were available during my care of the patient were reviewed by me and considered in my medical decision making (see chart for details).   Patient presents with marked LUQ & LLQ pain and a history of diverticulitis. He has been taking Cipro & Flagyl since Tuesday and his symptoms are worsening. Will send to ER for evaluation as there is concern for an abscess and patinet is in need of CT scan. Will offer ER here at [facility] or possibly going to [facility] for possible priority admission if required.    Final Clinical Impressions(s) / UC Diagnoses   Final diagnoses:  None   Discharge Instructions   None    ED Prescriptions    None     PDMP not reviewed this encounter.   Becky Augusta, NP 01/31/20 1421

## 2020-01-31 NOTE — ED Triage Notes (Signed)
Patient has complaints of left sided abdominal pain that started on Tuesday. Patient has a history of hernias with previous surgeries. Patient denies fever at this time. Patient was diagnosed with diverticulitis in 2012. Patient states he has experienced 2 episodes of diarrhea this morning and 3 last night. Patient states last night he experienced vomiting when trying to eat chicken soup. Patient also experiences increased pressure to abdomen with urination. Patient states his urine is a little darker in color.

## 2020-05-21 DIAGNOSIS — J342 Deviated nasal septum: Secondary | ICD-10-CM | POA: Diagnosis not present

## 2020-05-21 DIAGNOSIS — J310 Chronic rhinitis: Secondary | ICD-10-CM | POA: Diagnosis not present

## 2020-05-21 DIAGNOSIS — J343 Hypertrophy of nasal turbinates: Secondary | ICD-10-CM | POA: Diagnosis not present

## 2020-05-27 DIAGNOSIS — E782 Mixed hyperlipidemia: Secondary | ICD-10-CM | POA: Diagnosis not present

## 2020-05-27 DIAGNOSIS — M129 Arthropathy, unspecified: Secondary | ICD-10-CM | POA: Diagnosis not present

## 2020-05-27 DIAGNOSIS — N183 Chronic kidney disease, stage 3 unspecified: Secondary | ICD-10-CM | POA: Diagnosis not present

## 2020-05-27 DIAGNOSIS — G4700 Insomnia, unspecified: Secondary | ICD-10-CM | POA: Diagnosis not present

## 2020-05-27 DIAGNOSIS — J310 Chronic rhinitis: Secondary | ICD-10-CM | POA: Diagnosis not present

## 2020-05-27 DIAGNOSIS — I10 Essential (primary) hypertension: Secondary | ICD-10-CM | POA: Diagnosis not present

## 2020-06-15 DIAGNOSIS — J3489 Other specified disorders of nose and nasal sinuses: Secondary | ICD-10-CM | POA: Diagnosis not present

## 2020-06-15 DIAGNOSIS — J309 Allergic rhinitis, unspecified: Secondary | ICD-10-CM | POA: Diagnosis not present

## 2020-06-15 DIAGNOSIS — I10 Essential (primary) hypertension: Secondary | ICD-10-CM | POA: Insufficient documentation

## 2020-06-15 DIAGNOSIS — H6122 Impacted cerumen, left ear: Secondary | ICD-10-CM | POA: Diagnosis not present

## 2020-12-03 DIAGNOSIS — Z0000 Encounter for general adult medical examination without abnormal findings: Secondary | ICD-10-CM | POA: Diagnosis not present

## 2020-12-03 DIAGNOSIS — I10 Essential (primary) hypertension: Secondary | ICD-10-CM | POA: Diagnosis not present

## 2020-12-03 DIAGNOSIS — G4700 Insomnia, unspecified: Secondary | ICD-10-CM | POA: Diagnosis not present

## 2020-12-03 DIAGNOSIS — E782 Mixed hyperlipidemia: Secondary | ICD-10-CM | POA: Diagnosis not present

## 2020-12-28 DIAGNOSIS — G4700 Insomnia, unspecified: Secondary | ICD-10-CM | POA: Insufficient documentation

## 2021-01-29 ENCOUNTER — Other Ambulatory Visit: Payer: Medicare Other

## 2021-01-29 ENCOUNTER — Other Ambulatory Visit: Payer: Self-pay | Admitting: Family Medicine

## 2021-01-29 ENCOUNTER — Ambulatory Visit
Admission: RE | Admit: 2021-01-29 | Discharge: 2021-01-29 | Disposition: A | Discharge status: Home / Self Care | Payer: Medicare Other | Source: Ambulatory Visit | Attending: Family Medicine | Admitting: Family Medicine

## 2021-01-29 ENCOUNTER — Other Ambulatory Visit: Payer: Self-pay

## 2021-01-29 DIAGNOSIS — R1030 Lower abdominal pain, unspecified: Secondary | ICD-10-CM

## 2021-01-29 DIAGNOSIS — Z8719 Personal history of other diseases of the digestive system: Secondary | ICD-10-CM | POA: Insufficient documentation

## 2021-01-29 MED ORDER — IOPAMIDOL (ISOVUE-370) INJECTION 76%
60.0000 mL | Freq: Once | INTRAVENOUS | Status: AC | PRN
  Administered 2021-01-29: 60 mL via INTRAVENOUS

## 2021-02-18 ENCOUNTER — Ambulatory Visit
Admission: RE | Admit: 2021-02-18 | Discharge: 2021-02-18 | Disposition: A | Discharge status: Home / Self Care | Payer: Medicare Other | Source: Ambulatory Visit | Attending: Gastroenterology | Admitting: Gastroenterology

## 2021-02-18 ENCOUNTER — Other Ambulatory Visit: Payer: Self-pay | Admitting: Gastroenterology

## 2021-02-18 DIAGNOSIS — K5732 Diverticulitis of large intestine without perforation or abscess without bleeding: Secondary | ICD-10-CM

## 2021-02-18 MED ORDER — IOPAMIDOL (ISOVUE-300) INJECTION 61%
100.0000 mL | Freq: Once | INTRAVENOUS | Status: AC | PRN
  Administered 2021-02-18: 100 mL via INTRAVENOUS

## 2021-03-12 DIAGNOSIS — G453 Amaurosis fugax: Secondary | ICD-10-CM | POA: Insufficient documentation

## 2021-03-13 ENCOUNTER — Other Ambulatory Visit: Payer: Self-pay | Admitting: Family Medicine

## 2021-03-15 ENCOUNTER — Other Ambulatory Visit: Payer: Self-pay | Admitting: Family Medicine

## 2021-03-15 DIAGNOSIS — N1832 Chronic kidney disease, stage 3b: Secondary | ICD-10-CM

## 2021-03-15 DIAGNOSIS — G453 Amaurosis fugax: Secondary | ICD-10-CM

## 2021-03-16 ENCOUNTER — Other Ambulatory Visit: Payer: Medicare Other

## 2021-03-16 ENCOUNTER — Other Ambulatory Visit (HOSPITAL_COMMUNITY): Payer: Self-pay | Admitting: Family Medicine

## 2021-03-16 DIAGNOSIS — G453 Amaurosis fugax: Secondary | ICD-10-CM

## 2021-03-17 ENCOUNTER — Ambulatory Visit
Admission: RE | Admit: 2021-03-17 | Discharge: 2021-03-17 | Disposition: A | Discharge status: Home / Self Care | Payer: Medicare Other | Source: Ambulatory Visit | Attending: Family Medicine | Admitting: Family Medicine

## 2021-03-17 DIAGNOSIS — G453 Amaurosis fugax: Secondary | ICD-10-CM

## 2021-03-19 ENCOUNTER — Encounter: Payer: Self-pay | Admitting: Neurology

## 2021-03-24 ENCOUNTER — Other Ambulatory Visit: Payer: Self-pay

## 2021-03-24 ENCOUNTER — Encounter: Payer: Self-pay | Admitting: Neurology

## 2021-03-24 ENCOUNTER — Ambulatory Visit: Payer: Medicare Other | Admitting: Neurology

## 2021-03-24 VITALS — BP 124/84 | HR 84 | Resp 18 | Ht 69.0 in | Wt 182.0 lb

## 2021-03-24 DIAGNOSIS — R404 Transient alteration of awareness: Secondary | ICD-10-CM | POA: Diagnosis not present

## 2021-03-24 DIAGNOSIS — R55 Syncope and collapse: Secondary | ICD-10-CM | POA: Diagnosis not present

## 2021-03-24 NOTE — Patient Instructions (Addendum)
Schedule MRI brain with and without contrast  2. Schedule 1-hour EEG, if normal, we will do a 24-hour EEG  3. Schedule EKG at Cardiology office  4. Proceed with MRA head and neck as scheduled  5. As per [place] driving laws, no driving after an episode of loss of awareness until 6 months event-free  6. Follow-up after tests, call for any changes
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
## 2021-03-29 ENCOUNTER — Other Ambulatory Visit: Payer: Self-pay

## 2021-03-29 ENCOUNTER — Ambulatory Visit: Payer: Medicare Other | Admitting: Cardiology

## 2021-03-29 DIAGNOSIS — R404 Transient alteration of awareness: Secondary | ICD-10-CM

## 2021-03-29 DIAGNOSIS — R55 Syncope and collapse: Secondary | ICD-10-CM

## 2021-03-29 NOTE — Progress Notes (Signed)
EKG 03/29/2021: Sinus rhythm 93 bpm  Normal EKG

## 2021-03-29 NOTE — Progress Notes (Signed)
EKG 03/29/2021: Sinus rhythm 93 bpm  Normal EKG 

## 2021-03-30 ENCOUNTER — Ambulatory Visit (HOSPITAL_COMMUNITY): Payer: Medicare Other | Attending: Cardiovascular Disease

## 2021-03-30 DIAGNOSIS — G453 Amaurosis fugax: Secondary | ICD-10-CM

## 2021-03-30 LAB — ECHOCARDIOGRAM COMPLETE
Area-P 1/2: 4.33 cm2
S' Lateral: 2.5 cm

## 2021-04-02 ENCOUNTER — Ambulatory Visit
Admission: RE | Admit: 2021-04-02 | Discharge: 2021-04-02 | Disposition: A | Discharge status: Home / Self Care | Payer: Medicare Other | Source: Ambulatory Visit | Attending: Family Medicine | Admitting: Family Medicine

## 2021-04-02 ENCOUNTER — Other Ambulatory Visit: Payer: Medicare Other

## 2021-04-02 ENCOUNTER — Other Ambulatory Visit: Payer: Self-pay

## 2021-04-02 DIAGNOSIS — G453 Amaurosis fugax: Secondary | ICD-10-CM

## 2021-04-02 DIAGNOSIS — N1832 Chronic kidney disease, stage 3b: Secondary | ICD-10-CM

## 2021-04-07 ENCOUNTER — Other Ambulatory Visit: Payer: Self-pay

## 2021-04-07 ENCOUNTER — Ambulatory Visit: Payer: Medicare Other | Admitting: Neurology

## 2021-04-07 DIAGNOSIS — R55 Syncope and collapse: Secondary | ICD-10-CM

## 2021-04-07 DIAGNOSIS — R404 Transient alteration of awareness: Secondary | ICD-10-CM

## 2021-04-07 NOTE — Procedures (Signed)
ELECTROENCEPHALOGRAM REPORT  Date of Study: 04/07/2021  Patient's Name: Nicholas Cooke MRN: [id number] Date of Birth: 1950/11/18  Referring Provider: Dr. Patrcia Dolly  Clinical History: This is a 70 year old man with an episode of loss of awareness. EEG for classification.  Medications: AUGMENTIN 875-125 MG tablet TOFRANIL 25 MG tablet PRINZIDE,ZESTORETIC 10-12.5 MG tablet FISH OIL PO ZOFRAN ODT 4 MG disintegrating tablet PERCOCET/ROXICET 5-325 MG tablet STERAPRED UNI-PAK 21 TAB 10 MG (21) TBPK tablet CIALIS 5 MG tablet FLOMAX 0.4 MG CAPS capsule ZANAFLEX 4 MG capsule DESYREL 50 MG tablet  Technical Summary: A multichannel digital 1-hour EEG recording measured by the international 10-20 system with electrodes applied with paste and impedances below 5000 ohms performed in our laboratory with EKG monitoring in an awake and asleep patient.  Hyperventilation was not performed. Photic stimulation was performed.  The digital EEG was referentially recorded, reformatted, and digitally filtered in a variety of bipolar and referential montages for optimal display.    Description: The patient is awake and asleep during the recording.  During maximal wakefulness, there is a symmetric, medium voltage 10 Hz posterior dominant rhythm that attenuates with eye opening.  The record is symmetric.  During drowsiness and sleep, there is an increase in theta slowing of the background.  Vertex waves and symmetric sleep spindles were seen.  Photic stimulation did not elicit any abnormalities.  There were no epileptiform discharges or electrographic seizures seen.    EKG lead showed frequent extrasystolic beats.  Impression: This 1-hour awake and asleep EEG is normal.    Clinical Correlation: A normal EEG does not exclude a clinical diagnosis of epilepsy. If further clinical questions remain, prolonged EEG may be helpful.  Clinical correlation is advised.   Patrcia Dolly, M.D.

## 2021-04-11 ENCOUNTER — Other Ambulatory Visit: Payer: Medicare Other

## 2021-04-16 ENCOUNTER — Ambulatory Visit
Admission: RE | Admit: 2021-04-16 | Discharge: 2021-04-16 | Disposition: A | Discharge status: Home / Self Care | Payer: Medicare Other | Source: Ambulatory Visit | Attending: Neurology | Admitting: Neurology

## 2021-04-16 ENCOUNTER — Other Ambulatory Visit: Payer: Self-pay

## 2021-04-16 MED ORDER — GADOBENATE DIMEGLUMINE 529 MG/ML IV SOLN
17.0000 mL | Freq: Once | INTRAVENOUS | Status: AC | PRN
  Administered 2021-04-16: 17 mL via INTRAVENOUS

## 2021-04-21 ENCOUNTER — Telehealth: Payer: Self-pay | Admitting: Neurology

## 2021-04-21 NOTE — Telephone Encounter (Signed)
Dr. Fredric Dine from [facility] called in wanting the patient's MRA reviewed of his head and neck. She would like a call back so she can talk about the patient.

## 2021-05-21 NOTE — Telephone Encounter (Signed)
Spoke to PCP about MRA. At this point, no need to do CTA for non-visualization of left P2/P3, as medical management is the treatment (control vascular risk factors, aspirin 81mg  daily if not already taking). This would not cause his symptoms, recommend proceeding with prolonged EEG as discussed.   , pls schedule when you return, thanks

## 2021-05-24 ENCOUNTER — Other Ambulatory Visit: Payer: Self-pay | Admitting: Family Medicine

## 2021-05-24 DIAGNOSIS — J343 Hypertrophy of nasal turbinates: Secondary | ICD-10-CM

## 2021-05-24 DIAGNOSIS — J342 Deviated nasal septum: Secondary | ICD-10-CM

## 2021-05-31 ENCOUNTER — Telehealth: Payer: Self-pay | Admitting: *Deleted

## 2021-05-31 NOTE — Telephone Encounter (Signed)
LMOM to call me back at [phone] and ask for Nicholas Cooke to schedule a 24 hour ambulatory EEG. As of this call there is an available appt on Wednesday, February 8th at 11:30 to return on the 9th at noon for removal. I cannot hold this spot so please call as soon as you are able to set up this appt.

## 2021-06-02 ENCOUNTER — Ambulatory Visit
Admission: RE | Admit: 2021-06-02 | Discharge: 2021-06-02 | Disposition: A | Discharge status: Home / Self Care | Payer: Medicare Other | Source: Ambulatory Visit | Attending: Family Medicine | Admitting: Family Medicine

## 2021-06-02 DIAGNOSIS — J342 Deviated nasal septum: Secondary | ICD-10-CM

## 2021-06-02 DIAGNOSIS — J343 Hypertrophy of nasal turbinates: Secondary | ICD-10-CM

## 2021-06-10 ENCOUNTER — Other Ambulatory Visit: Payer: Self-pay

## 2021-06-10 DIAGNOSIS — R55 Syncope and collapse: Secondary | ICD-10-CM

## 2021-06-10 DIAGNOSIS — R404 Transient alteration of awareness: Secondary | ICD-10-CM

## 2021-07-28 ENCOUNTER — Ambulatory Visit: Payer: Medicare Other | Admitting: Neurology

## 2021-07-28 ENCOUNTER — Other Ambulatory Visit: Payer: Self-pay

## 2021-07-28 DIAGNOSIS — R55 Syncope and collapse: Secondary | ICD-10-CM | POA: Diagnosis not present

## 2021-07-28 DIAGNOSIS — R404 Transient alteration of awareness: Secondary | ICD-10-CM

## 2021-08-09 ENCOUNTER — Other Ambulatory Visit: Payer: Self-pay

## 2021-08-09 ENCOUNTER — Ambulatory Visit (INDEPENDENT_AMBULATORY_CARE_PROVIDER_SITE_OTHER): Payer: Medicare Other | Admitting: Neurology

## 2021-08-09 ENCOUNTER — Encounter: Payer: Self-pay | Admitting: Neurology

## 2021-08-09 VITALS — BP 120/81 | HR 73 | Ht 69.0 in | Wt 196.4 lb

## 2021-08-09 DIAGNOSIS — R55 Syncope and collapse: Secondary | ICD-10-CM

## 2021-08-09 DIAGNOSIS — R404 Transient alteration of awareness: Secondary | ICD-10-CM | POA: Diagnosis not present

## 2021-08-09 NOTE — Patient Instructions (Addendum)
Good to hear you are doing well. Keep a calendar of your symptoms. Continue control of blood pressure, cholesterol, sugar levels. Follow-up in 6 months, call for any changes. ?
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
## 2021-08-09 NOTE — Procedures (Signed)
ELECTROENCEPHALOGRAM REPORT ? ?Dates of Recording: 07/28/2021 11:22AM to 07/29/2021 11:39AM ? ?Patient's Name: Nicholas Cooke ?MRN: [id number] ?Date of Birth: Nov 20, 1950 ? ?Referring Provider: Dr. Ellouise Newer ? ?Procedure: 24-hour ambulatory video EEG ? ?History: This is a 71 year old man with episode of loss of awareness. He recalls having similar episodes in the 1990s. EEG for classification. ? ?Medications:  ?AUGMENTIN 875-125 MG tablet ?TOFRANIL 25 MG tablet ?PRINZIDE,ZESTORETIC 10-12.5 MG tablet ?FISH OIL PO ?ZOFRAN ODT 4 MG disintegrating tablet ?PERCOCET/ROXICET 5-325 MG tablet ?STERAPRED UNI-PAK 21 TAB 10 MG (21) TBPK tablet ?CIALIS 5 MG tablet ?FLOMAX 0.4 MG CAPS capsule ?ZANAFLEX 4 MG capsule ?DESYREL 50 MG tablet ? ?Technical Summary: ?This is a 24-hour multichannel digital video EEG recording measured by the international 10-20 system with electrodes applied with paste and impedances below 5000 ohms performed as portable with EKG monitoring.  The digital EEG was referentially recorded, reformatted, and digitally filtered in a variety of bipolar and referential montages for optimal display.   ? ?DESCRIPTION OF RECORDING: ?During maximal wakefulness, the background activity consisted of a symmetric 10 Hz posterior dominant rhythm which was reactive to eye opening.  There were no epileptiform discharges or focal slowing seen in wakefulness. ? ?During the recording, the patient progresses through wakefulness, drowsiness, and Stage 2 sleep.  Again, there were no epileptiform discharges seen. ? ?Events: ?On 3/15 at 1427 hours, he has back pain. Patient not on video. Electrographically, there were no EEG or EKG changes seen. ? ?On 3/15 at 1811 hours, he has dizziness. Patient not on video. Electrographically, there were no EEG or EKG changes seen. ? ?On 3/15 at 2347 hours, he has pain. Patient not visible on video. Electrographically, there were no EEG or EKG changes seen. ? ?On 3/16 at 0205 hours, he has pain.  Patient not visible on video. Electrographically, there were no EEG or EKG changes seen. ? ?On 3/16 at 0606 hours, he has dizziness. Patient not on video. Electrographically, there were no EEG or EKG changes seen. ? ?On 3/16 at 0704 and 0937 hours, he has back pain. Patient not on video. Electrographically, there were no EEG or EKG changes seen. ? ?There were no electrographic seizures seen.  EKG lead was unremarkable. ? ?IMPRESSION: This 24-hour ambulatory video EEG study is normal.   ? ?CLINICAL CORRELATION: A normal EEG does not exclude a clinical diagnosis of epilepsy. Episodes of dizziness and back pain did not show EEG correlate. Typical events were not captured.  If further clinical questions remain, inpatient video EEG monitoring may be helpful. ? ? ?Ellouise Newer, M.D. ? ?

## 2021-09-21 ENCOUNTER — Ambulatory Visit: Payer: Medicare Other | Admitting: Neurology

## 2022-02-22 ENCOUNTER — Ambulatory Visit: Payer: Medicare Other | Admitting: Neurology

## 2022-04-15 ENCOUNTER — Other Ambulatory Visit (HOSPITAL_COMMUNITY): Payer: Self-pay | Admitting: Gastroenterology

## 2022-04-15 DIAGNOSIS — Z8719 Personal history of other diseases of the digestive system: Secondary | ICD-10-CM

## 2022-04-15 DIAGNOSIS — R1032 Left lower quadrant pain: Secondary | ICD-10-CM

## 2022-04-16 ENCOUNTER — Ambulatory Visit (HOSPITAL_COMMUNITY)
Admission: RE | Admit: 2022-04-16 | Discharge: 2022-04-16 | Disposition: A | Discharge status: Home / Self Care | Payer: Medicare Other | Source: Ambulatory Visit | Attending: Gastroenterology | Admitting: Gastroenterology

## 2022-04-16 DIAGNOSIS — Z8719 Personal history of other diseases of the digestive system: Secondary | ICD-10-CM | POA: Diagnosis present

## 2022-04-16 DIAGNOSIS — R1032 Left lower quadrant pain: Secondary | ICD-10-CM | POA: Diagnosis present

## 2022-04-16 MED ORDER — IOHEXOL 300 MG/ML  SOLN
100.0000 mL | Freq: Once | INTRAMUSCULAR | Status: AC | PRN
  Administered 2022-04-16: 100 mL via INTRAVENOUS

## 2022-05-17 ENCOUNTER — Ambulatory Visit (INDEPENDENT_AMBULATORY_CARE_PROVIDER_SITE_OTHER): Payer: Medicare Other | Admitting: Family Medicine

## 2022-05-17 ENCOUNTER — Encounter: Payer: Self-pay | Admitting: Family Medicine

## 2022-05-17 VITALS — BP 120/68 | HR 74 | Temp 98.2°F | Ht 69.0 in | Wt 193.0 lb

## 2022-05-17 DIAGNOSIS — I10 Essential (primary) hypertension: Secondary | ICD-10-CM | POA: Diagnosis not present

## 2022-05-17 DIAGNOSIS — K5790 Diverticulosis of intestine, part unspecified, without perforation or abscess without bleeding: Secondary | ICD-10-CM | POA: Diagnosis not present

## 2022-05-17 DIAGNOSIS — E785 Hyperlipidemia, unspecified: Secondary | ICD-10-CM | POA: Insufficient documentation

## 2022-05-17 DIAGNOSIS — N400 Enlarged prostate without lower urinary tract symptoms: Secondary | ICD-10-CM | POA: Insufficient documentation

## 2022-05-17 DIAGNOSIS — F339 Major depressive disorder, recurrent, unspecified: Secondary | ICD-10-CM | POA: Insufficient documentation

## 2022-05-17 DIAGNOSIS — N1832 Chronic kidney disease, stage 3b: Secondary | ICD-10-CM | POA: Diagnosis not present

## 2022-05-17 NOTE — Assessment & Plan Note (Signed)
Reports being well-controlled on Lexapro 20mg . PHQ 5 today in office. Denies SI. Continue Trazodone 50mg  QHS and Lexapro 20mg  daily.

## 2022-05-17 NOTE — Assessment & Plan Note (Signed)
04/18/2022 labs normal lipids and LFTs. Continue crestor 10mg  daily. Recheck in 3-6 months.

## 2022-05-17 NOTE — Assessment & Plan Note (Signed)
Chronic. Goal <130/80. Continue Lisinopril-HCTZ 10-12.5mg  daily. Denies chest pain, SOB, palpitations, lightheadedness/dizziness and instructed to seek medical care for any of the above.

## 2022-05-17 NOTE — Patient Instructions (Signed)
It was great to meet you today and I'm excited to have you join the [facility] practice. I hope you had a positive experience today! If you feel so inclined, please feel free to recommend our practice to friends and family. Haim Hansson, FNP-C  

## 2022-05-17 NOTE — Assessment & Plan Note (Signed)
Stable without flare. Colonoscopy 04/16/2022. States he consumes a fiber rich diet, dicussed aggravating foods and symptoms to return to office for.

## 2022-05-17 NOTE — Assessment & Plan Note (Signed)
Cr stable at baseline around 1.6, recent 1.82 on 04/18/2022. GFR 40. BP well controlled. Reports staying hydrated and avoiding NSAIDs.

## 2022-05-17 NOTE — Progress Notes (Signed)
New Patient Office Visit  Subjective    Patient ID: Nicholas Cooke, male    DOB: 07-Nov-1950  Age: 72 y.o. MRN: [id number]  CC:  Chief Complaint  Patient presents with   Establish Care    Nose issue w/abnormal bone growth in nostril, wax-L ear plus ringing     HPI Nicholas Cooke presents to establish care. Oriented to practice routines and expectations. He does see a Dealer at [employer] for [place] s/p U[employer]. He has a PMH of diverticulosis, HTN, HLD, depression, inguinal hernia. Exercise consists of walking the dog or stairs every other day. Concerns today include worsening "bone spurs" of his nasal bone, previously seen in 06/2021 by ENT who recommended saline flushes and Flonase, I recommended he return to them for further evaluation if his symptoms were worsening, no s/s of acute process or infection. He did have a CT and MRI done with no significant findings.  Physical and labs were done last month at [facility] with previous PCP.  PSA normal, urology checks annually. Colonoscopy 04/2021 with polyps and diverticulosis Never smoker, second-hand exposure as child Vaccines UTD per pt    Outpatient Encounter Medications as of 05/17/2022  Medication Sig   CRESTOR 10 MG tablet Take 10 mg by mouth daily.   escitalopram (LEXAPRO) 20 MG tablet Take 20 mg by mouth daily.   lisinopril-hydrochlorothiazide (PRINZIDE,ZESTORETIC) 10-12.5 MG tablet Take 1 tablet by mouth daily.   tadalafil (CIALIS) 5 MG tablet Take 5 mg by mouth daily as needed for erectile dysfunction.   traZODone (DESYREL) 50 MG tablet 50 mg.   No facility-administered encounter medications on file as of 05/17/2022.    Past Medical History:  Diagnosis Date   Dental crowns present    Depression    Enlarged prostate    Hypertension    states under control with med., has been on med. x 2 yr.   Inguinal hernia 12/2015   Osteoarthritis     Past Surgical History:  Procedure Laterality Date   APPENDECTOMY  yrs ago    [place] N/A 07/03/2018   Procedure: CYSTOSCOPY WITH INSERTION OF UROLIFT;  Surgeon: Festus Aloe, MD;  Location: [facility];  Service: Urology;  Laterality: N/A;  ONLY NEEDS 30 MIN FOR PROCEDURE   INGUINAL HERNIA REPAIR  02/16/2012   Procedure: HERNIA REPAIR INGUINAL ADULT;  Surgeon: Harl Bowie, MD;  Location: [place];  Service: General;  Laterality: Left;  left inguinal hernia repair with mesh   INGUINAL HERNIA REPAIR Bilateral 12/23/2015   Procedure: LAPAROSCOPIC LEFT INGUINAL HERNIA WITH MESH;  Surgeon: Coralie Keens, MD;  Location: [place];  Service: General;  Laterality: Bilateral;   INSERTION OF MESH Bilateral 12/23/2015   Procedure: INSERTION OF MESH;  Surgeon: Coralie Keens, MD;  Location: [place];  Service: General;  Laterality: Bilateral;   KNEE ARTHROSCOPY WITH MEDIAL MENISECTOMY Right 10/18/2013   Procedure: RIGHT KNEE ARTHROSCOPY WITH PARTIAL MEDIAL MENISECTOMY;  Surgeon: Johnn Hai, MD;  Location: [facility];  Service: Orthopedics;  Laterality: Right;   SHOULDER ARTHROSCOPY WITH ROTATOR CUFF REPAIR Left 2001   TOTAL KNEE ARTHROPLASTY Right 05/14/2015   Procedure: REMOVAL OF HARDWARE AND RIGHT TOTAL KNEE ARTHROPLASTY;  Surgeon: Susa Day, MD;  Location: [facility];  Service: Orthopedics;  Laterality: Right;   ULNAR NERVE REPAIR Right 1996    History reviewed. No pertinent family history.  Social History   Socioeconomic History   Marital status: Married  Spouse name: Not on file   Number of children: Not on file   Years of education: Not on file   Highest education level: Not on file  Occupational History   Not on file  Tobacco Use   Smoking status: Never   Smokeless tobacco: Never  Vaping Use   Vaping Use: Never used  Substance and Sexual Activity   Alcohol use: No   Drug use: No   Sexual activity: Not on file  Other Topics Concern   Not on file  Social  History Narrative   Right handed   Drinks caffeine   Two story home   Social Determinants of Health   Financial Resource Strain: Not on file  Food Insecurity: Not on file  Transportation Needs: Not on file  Physical Activity: Not on file  Stress: Not on file  Social Connections: Not on file  Intimate Partner Violence: Not on file    Review of Systems  Constitutional: Negative.   HENT: Negative.    Eyes: Negative.   Respiratory: Negative.    Cardiovascular: Negative.   Gastrointestinal: Negative.   Genitourinary: Negative.   Musculoskeletal: Negative.   Skin: Negative.   Neurological: Negative.   Endo/Heme/Allergies: Negative.   Psychiatric/Behavioral: Negative.    All other systems reviewed and are negative.       Objective    BP 120/68   Pulse 74   Temp 98.2 F (36.8 C) (Oral)   Ht 5\' 9"  (1.753 m)   Wt 193 lb (87.5 kg)   SpO2 94%   BMI 28.50 kg/m   Physical Exam Vitals and nursing note reviewed.  Constitutional:      Appearance: Normal appearance. He is normal weight.  HENT:     Head: Normocephalic and atraumatic.     Nose:     Comments: 2 small palpable nodules present to the left nasal bone, no edema, erythema, or fluctuance. Cardiovascular:     Rate and Rhythm: Normal rate and regular rhythm.     Pulses: Normal pulses.     Heart sounds: Normal heart sounds.  Pulmonary:     Effort: Pulmonary effort is normal.     Breath sounds: Normal breath sounds.  Skin:    General: Skin is warm and dry.     Capillary Refill: Capillary refill takes less than 2 seconds.  Neurological:     General: No focal deficit present.     Mental Status: He is alert and oriented to person, place, and time. Mental status is at baseline.  Psychiatric:        Mood and Affect: Mood normal.        Behavior: Behavior normal.        Thought Content: Thought content normal.        Judgment: Judgment normal.         Assessment & Plan:   Problem List Items Addressed This  Visit       Cardiovascular and Mediastinum   Essential hypertension - Primary    Chronic. Goal <130/80. Continue Lisinopril-HCTZ 10-12.5mg  daily. Denies chest pain, SOB, palpitations, lightheadedness/dizziness and instructed to seek medical care for any of the above.        Digestive   Diverticulosis    Stable without flare. Colonoscopy 04/16/2022. States he consumes a fiber rich diet, dicussed aggravating foods and symptoms to return to office for.        Genitourinary   Benign prostatic hyperplasia without lower urinary tract symptoms   Stage 3b chronic  kidney disease ([place])    Cr stable at baseline around 1.6, recent 1.82 on 04/18/2022. GFR 40. BP well controlled. Reports staying hydrated and avoiding NSAIDs.        Other   Hyperlipidemia   Depression, recurrent ([place])    Reports being well-controlled on Lexapro 20mg . PHQ 5 today in office. Denies SI. Continue Trazodone 50mg  QHS and Lexapro 20mg  daily.       Return in about 6 months (around 11/15/2022) for hypertension.   Rubie Maid, FNP

## 2022-05-26 NOTE — Addendum Note (Signed)
Addended by: Rubie Maid on: 05/26/2022 04:32 PM   Modules accepted: Level of Service

## 2022-06-17 ENCOUNTER — Telehealth: Payer: Self-pay

## 2022-06-17 NOTE — Telephone Encounter (Signed)
Pt called asking for Lexapro and trazodone to be refilled?  Please advice?

## 2022-06-20 ENCOUNTER — Other Ambulatory Visit: Payer: Self-pay | Admitting: Family Medicine

## 2022-06-20 DIAGNOSIS — F339 Major depressive disorder, recurrent, unspecified: Secondary | ICD-10-CM

## 2022-06-20 MED ORDER — ESCITALOPRAM OXALATE 20 MG PO TABS: 20.0000 mg | ORAL_TABLET | Freq: Every day | ORAL | 3 refills | Status: DC

## 2022-06-20 MED ORDER — TRAZODONE HCL 50 MG PO TABS: 50.0000 mg | ORAL_TABLET | Freq: Every evening | ORAL | 0 refills | Status: DC | PRN

## 2022-06-21 ENCOUNTER — Telehealth: Payer: Self-pay | Admitting: Family Medicine

## 2022-06-21 ENCOUNTER — Other Ambulatory Visit: Payer: Self-pay

## 2022-06-21 MED ORDER — ESCITALOPRAM OXALATE 20 MG PO TABS: 20.0000 mg | ORAL_TABLET | Freq: Every day | ORAL | 3 refills | Status: DC

## 2022-06-21 MED ORDER — TRAZODONE HCL 50 MG PO TABS: 50.0000 mg | ORAL_TABLET | Freq: Every day | ORAL | 0 refills | Status: DC

## 2022-06-21 NOTE — Telephone Encounter (Signed)
Per pt, did not want meds to be sent to [facility]. Re-order and Pt aware medication has been sent to [facility].

## 2022-06-21 NOTE — Telephone Encounter (Signed)
Prescription Request  06/21/2022  Is this a "Controlled Substance" medicine? No  LOV: 05/17/2022  What is the name of the medication or equipment?   escitalopram (LEXAPRO) 20 MG tablet [[id number]]   traZODone (DESYREL) 50 MG tablet [[id number]]   ROSUVASTATIN TAB 10MG  (prescribed by previous provider) - took last pill 2 days ago **Pharmacy needs these meds on file to prevent a lapse in doses**  Have you contacted your pharmacy to request a refill? Yes   Which pharmacy would you like this sent to?  [facility], [address] [place] [address] Phone: [phone] Fax: [phone]    Patient notified that their request is being sent to the clinical staff for review and that they should receive a response within 2 business days.   Please advise patient at [phone].

## 2022-06-27 ENCOUNTER — Telehealth: Payer: Self-pay

## 2022-06-27 NOTE — Telephone Encounter (Signed)
Pt request refill on his crestor? Rx has been expired 11/22? Per pt stated that during initial visit spoke w/you about getting it refill?  Pls advice

## 2022-06-29 ENCOUNTER — Telehealth: Payer: Self-pay | Admitting: Family Medicine

## 2022-06-29 NOTE — Telephone Encounter (Signed)
Prescription Request  06/29/2022  Is this a "Controlled Substance" medicine? No  LOV: 05/17/2022  What is the name of the medication or equipment?   CRESTOR 10 MG tablet   Have you contacted your pharmacy to request a refill? Yes   Which pharmacy would you like this sent to?  [facility], [address] [place] [address] Phone: [phone] Fax: [phone]    Patient notified that their request is being sent to the clinical staff for review and that they should receive a response within 2 business days.   Please advise pharmacist.

## 2022-06-29 NOTE — Telephone Encounter (Signed)
Spoke w/pt re: crestor.   Per pt stated that he will have [facility] send over lab results.   Per pt his last refill was 11/23 #90DS, he has been off it for over a week.   Per pt if he can't find lab-lipid results, pt will come in tom for another lab work done.  Will call and let us know.   I, did try to call [facility] to see if they will fax over the lab results, however it was only their fax number. We wait for pt to call back.

## 2022-06-30 ENCOUNTER — Other Ambulatory Visit: Payer: Medicare Other

## 2022-06-30 ENCOUNTER — Other Ambulatory Visit: Payer: Self-pay

## 2022-06-30 DIAGNOSIS — I10 Essential (primary) hypertension: Secondary | ICD-10-CM

## 2022-06-30 DIAGNOSIS — E785 Hyperlipidemia, unspecified: Secondary | ICD-10-CM

## 2022-06-30 DIAGNOSIS — N1832 Chronic kidney disease, stage 3b: Secondary | ICD-10-CM

## 2022-06-30 MED ORDER — CRESTOR 10 MG PO TABS: 10.0000 mg | ORAL_TABLET | Freq: Every day | ORAL | 3 refills | Status: DC

## 2022-07-01 LAB — COMPLETE METABOLIC PANEL WITH GFR
AG Ratio: 1.3 (calc) (ref 1.0–2.5)
ALT: 15 U/L (ref 9–46)
AST: 20 U/L (ref 10–35)
Albumin: 4 g/dL (ref 3.6–5.1)
Alkaline phosphatase (APISO): 57 U/L (ref 35–144)
BUN/Creatinine Ratio: 8 (calc) (ref 6–22)
BUN: 13 mg/dL (ref 7–25)
CO2: 25 mmol/L (ref 20–32)
Calcium: 9 mg/dL (ref 8.6–10.3)
Chloride: 103 mmol/L (ref 98–110)
Creat: 1.59 mg/dL — ABNORMAL HIGH (ref 0.70–1.35)
Globulin: 3 g/dL (calc) (ref 1.9–3.7)
Glucose, Bld: 85 mg/dL (ref 65–99)
Potassium: 3.8 mmol/L (ref 3.5–5.3)
Sodium: 138 mmol/L (ref 135–146)
Total Bilirubin: 0.5 mg/dL (ref 0.2–1.2)
Total Protein: 7 g/dL (ref 6.1–8.1)
eGFR: 47 mL/min/{1.73_m2} — ABNORMAL LOW (ref >=60)

## 2022-07-01 LAB — CBC WITH DIFFERENTIAL/PLATELET
Absolute Monocytes: 711 cells/uL (ref 200–950)
Basophils Absolute: 41 cells/uL (ref 0–200)
Basophils Relative: 0.6 %
Eosinophils Absolute: 173 cells/uL (ref 15–500)
Eosinophils Relative: 2.5 %
HCT: 46.2 % (ref 38.5–50.0)
Hemoglobin: 15.8 g/dL (ref 13.2–17.1)
Lymphs Abs: 3505 cells/uL (ref 850–3900)
MCH: 29.1 pg (ref 27.0–33.0)
MCHC: 34.2 g/dL (ref 32.0–36.0)
MCV: 85.1 fL (ref 80.0–100.0)
MPV: 9.7 fL (ref 7.5–12.5)
Monocytes Relative: 10.3 %
Neutro Abs: 2470 cells/uL (ref 1500–7800)
Neutrophils Relative %: 35.8 %
Platelets: 188 10*3/uL (ref 140–400)
RBC: 5.43 10*6/uL (ref 4.20–5.80)
RDW: 14 % (ref 11.0–15.0)
Total Lymphocyte: 50.8 %
WBC: 6.9 10*3/uL (ref 3.8–10.8)

## 2022-07-01 LAB — LIPID PANEL
Cholesterol: 136 mg/dL (ref <200)
HDL: 30 mg/dL — ABNORMAL LOW (ref >=40)
LDL Cholesterol (Calc): 75 mg/dL (calc)
Non-HDL Cholesterol (Calc): 106 mg/dL (calc) (ref <130)
Total CHOL/HDL Ratio: 4.5 (calc) (ref <5.0)
Triglycerides: 214 mg/dL — ABNORMAL HIGH (ref <150)

## 2022-07-05 ENCOUNTER — Telehealth: Payer: Self-pay

## 2022-07-05 NOTE — Telephone Encounter (Signed)
Pt came into office very upset about a med that he has not been able to get and has been off for little while. Informed pt that his pcp/nurse was out of the office today and would not see message until the next day to get an answer. Pt wanting Dr. Dennard Schaumann to fix the situation about his med, but pt was informed that he could not do that, as it would have to be his pcp. Pt was informed that  a message would be sent to provider. Please advise.  Cb#: [phone]

## 2022-07-06 ENCOUNTER — Other Ambulatory Visit: Payer: Self-pay

## 2022-07-06 MED ORDER — ROSUVASTATIN CALCIUM 10 MG PO TABS: 10.0000 mg | ORAL_TABLET | Freq: Every day | ORAL | 3 refills | Status: DC

## 2022-07-06 NOTE — Telephone Encounter (Signed)
Called spoke w/pt, discontinue crestor 20m and reorder for Rosuvastatin 1110mper pt, stated that insurance will not cover Crestor

## 2022-07-12 ENCOUNTER — Ambulatory Visit (INDEPENDENT_AMBULATORY_CARE_PROVIDER_SITE_OTHER): Payer: Medicare Other

## 2022-07-12 VITALS — BP 124/62 | Ht 69.0 in | Wt 195.0 lb

## 2022-07-12 DIAGNOSIS — Z0000 Encounter for general adult medical examination without abnormal findings: Secondary | ICD-10-CM

## 2022-07-12 NOTE — Patient Instructions (Addendum)
[name] , Thank you for taking time to come for your Medicare Wellness Visit. I appreciate your ongoing commitment to your health goals. Please review the following plan we discussed and let me know if I can assist you in the future.   These are the goals we discussed:  Goals      Remain active and independent        This is a list of the screening recommended for you and due dates:  Health Maintenance  Topic Date Due   Zoster (Shingles) Vaccine (2 of 2) 08/16/2022*   Hepatitis C Screening: USPSTF Recommendation to screen - Ages 18-79 yo.  05/18/2023*   Medicare Annual Wellness Visit  07/13/2023   Colon Cancer Screening  05/15/2031   Pneumonia Vaccine  Completed   Flu Shot  Completed   COVID-19 Vaccine  Completed   HPV Vaccine  Aged Out   DTaP/Tdap/Td vaccine  Discontinued  *Topic was postponed. The date shown is not the original due date.    Advanced directives: Advance directive discussed with you today. I have provided a copy for you to complete at home and have notarized. Once this is complete please bring a copy in to our office so we can scan it into your chart.   Conditions/risks identified: Aim for 30 minutes of exercise or brisk walking, 6-8 glasses of water, and 5 servings of fruits and vegetables each day.   Next appointment: Follow up in one year for your annual wellness visit.   Preventive Care 75 Years and Older, Male  Preventive care refers to lifestyle choices and visits with your health care provider that can promote health and wellness. What does preventive care include? A yearly physical exam. This is also called an annual well check. Dental exams once or twice a year. Routine eye exams. Ask your health care provider how often you should have your eyes checked. Personal lifestyle choices, including: Daily care of your teeth and gums. Regular physical activity. Eating a healthy diet. Avoiding tobacco and drug use. Limiting alcohol use. Practicing safe  sex. Taking low doses of aspirin every day. Taking vitamin and mineral supplements as recommended by your health care provider. What happens during an annual well check? The services and screenings done by your health care provider during your annual well check will depend on your age, overall health, lifestyle risk factors, and family history of disease. Counseling  Your health care provider may ask you questions about your: Alcohol use. Tobacco use. Drug use. Emotional well-being. Home and relationship well-being. Sexual activity. Eating habits. History of falls. Memory and ability to understand (cognition). Work and work Statistician. Screening  You may have the following tests or measurements: Height, weight, and BMI. Blood pressure. Lipid and cholesterol levels. These may be checked every 5 years, or more frequently if you are over 77 years old. Skin check. Lung cancer screening. You may have this screening every year starting at age 64 if you have a 30-pack-year history of smoking and currently smoke or have quit within the past 15 years. Fecal occult blood test (FOBT) of the stool. You may have this test every year starting at age 16. Flexible sigmoidoscopy or colonoscopy. You may have a sigmoidoscopy every 5 years or a colonoscopy every 10 years starting at age 27. Prostate cancer screening. Recommendations will vary depending on your family history and other risks. Hepatitis C blood test. Hepatitis B blood test. Sexually transmitted disease (STD) testing. Diabetes screening. This is done by checking  your blood sugar (glucose) after you have not eaten for a while (fasting). You may have this done every 1-3 years. Abdominal aortic aneurysm (AAA) screening. You may need this if you are a current or former smoker. Osteoporosis. You may be screened starting at age 52 if you are at high risk. Talk with your health care provider about your test results, treatment options, and if  necessary, the need for more tests. Vaccines  Your health care provider may recommend certain vaccines, such as: Influenza vaccine. This is recommended every year. Tetanus, diphtheria, and acellular pertussis (Tdap, Td) vaccine. You may need a Td booster every 10 years. Zoster vaccine. You may need this after age 65. Pneumococcal 13-valent conjugate (PCV13) vaccine. One dose is recommended after age 26. Pneumococcal polysaccharide (PPSV23) vaccine. One dose is recommended after age 1. Talk to your health care provider about which screenings and vaccines you need and how often you need them. This information is not intended to replace advice given to you by your health care provider. Make sure you discuss any questions you have with your health care provider. Document Released: 05/29/2015 Document Revised: 01/20/2016 Document Reviewed: 03/03/2015 Elsevier Interactive Patient Education  2017 [place] Prevention in the Home Falls can cause injuries. They can happen to people of all ages. There are many things you can do to make your home safe and to help prevent falls. What can I do on the outside of my home? Regularly fix the edges of walkways and driveways and fix any cracks. Remove anything that might make you trip as you walk through a door, such as a raised step or threshold. Trim any bushes or trees on the path to your home. Use bright outdoor lighting. Clear any walking paths of anything that might make someone trip, such as rocks or tools. Regularly check to see if handrails are loose or broken. Make sure that both sides of any steps have handrails. Any raised decks and porches should have guardrails on the edges. Have any leaves, snow, or ice cleared regularly. Use sand or salt on walking paths during winter. Clean up any spills in your garage right away. This includes oil or grease spills. What can I do in the bathroom? Use night lights. Install grab bars by the toilet  and in the tub and shower. Do not use towel bars as grab bars. Use non-skid mats or decals in the tub or shower. If you need to sit down in the shower, use a plastic, non-slip stool. Keep the floor dry. Clean up any water that spills on the floor as soon as it happens. Remove soap buildup in the tub or shower regularly. Attach bath mats securely with double-sided non-slip rug tape. Do not have throw rugs and other things on the floor that can make you trip. What can I do in the bedroom? Use night lights. Make sure that you have a light by your bed that is easy to reach. Do not use any sheets or blankets that are too big for your bed. They should not hang down onto the floor. Have a firm chair that has side arms. You can use this for support while you get dressed. Do not have throw rugs and other things on the floor that can make you trip. What can I do in the kitchen? Clean up any spills right away. Avoid walking on wet floors. Keep items that you use a lot in easy-to-reach places. If you need to reach something  above you, use a strong step stool that has a grab bar. Keep electrical cords out of the way. Do not use floor polish or wax that makes floors slippery. If you must use wax, use non-skid floor wax. Do not have throw rugs and other things on the floor that can make you trip. What can I do with my stairs? Do not leave any items on the stairs. Make sure that there are handrails on both sides of the stairs and use them. Fix handrails that are broken or loose. Make sure that handrails are as long as the stairways. Check any carpeting to make sure that it is firmly attached to the stairs. Fix any carpet that is loose or worn. Avoid having throw rugs at the top or bottom of the stairs. If you do have throw rugs, attach them to the floor with carpet tape. Make sure that you have a light switch at the top of the stairs and the bottom of the stairs. If you do not have them, ask someone to add  them for you. What else can I do to help prevent falls? Wear shoes that: Do not have high heels. Have rubber bottoms. Are comfortable and fit you well. Are closed at the toe. Do not wear sandals. If you use a stepladder: Make sure that it is fully opened. Do not climb a closed stepladder. Make sure that both sides of the stepladder are locked into place. Ask someone to hold it for you, if possible. Clearly mark and make sure that you can see: Any grab bars or handrails. First and last steps. Where the edge of each step is. Use tools that help you move around (mobility aids) if they are needed. These include: Canes. Walkers. Scooters. Crutches. Turn on the lights when you go into a dark area. Replace any light bulbs as soon as they burn out. Set up your furniture so you have a clear path. Avoid moving your furniture around. If any of your floors are uneven, fix them. If there are any pets around you, be aware of where they are. Review your medicines with your doctor. Some medicines can make you feel dizzy. This can increase your chance of falling. Ask your doctor what other things that you can do to help prevent falls. This information is not intended to replace advice given to you by your health care provider. Make sure you discuss any questions you have with your health care provider. Document Released: 02/26/2009 Document Revised: 10/08/2015 Document Reviewed: 06/06/2014 Elsevier Interactive Patient Education  2017 [employer].

## 2022-07-12 NOTE — Progress Notes (Signed)
Subjective:   Nicholas Cooke is a 72 y.o. male who presents for an Initial Medicare Annual Wellness Visit.  Review of Systems     Cardiac Risk Factors include: advanced age (>65mn, >>72women);male gender;hypertension;dyslipidemia     Objective:    Today's Vitals   07/12/22 1024  BP: 124/62  Weight: 195 lb (88.5 kg)  Height: '5\' 9"'$  (1.753 m)   Body mass index is 28.8 kg/m.     07/12/2022    1:43 PM 08/09/2021    3:33 PM 03/24/2021   12:51 PM 01/31/2020    3:23 PM 07/03/2018    7:34 AM 12/18/2015    2:40 PM 05/14/2015   11:50 AM  Advanced Directives  Does Patient Have a Medical Advance Directive? No No No No No No No  Would patient like information on creating a medical advance directive? Yes (MAU/Ambulatory/Procedural Areas - Information given)    Yes (MAU/Ambulatory/Procedural Areas - Information given) No - patient declined information No - patient declined information    Current Medications (verified) Outpatient Encounter Medications as of 07/12/2022  Medication Sig   aspirin EC 81 MG tablet Take 81 mg by mouth daily. Swallow whole.   escitalopram (LEXAPRO) 20 MG tablet Take 1 tablet (20 mg total) by mouth daily.   gabapentin (NEURONTIN) 100 MG capsule Take 100 mg by mouth 2 (two) times daily.   lisinopril-hydrochlorothiazide (PRINZIDE,ZESTORETIC) 10-12.5 MG tablet Take 1 tablet by mouth daily.   rosuvastatin (CRESTOR) 10 MG tablet Take 1 tablet (10 mg total) by mouth daily.   tadalafil (CIALIS) 5 MG tablet Take 5 mg by mouth daily as needed for erectile dysfunction.   traZODone (DESYREL) 50 MG tablet Take 1 tablet (50 mg total) by mouth at bedtime.   [DISCONTINUED] hydrochlorothiazide (HYDRODIURIL) 12.5 MG tablet Take 12.5 mg by mouth daily.   No facility-administered encounter medications on file as of 07/12/2022.    Allergies (verified) Patient has no known allergies.   History: Past Medical History:  Diagnosis Date   Dental crowns present    Depression     Enlarged prostate    Hypertension    states under control with med., has been on med. x 2 yr.   Inguinal hernia 12/2015   Osteoarthritis    Past Surgical History:  Procedure Laterality Date   APPENDECTOMY  yrs ago   C[place]N/A 07/03/2018   Procedure: CYSTOSCOPY WITH INSERTION OF UROLIFT;  Surgeon: EFestus Aloe MD;  [facility]  Service: Urology;  Laterality: N/A;  ONLY NEEDS 30 MIN FOR PROCEDURE   INGUINAL HERNIA REPAIR  02/16/2012   Procedure: HERNIA REPAIR INGUINAL ADULT;  Surgeon: DHarl Bowie MD;  [facility]  Service: General;  Laterality: Left;  left inguinal hernia repair with mesh   INGUINAL HERNIA REPAIR Bilateral 12/23/2015   Procedure: LAPAROSCOPIC LEFT INGUINAL HERNIA WITH MESH;  Surgeon: DCoralie Keens MD;  [place] [facility]  Service: General;  Laterality: Bilateral;   INSERTION OF MESH Bilateral 12/23/2015   Procedure: INSERTION OF MESH;  Surgeon: DCoralie Keens MD;  [place] [facility]  Service: General;  Laterality: Bilateral;   KNEE ARTHROSCOPY WITH MEDIAL MENISECTOMY Right 10/18/2013   Procedure: RIGHT KNEE ARTHROSCOPY WITH PARTIAL MEDIAL MENISECTOMY;  Surgeon: JJohnn Hai MD;  [facility];  Service: Orthopedics;  Laterality: Right;   SHOULDER ARTHROSCOPY WITH ROTATOR CUFF REPAIR Left 2001   TOTAL KNEE ARTHROPLASTY Right 05/14/2015   Procedure: REMOVAL  OF HARDWARE AND RIGHT TOTAL KNEE ARTHROPLASTY;  Surgeon: Susa Day, MD;  [facility];  Service: Orthopedics;  Laterality: Right;   ULNAR NERVE REPAIR Right 1996   History reviewed. No pertinent family history. Social History   Socioeconomic History   Marital status: Married    Spouse name: Not on file   Number of children: Not on file   Years of education: Not on file   Highest education level: Not on file  Occupational History   Occupation: retired  Tobacco Use    Smoking status: Never   Smokeless tobacco: Never  Vaping Use   Vaping Use: Never used  Substance and Sexual Activity   Alcohol use: No   Drug use: No   Sexual activity: Not on file  Other Topics Concern   Not on file  Social History Narrative   Right handed   Drinks caffeine   Two story home   Married   Retired from working for [employer] Strain: [place]  (07/12/2022)   Overall Financial Resource Strain (CARDIA)    Difficulty of Paying Living Expenses: Not hard at all  Food Insecurity: No [place] (07/12/2022)   Hunger Vital Sign    Worried About Running Out of Food in the Last Year: Never true    [place] in the Last Year: Never true  Transportation Needs: No Transportation Needs (07/12/2022)   PRAPARE - Hydrologist (Medical): No    Lack of Transportation (Non-Medical): No  Physical Activity: Sufficiently Active (07/12/2022)   Exercise Vital Sign    Days of Exercise per Week: 5 days    Minutes of Exercise per Session: 30 min  Stress: No Stress Concern Present (07/12/2022)   [place]    Feeling of Stress : Not at all  Social Connections: [place] (07/12/2022)   Social Connection and Isolation Panel [NHANES]    Frequency of Communication with Friends and Family: More than three times a week    Frequency of Social Gatherings with Friends and Family: Three times a week    Attends Religious Services: More than 4 times per year    Active Member of Clubs or Organizations: Yes    Attends Archivist Meetings: 1 to 4 times per year    Marital Status: Married    Tobacco Counseling Counseling given: Not Answered   Clinical Intake:  Pre-visit preparation completed: Yes  Pain : No/denies pain  Diabetes: No  How often do you need to have someone help you when you read  instructions, pamphlets, or other written materials from your doctor or pharmacy?: 1 - Never  Diabetic?No   Interpreter Needed?: No  Information entered by :: Denman George LPN   Activities of Daily Living    07/12/2022    1:43 PM  In your present state of health, do you have any difficulty performing the following activities:  Hearing? 0  Vision? 0  Difficulty concentrating or making decisions? 0  Walking or climbing stairs? 0  Dressing or bathing? 0  Doing errands, shopping? 0  Preparing Food and eating ? N  Using the Toilet? N  In the past six months, have you accidently leaked urine? N  Do you have problems with loss of bowel control? N  Managing your Medications? N  Managing your Finances? N  Housekeeping or managing your Housekeeping?  N    Patient Care Team: Rubie Maid, FNP as PCP - General (Family Medicine) Cameron Sprang, MD as Consulting Physician (Neurology) [facility], [facility]  Indicate any recent Medical Services you may have received from other than [facility] providers in the past year (date may be approximate).     Assessment:   This is a routine wellness examination for Nicholas Cooke.  Hearing/Vision screen Hearing Screening - Comments:: Denies hearing difficulties  Vision Screening - Comments:: Wears rx glasses - up to date with routine eye exams with [facility]    Dietary issues and exercise activities discussed: Current Exercise Habits: Home exercise routine, Type of exercise: walking, Time (Minutes): 30, Frequency (Times/Week): 5, Weekly Exercise (Minutes/Week): 150, Intensity: Mild   Goals Addressed             This Visit's Progress    Remain active and independent        Depression Screen    07/12/2022    1:42 PM 05/17/2022    9:49 AM  PHQ 2/9 Scores  PHQ - 2 Score 0 3  PHQ- 9 Score  5    Fall Risk    07/12/2022    1:38 PM 05/17/2022    9:49 AM 08/09/2021    3:33 PM 03/24/2021   12:50 PM  Fall Risk   Falls in the past  year? 0 0 0 0  Number falls in past yr: 0  0 0  Injury with Fall? 0  0 0  Follow up Falls prevention discussed;Education provided;Falls evaluation completed       FALL RISK PREVENTION PERTAINING TO THE HOME:  Any stairs in or around the home? Yes  If so, are there any without handrails? No  Home free of loose throw rugs in walkways, pet beds, electrical cords, etc? Yes  Adequate lighting in your home to reduce risk of falls? Yes   ASSISTIVE DEVICES UTILIZED TO PREVENT FALLS:  Life alert? No  Use of a cane, walker or w/c? No  Grab bars in the bathroom? Yes  Shower chair or bench in shower? No  Elevated toilet seat or a handicapped toilet? Yes   TIMED UP AND GO:  Was the test performed? Yes .  Length of time to ambulate 10 feet: 5 sec.   Gait steady and fast without use of assistive device  Cognitive Function:        07/12/2022    1:44 PM  6CIT Screen  What Year? 0 points  What month? 0 points  What time? 0 points  Count back from 20 0 points  Months in reverse 0 points  Repeat phrase 0 points  Total Score 0 points    Immunizations Immunization History  Administered Date(s) Administered   Covid-19, Mrna,Vaccine(Spikevax)44yr and older 04/18/2022   Hepatitis A, Ped/Adol-2 Dose 12/25/2021, 06/25/2022   Hepatitis B, PED/ADOLESCENT 12/25/2021, 06/25/2022   Influenza, High Dose Seasonal PF 02/28/2022   PNEUMOCOCCAL CONJUGATE-20 06/25/2022   RSV,unspecified 03/29/2022   Tdap 12/25/2021   Zoster Recombinat (Shingrix) 12/25/2021, 06/25/2022    TDAP status: Up to date  Flu Vaccine status: Up to date  Pneumococcal vaccine status: Up to date  Covid-19 vaccine status: Information provided on how to obtain vaccines.   Qualifies for Shingles Vaccine? Yes   Zostavax completed No   Shingrix Completed?: Yes  Screening Tests Health Maintenance  Topic Date Due   Hepatitis C Screening  05/18/2023 (Originally 04/13/1969)   Medicare Annual Wellness (AWV)  07/13/2023    COLONOSCOPY (  Pts 45-61yr Insurance coverage will need to be confirmed)  05/15/2031   Pneumonia Vaccine 70 Years old  Completed   INFLUENZA VACCINE  Completed   COVID-19 Vaccine  Completed   Zoster Vaccines- Shingrix  Completed   HPV VACCINES  Aged Out   DTaP/Tdap/Td  Discontinued    Health Maintenance  There are no preventive care reminders to display for this patient.   Colorectal cancer screening: Type of screening: Colonoscopy. Completed 05/14/21. Repeat every 10 years  Lung Cancer Screening: (Low Dose CT Chest recommended if Age 388-80years, 30 pack-year currently smoking OR have quit w/in 15years.) does not qualify.   Lung Cancer Screening Referral: n/a  Additional Screening:  Hepatitis C Screening: does qualify; Completed at next office visit   Vision Screening: Recommended annual ophthalmology exams for early detection of glaucoma and other disorders of the eye. Is the patient up to date with their annual eye exam?  No  Who is the provider or what is the name of the office in which the patient attends annual eye exams? [facility]  If pt is not established with a provider, would they like to be referred to a provider to establish care? No .   Dental Screening: Recommended annual dental exams for proper oral hygiene  Community Resource Referral / Chronic Care Management: CRR required this visit?  No   CCM required this visit?  No      Plan:     I have personally reviewed and noted the following in the patient's chart:   Medical and social history Use of alcohol, tobacco or illicit drugs  Current medications and supplements including opioid prescriptions. Patient is not currently taking opioid prescriptions. Functional ability and status Nutritional status Physical activity Advanced directives List of other physicians Hospitalizations, surgeries, and ER visits in previous 12 months Vitals Screenings to include cognitive, depression, and falls Referrals  and appointments  In addition, I have reviewed and discussed with patient certain preventive protocols, quality metrics, and best practice recommendations. A written personalized care plan for preventive services as well as general preventive health recommendations were provided to patient.     SDenman GeorgeBUrbana [place]  [id number]  Nurse Notes: No concerns

## 2022-07-13 ENCOUNTER — Other Ambulatory Visit: Payer: Self-pay | Admitting: Family Medicine

## 2022-07-15 ENCOUNTER — Telehealth: Payer: Self-pay | Admitting: Family Medicine

## 2022-07-15 NOTE — Telephone Encounter (Signed)
Patient called to request new scripts be sent to his pharmacy for a 90 day supply of ALL of his meds; stated that's how they were filled before and he doesn't know why he's only been receiving a 30 day supply.   Pharmacy confirmed as:  [place], [place] [address] [place] [address] Phone: [phone]  Fax: [phone]   Please advise patient at [phone]

## 2022-07-18 NOTE — Telephone Encounter (Signed)
Pt's Rx by FNP all has been sent to [facility] per pt request.

## 2022-07-22 ENCOUNTER — Ambulatory Visit (INDEPENDENT_AMBULATORY_CARE_PROVIDER_SITE_OTHER): Payer: Medicare Other | Admitting: Family Medicine

## 2022-07-22 ENCOUNTER — Encounter: Payer: Self-pay | Admitting: Family Medicine

## 2022-07-22 VITALS — BP 118/76 | HR 75 | Ht 69.0 in | Wt 193.0 lb

## 2022-07-22 DIAGNOSIS — Q828 Other specified congenital malformations of skin: Secondary | ICD-10-CM

## 2022-07-22 DIAGNOSIS — D485 Neoplasm of uncertain behavior of skin: Secondary | ICD-10-CM | POA: Diagnosis not present

## 2022-07-22 NOTE — Progress Notes (Signed)
Subjective:    Patient ID: Nicholas Cooke, male    DOB: 29-Jun-1950, 72 y.o.   MRN: [id number]  HPI  Patient presents today with a painful lesion over the lateral aspect of his left foot.  It is just below the left fifth MTP joint.  He states it hurts like he is walking on a blister.  On inspection there is a hyperkeratotic consistent with porokeratosis.  He states it feels like he is walking on a rock.   Past Medical History:  Diagnosis Date   Dental crowns present    Depression    Enlarged prostate    Hypertension    states under control with med., has been on med. x 2 yr.   Inguinal hernia 12/2015   Osteoarthritis    Past Surgical History:  Procedure Laterality Date   APPENDECTOMY  yrs ago   [place] N/A 07/03/2018   Procedure: CYSTOSCOPY WITH INSERTION OF UROLIFT;  Surgeon: Festus Aloe, MD;  [facility];  Service: Urology;  Laterality: N/A;  ONLY NEEDS 30 MIN FOR PROCEDURE   INGUINAL HERNIA REPAIR  02/16/2012   Procedure: HERNIA REPAIR INGUINAL ADULT;  Surgeon: Harl Bowie, MD;  Location: [place];  Service: General;  Laterality: Left;  left inguinal hernia repair with mesh   INGUINAL HERNIA REPAIR Bilateral 12/23/2015   Procedure: LAPAROSCOPIC LEFT INGUINAL HERNIA WITH MESH;  Surgeon: Coralie Keens, MD;  Location: [place];  Service: General;  Laterality: Bilateral;   INSERTION OF MESH Bilateral 12/23/2015   Procedure: INSERTION OF MESH;  Surgeon: Coralie Keens, MD;  Location: [place];  Service: General;  Laterality: Bilateral;   KNEE ARTHROSCOPY WITH MEDIAL MENISECTOMY Right 10/18/2013   Procedure: RIGHT KNEE ARTHROSCOPY WITH PARTIAL MEDIAL MENISECTOMY;  Surgeon: Johnn Hai, MD;  [facility];  Service: Orthopedics;  Laterality: Right;   SHOULDER ARTHROSCOPY WITH ROTATOR CUFF REPAIR Left 2001   TOTAL KNEE ARTHROPLASTY Right 05/14/2015   Procedure: REMOVAL  OF HARDWARE AND RIGHT TOTAL KNEE ARTHROPLASTY;  Surgeon: Susa Day, MD;  [facility];  Service: Orthopedics;  Laterality: Right;   ULNAR NERVE REPAIR Right 1996   Current Outpatient Medications on File Prior to Visit  Medication Sig Dispense Refill   aspirin EC 81 MG tablet Take 81 mg by mouth daily. Swallow whole.     escitalopram (LEXAPRO) 20 MG tablet Take 1 tablet (20 mg total) by mouth daily. 30 tablet 3   gabapentin (NEURONTIN) 100 MG capsule Take 100 mg by mouth 2 (two) times daily.     lisinopril-hydrochlorothiazide (PRINZIDE,ZESTORETIC) 10-12.5 MG tablet Take 1 tablet by mouth daily.     rosuvastatin (CRESTOR) 10 MG tablet Take 1 tablet (10 mg total) by mouth daily. 90 tablet 3   tadalafil (CIALIS) 5 MG tablet Take 5 mg by mouth daily as needed for erectile dysfunction.     traZODone (DESYREL) 50 MG tablet TAKE 1 TABLET BY MOUTH AT  BEDTIME 30 tablet 11   No current facility-administered medications on file prior to visit.   No Known Allergies Social History   Socioeconomic History   Marital status: Married    Spouse name: Not on file   Number of children: Not on file   Years of education: Not on file   Highest education level: Not on file  Occupational History   Occupation: retired  Tobacco Use   Smoking status: Never   Smokeless tobacco: Never  Vaping Use  Vaping Use: Never used  Substance and Sexual Activity   Alcohol use: No   Drug use: No   Sexual activity: Not on file  Other Topics Concern   Not on file  Social History Narrative   Right handed   Drinks caffeine   Two story home   Married   Retired from working for [employer] Strain: [place]  (07/12/2022)   Overall Financial Resource Strain (CARDIA)    Difficulty of Paying Living Expenses: Not hard at all  Food Insecurity: No Food Insecurity (07/12/2022)   Hunger Vital Sign    Worried About Running Out of Food in the Last  Year: Never true    Ran Out of Food in the Last Year: Never true  Transportation Needs: No Transportation Needs (07/12/2022)   PRAPARE - Hydrologist (Medical): No    Lack of Transportation (Non-Medical): No  Physical Activity: Sufficiently Active (07/12/2022)   Exercise Vital Sign    Days of Exercise per Week: 5 days    Minutes of Exercise per Session: 30 min  Stress: No Stress Concern Present (07/12/2022)   [place]    Feeling of Stress : Not at all  Social Connections: [place] (07/12/2022)   Social Connection and Isolation Panel [NHANES]    Frequency of Communication with Friends and Family: More than three times a week    Frequency of Social Gatherings with Friends and Family: Three times a week    Attends Religious Services: More than 4 times per year    Active Member of Clubs or Organizations: Yes    Attends Archivist Meetings: 1 to 4 times per year    Marital Status: Married  Human resources officer Violence: Not At Risk (07/12/2022)   Humiliation, Afraid, Rape, and Kick questionnaire    Fear of Current or Ex-Partner: No    Emotionally Abused: No    Physically Abused: No    Sexually Abused: No     Review of Systems  All other systems reviewed and are negative.      Objective:   Physical Exam Constitutional:      Appearance: Normal appearance. He is normal weight.  Cardiovascular:     Rate and Rhythm: Normal rate and regular rhythm.     Heart sounds: Normal heart sounds.  Pulmonary:     Effort: Pulmonary effort is normal.     Breath sounds: Normal breath sounds.  Musculoskeletal:       Feet:  Neurological:     Mental Status: He is alert.           Assessment & Plan:  Porokeratosis Using a razor blade and a punch biopsy, I removed the hyperkeratotic tissue from the plantar surface of his foot down to underlying healthy dermal tissue.  There were no  capillary hemorrhages to suggest a plantars wart.  There was no bleeding or complication.  The patient tolerated the procedure well.  If the lesion returns I would recommend a referral to podiatry

## 2022-07-27 ENCOUNTER — Telehealth: Payer: Self-pay | Admitting: Family Medicine

## 2022-07-27 ENCOUNTER — Other Ambulatory Visit: Payer: Self-pay | Admitting: Family Medicine

## 2022-07-27 MED ORDER — TRAZODONE HCL 100 MG PO TABS: 100.0000 mg | ORAL_TABLET | Freq: Every day | ORAL | 3 refills | Status: DC

## 2022-07-27 NOTE — Telephone Encounter (Signed)
Patient called to follow up on recent refill he received for traZODone (DESYREL) 50 MG tablet [id number]   Patient stated the script should be 2 tablets at night, not just one. Patient also stated he's no longer taking the melatonin in conjunction with it which should allow for taking 2 tablets at night. Requesting new script.  CB # is [phone].  Please advise.

## 2022-08-06 ENCOUNTER — Other Ambulatory Visit: Payer: Self-pay | Admitting: Family Medicine

## 2022-08-24 ENCOUNTER — Telehealth: Payer: Self-pay | Admitting: Family Medicine

## 2022-08-24 NOTE — Telephone Encounter (Signed)
Prescription Request  08/24/2022  LOV: 05/17/2022  What is the name of the medication or equipment?   escitalopram (LEXAPRO) 20 MG tablet [[id number]]  **Patient stated PA needs to be completed; almost out of medication**  Have you contacted your pharmacy to request a refill? Yes   Which pharmacy would you like this sent to?  [facility] - [place], [place] - [address] [place] [address] Phone: [phone] Fax: [phone]    Patient notified that their request is being sent to the clinical staff for review and that they should receive a response within 2 business days.   Please advise patient when refill sent to [facility] at [phone].

## 2022-08-29 ENCOUNTER — Telehealth: Payer: Self-pay

## 2022-08-29 DIAGNOSIS — Q828 Other specified congenital malformations of skin: Secondary | ICD-10-CM

## 2022-08-29 NOTE — Telephone Encounter (Signed)
Pt called in to ask for a referral to a foot doctor. Please advise.  Cb#: [phone]

## 2022-08-29 NOTE — Telephone Encounter (Signed)
PA-Lexapro sent to plan. Waiting for insurance

## 2022-08-29 NOTE — Telephone Encounter (Signed)
Spoke w/pt reg. Referral to podiatry being sent today. Advise pt to call back on Thursday if he haven't heard anything by then. Pt voiced understanding.

## 2022-09-08 ENCOUNTER — Encounter: Payer: Self-pay | Admitting: Podiatry

## 2022-09-08 ENCOUNTER — Other Ambulatory Visit: Payer: Self-pay | Admitting: Family Medicine

## 2022-09-08 ENCOUNTER — Ambulatory Visit (INDEPENDENT_AMBULATORY_CARE_PROVIDER_SITE_OTHER): Payer: Medicare Other | Admitting: Podiatry

## 2022-09-08 DIAGNOSIS — D2372 Other benign neoplasm of skin of left lower limb, including hip: Secondary | ICD-10-CM | POA: Diagnosis not present

## 2022-09-08 DIAGNOSIS — M216X2 Other acquired deformities of left foot: Secondary | ICD-10-CM | POA: Diagnosis not present

## 2022-09-08 NOTE — Patient Instructions (Signed)
Take dressing off in 8 hours and wash the foot with soap and water. If it is hurting or becomes uncomfortable before the 8 hours, go ahead and remove the bandage and wash the area.  If it blisters, apply antibiotic ointment and a band-aid.  Monitor for any signs/symptoms of infection. Call the office immediately if any occur or go directly to the emergency room. Call with any questions/concerns.   

## 2022-09-08 NOTE — Progress Notes (Signed)
Subjective:   Patient ID: Nicholas Cooke, male   DOB: 72 y.o.   MRN: [id number]   HPI Chief Complaint  Patient presents with   Foot Pain    Sub 5th MPJ left - small, callused area x few months, PCP cut some of it out, also been using wart medication which has made it tender, skin peeling around it   New Patient (Initial Visit)    72 year old male presents the office for above concerns.  It started about 2 months ago. No injuries. No drainage or bleeding.  He has tried over-the-counter removal pads without significant improvement.  There is still tender with pressure.  Review of Systems  All other systems reviewed and are negative.    Past Medical History:  Diagnosis Date   Dental crowns present    Depression    Enlarged prostate    Hypertension    states under control with med., has been on med. x 2 yr.   Inguinal hernia 12/2015   Osteoarthritis     Past Surgical History:  Procedure Laterality Date   APPENDECTOMY  yrs ago   CYSTOSCOPY WITH INSERTION OF UROLIFT N/A 07/03/2018   Procedure: CYSTOSCOPY WITH INSERTION OF UROLIFT;  Surgeon: Jerilee Field, MD;  Location: [facility];  Service: Urology;  Laterality: N/A;  ONLY NEEDS 30 MIN FOR PROCEDURE   INGUINAL HERNIA REPAIR  02/16/2012   Procedure: HERNIA REPAIR INGUINAL ADULT;  Surgeon: Shelly Rubenstein, MD;  Location: [facility];  Service: General;  Laterality: Left;  left inguinal hernia repair with mesh   INGUINAL HERNIA REPAIR Bilateral 12/23/2015   Procedure: LAPAROSCOPIC LEFT INGUINAL HERNIA WITH MESH;  Surgeon: Abigail Miyamoto, MD;  Location: [facility];  Service: General;  Laterality: Bilateral;   INSERTION OF MESH Bilateral 12/23/2015   Procedure: INSERTION OF MESH;  Surgeon: Abigail Miyamoto, MD;  Location: [facility];  Service: General;  Laterality: Bilateral;   KNEE ARTHROSCOPY WITH MEDIAL MENISECTOMY Right 10/18/2013   Procedure: RIGHT KNEE ARTHROSCOPY WITH  PARTIAL MEDIAL MENISECTOMY;  Surgeon: Javier Docker, MD;  Location: [facility];  Service: Orthopedics;  Laterality: Right;   SHOULDER ARTHROSCOPY WITH ROTATOR CUFF REPAIR Left 2001   TOTAL KNEE ARTHROPLASTY Right 05/14/2015   Procedure: REMOVAL OF HARDWARE AND RIGHT TOTAL KNEE ARTHROPLASTY;  Surgeon: Jene Every, MD;  Location: [facility];  Service: Orthopedics;  Laterality: Right;   ULNAR NERVE REPAIR Right 1996     Current Outpatient Medications:    aspirin EC 81 MG tablet, Take 81 mg by mouth daily. Swallow whole., Disp: , Rfl:    escitalopram (LEXAPRO) 20 MG tablet, TAKE 1 TABLET BY MOUTH DAILY, Disp: 30 tablet, Rfl: 11   gabapentin (NEURONTIN) 100 MG capsule, Take 100 mg by mouth 2 (two) times daily., Disp: , Rfl:    lisinopril-hydrochlorothiazide (PRINZIDE,ZESTORETIC) 10-12.5 MG tablet, Take 1 tablet by mouth daily., Disp: , Rfl:    rosuvastatin (CRESTOR) 10 MG tablet, Take 1 tablet (10 mg total) by mouth daily., Disp: 90 tablet, Rfl: 3   tadalafil (CIALIS) 5 MG tablet, Take 5 mg by mouth daily as needed for erectile dysfunction., Disp: , Rfl:    traZODone (DESYREL) 100 MG tablet, Take 1 tablet (100 mg total) by mouth at bedtime., Disp: 90 tablet, Rfl: 3  No Known Allergies       Objective:  Physical Exam  General: AAO x3, NAD  Dermatological: Left submetatarsal 5 hyperkeratotic lesion with capillary budding evidence of verruca upon debridement.  There is no ongoing ulceration drainage or signs of infection.  There is peeling skin present on the periphery.  There is no signs of infection.  Vascular: Dorsalis Pedis artery and Posterior Tibial artery pedal pulses are 2/4 bilateral with immedate capillary fill time.  There is no pain with calf compression, swelling, warmth, erythema.   Neruologic: Grossly intact via light touch bilateral.   Musculoskeletal: Tenderness on the skin lesion but no other areas of discomfort.  Prominent tarsal head plantarly.  Gait: Unassisted,  Nonantalgic.       Assessment:   Skin lesion left foot     Plan:  -Treatment options discussed including all alternatives, risks, and complications -Etiology of symptoms were discussed -Sharply debrided lesion without any complications or bleeding.  I cleaned the skin with alcohol.  Cantharone Plus was applied followed by an occlusive bandage.  Postprocedure instructions discussed.  Tolerated well. Monitor for any clinical signs or symptoms of infection and directed to call the office immediately should any occur or go to the ER. -Offloading to the prominent metatarsal head.  Return if symptoms worsen or fail to improve.  Vivi Barrack DPM

## 2022-09-14 ENCOUNTER — Other Ambulatory Visit: Payer: Self-pay

## 2022-09-14 NOTE — Telephone Encounter (Signed)
Prescription Request  09/14/2022  LOV: 05/17/22  What is the name of the medication or equipment? lisinopril-hydrochlorothiazide (PRINZIDE,ZESTORETIC) 10-12.5 MG tablet [[id number]]  Have you contacted your pharmacy to request a refill? Yes   Which pharmacy would you like this sent to?  [facility] - [place], [place] - [address] [place] [address] Phone: [phone] Fax: [phone]    Patient notified that their request is being sent to the clinical staff for review and that they should receive a response within 2 business days.   Please advise at [facility] [phone]

## 2022-09-15 ENCOUNTER — Other Ambulatory Visit: Payer: Self-pay | Admitting: Family Medicine

## 2022-09-15 MED ORDER — LISINOPRIL-HYDROCHLOROTHIAZIDE 10-12.5 MG PO TABS: 1.0000 | ORAL_TABLET | Freq: Every day | ORAL | 0 refills | Status: DC

## 2022-09-15 NOTE — Telephone Encounter (Signed)
Requested medication (s) are due for refill today:   Not sure  Requested medication (s) are on the active medication list:   Yes as historical  Future visit scheduled:   Yes with Amber in 2 mo.   Last ordered: 07/03/2018 from [facility]  Returned for provider's review.   Requested Prescriptions  Pending Prescriptions Disp Refills   lisinopril-hydrochlorothiazide (ZESTORETIC) 10-12.5 MG tablet      Sig: Take 1 tablet by mouth daily.     Cardiovascular:  ACEI + Diuretic Combos Failed - 09/14/2022  1:59 PM      Failed - Cr in normal range and within 180 days    Creat  Date Value Ref Range Status  06/30/2022 1.59 (H) 0.70 - 1.35 mg/dL Final         Failed - Valid encounter within last 6 months    Recent Outpatient Visits   None     Future Appointments             In 2 months Park Meo, FNP [place] [facility] [facility]            Passed - Na in normal range and within 180 days    Sodium  Date Value Ref Range Status  06/30/2022 138 135 - 146 mmol/L Final         Passed - K in normal range and within 180 days    Potassium  Date Value Ref Range Status  06/30/2022 3.8 3.5 - 5.3 mmol/L Final         Passed - eGFR is 30 or above and within 180 days    GFR calc Af Amer  Date Value Ref Range Status  01/31/2020 53 (L) >60 mL/min Final   GFR calc non Af Amer  Date Value Ref Range Status  01/31/2020 46 (L) >60 mL/min Final   eGFR  Date Value Ref Range Status  06/30/2022 47 (L) > OR = 60 mL/min/1.37m2 Final         Passed - Patient is not pregnant      Passed - Last BP in normal range    BP Readings from Last 1 Encounters:  07/22/22 118/76

## 2022-10-03 ENCOUNTER — Other Ambulatory Visit: Payer: Self-pay | Admitting: Family Medicine

## 2022-10-03 NOTE — Telephone Encounter (Signed)
Prescription Request  10/03/2022  LOV: 05/17/2022  What is the name of the medication or equipment?   lisinopril-hydrochlorothiazide (ZESTORETIC) 10-12.5 MG tablet   Have you contacted your pharmacy to request a refill? Yes   Which pharmacy would you like this sent to?  [facility] - [place], [place] - [address] [place] [address] Phone: [phone] Fax: [phone]    Patient notified that their request is being sent to the clinical staff for review and that they should receive a response within 2 business days.   Please advise pharmacist.

## 2022-10-04 MED ORDER — LISINOPRIL-HYDROCHLOROTHIAZIDE 10-12.5 MG PO TABS: 1.0000 | ORAL_TABLET | Freq: Every day | ORAL | 0 refills | Status: DC

## 2022-10-04 NOTE — Telephone Encounter (Signed)
Requested Prescriptions  Pending Prescriptions Disp Refills   lisinopril-hydrochlorothiazide (ZESTORETIC) 10-12.5 MG tablet 90 tablet 0    Sig: Take 1 tablet by mouth daily.     Cardiovascular:  ACEI + Diuretic Combos Failed - 10/03/2022 12:41 PM      Failed - Cr in normal range and within 180 days    Creat  Date Value Ref Range Status  06/30/2022 1.59 (H) 0.70 - 1.35 mg/dL Final         Failed - Valid encounter within last 6 months    Recent Outpatient Visits   None     Future Appointments             In 1 month Park Meo, FNP [place] [facility]            Passed - Na in normal range and within 180 days    Sodium  Date Value Ref Range Status  06/30/2022 138 135 - 146 mmol/L Final         Passed - K in normal range and within 180 days    Potassium  Date Value Ref Range Status  06/30/2022 3.8 3.5 - 5.3 mmol/L Final         Passed - eGFR is 30 or above and within 180 days    GFR calc Af Amer  Date Value Ref Range Status  01/31/2020 53 (L) >60 mL/min Final   GFR calc non Af Amer  Date Value Ref Range Status  01/31/2020 46 (L) >60 mL/min Final   eGFR  Date Value Ref Range Status  06/30/2022 47 (L) > OR = 60 mL/min/1.26m2 Final         Passed - Patient is not pregnant      Passed - Last BP in normal range    BP Readings from Last 1 Encounters:  07/22/22 118/76

## 2022-10-12 ENCOUNTER — Other Ambulatory Visit: Payer: Self-pay | Admitting: Family Medicine

## 2022-11-06 ENCOUNTER — Other Ambulatory Visit: Payer: Self-pay | Admitting: Family Medicine

## 2022-11-07 ENCOUNTER — Ambulatory Visit (INDEPENDENT_AMBULATORY_CARE_PROVIDER_SITE_OTHER): Payer: Medicare Other

## 2022-11-07 ENCOUNTER — Encounter (HOSPITAL_COMMUNITY): Payer: Self-pay

## 2022-11-07 ENCOUNTER — Ambulatory Visit (HOSPITAL_COMMUNITY)
Admission: RE | Admit: 2022-11-07 | Discharge: 2022-11-07 | Disposition: A | Discharge status: Home / Self Care | Payer: Medicare Other | Source: Ambulatory Visit | Attending: Family Medicine | Admitting: Family Medicine

## 2022-11-07 ENCOUNTER — Other Ambulatory Visit: Payer: Self-pay

## 2022-11-07 VITALS — BP 117/76 | HR 86 | Temp 98.1°F | Resp 20

## 2022-11-07 DIAGNOSIS — M545 Low back pain, unspecified: Secondary | ICD-10-CM | POA: Diagnosis not present

## 2022-11-07 MED ORDER — PREDNISONE 20 MG PO TABS
40.0000 mg | ORAL_TABLET | Freq: Every day | ORAL | 0 refills | Status: AC
Start: 2022-11-07 — End: 2022-11-12

## 2022-11-07 MED ORDER — HYDROCODONE-ACETAMINOPHEN 5-325 MG PO TABS: 1.0000 | ORAL_TABLET | Freq: Four times a day (QID) | ORAL | 0 refills | Status: DC | PRN

## 2022-11-07 MED ORDER — GABAPENTIN 100 MG PO CAPS: 100.0000 mg | ORAL_CAPSULE | Freq: Two times a day (BID) | ORAL | 0 refills | Status: DC

## 2022-11-07 NOTE — Discharge Instructions (Addendum)
X-ray shows some degenerative changes.  Take prednisone 20 mg--2 daily for 5 days  Hydrocodone 5 mg--1 tablet every 6 hours as needed for pain.  This is best taken with food.  It can cause sleepiness or dizziness.  This is meant for short-term use only  Take gabapentin 100 mg--1 tablet 2 times daily.  Please follow-up with your primary care, so they can help you evaluate further if you are not improving.  They can also see if you need physical therapy or if you need dosage adjustments on your gabapentin.

## 2022-11-07 NOTE — ED Triage Notes (Signed)
Patient has pain in lower back.  Pain starts in middle and radiates to right and left of center spine.  Patient has a remote history of back pain and did do physical therapy.    Patient remembers stepping off an uneven step and feeling a quick jarring of back.    Yesterday sneezed and pain escalated and having a BM has proven painful  Patient is not taking any tylenol or ibuprofen and gabapentin script lapsed

## 2022-11-07 NOTE — Telephone Encounter (Signed)
Requested Prescriptions  Pending Prescriptions Disp Refills   traZODone (DESYREL) 50 MG tablet [Pharmacy Med Name: TRAZODONE 50 MG TABLET] 30 tablet 1    Sig: TAKE 1 TABLET BY MOUTH AT BEDTIME AS NEEDED FOR SLEEP     Psychiatry: Antidepressants - Serotonin Modulator Failed - 11/07/2022  4:22 PM      Failed - Valid encounter within last 6 months    Recent Outpatient Visits   None     Future Appointments             In 1 week Park Meo, FNP [place] [facility]            Passed - Completed PHQ-2 or PHQ-9 in the last 360 days

## 2022-11-07 NOTE — Telephone Encounter (Signed)
Prescription Request  11/07/2022  LOV: 05/17/2022  What is the name of the medication or equipment? gabapentin (NEURONTIN) 100 MG capsule [[id number]]  Have you contacted your pharmacy to request a refill? Yes   Which pharmacy would you like this sent to?    Patient notified that their request is being sent to the clinical staff for review and that they should receive a response within 2 business days.   Please advise at [facility] [phone]

## 2022-11-07 NOTE — ED Provider Notes (Signed)
[facility]    CSN: [id number] Arrival date & time: 11/07/22  1407      History   Chief Complaint Chief Complaint  Patient presents with   Appointment    14:30   Back Pain    HPI Nicholas Cooke is a 72 y.o. male.    Back Pain  Here with bilateral and central low back pain has been going on for about 2 weeks.  It started after he stepped off a curb and it was lower than the thought it was going to be and it jolted his back.  No fall or trauma.  No fever or rash and no dysuria or hematuria.  He has also not had any bowel or bladder incontinence.   Pain is not radiating down his legs.   He has had similar pain in the past.  It was helped in the long-term by gabapentin and physical therapy.  He does have primary care.  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  He does not have a history of diabetes    Past Medical History:  Diagnosis Date   Dental crowns present    Depression    Enlarged prostate    Hypertension    states under control with med., has been on med. x 2 yr.   Inguinal hernia 12/2015   Osteoarthritis     Patient Active Problem List   Diagnosis Date Noted   Benign prostatic hyperplasia without lower urinary tract symptoms 05/17/2022   Stage 3b chronic kidney disease (HCC) 05/17/2022   Diverticulosis 05/17/2022   Hyperlipidemia 05/17/2022   Depression, recurrent (HCC) 05/17/2022   Essential hypertension 06/15/2020   Bilateral impacted cerumen 03/15/2018   Rhinitis 03/15/2018   Tinnitus aurium, bilateral 03/15/2018   Osteoarthritis of right knee 05/14/2015   Right knee DJD 05/14/2015   Hemiplegia, unspecified, affecting nondominant side 06/13/2013   Left inguinal hernia 01/24/2012    Past Surgical History:  Procedure Laterality Date   APPENDECTOMY  yrs ago   CYSTOSCOPY WITH INSERTION OF UROLIFT N/A 07/03/2018   Procedure: CYSTOSCOPY WITH INSERTION OF UROLIFT;  Surgeon: Jerilee Field, MD;  Location: [facility];  Service: Urology;  Laterality: N/A;  ONLY NEEDS 30 MIN FOR PROCEDURE   INGUINAL HERNIA REPAIR  02/16/2012   Procedure: HERNIA REPAIR INGUINAL ADULT;  Surgeon: Shelly Rubenstein, MD;  Location: [facility];  Service: General;  Laterality: Left;  left inguinal hernia repair with mesh   INGUINAL HERNIA REPAIR Bilateral 12/23/2015   Procedure: LAPAROSCOPIC LEFT INGUINAL HERNIA WITH MESH;  Surgeon: Abigail Miyamoto, MD;  Location: [facility];  Service: General;  Laterality: Bilateral;   INSERTION OF MESH Bilateral 12/23/2015   Procedure: INSERTION OF MESH;  Surgeon: Abigail Miyamoto, MD;  Location: [facility];  Service: General;  Laterality: Bilateral;   KNEE ARTHROSCOPY WITH MEDIAL MENISECTOMY Right 10/18/2013   Procedure: RIGHT KNEE ARTHROSCOPY WITH PARTIAL MEDIAL MENISECTOMY;  Surgeon: Javier Docker, MD;  Location: [facility];  Service: Orthopedics;  Laterality: Right;   SHOULDER ARTHROSCOPY WITH ROTATOR CUFF REPAIR Left 2001   TOTAL KNEE ARTHROPLASTY Right 05/14/2015   Procedure: REMOVAL OF HARDWARE AND RIGHT TOTAL KNEE ARTHROPLASTY;  Surgeon: Jene Every, MD;  Location: [facility];  Service: Orthopedics;  Laterality: Right;   ULNAR NERVE REPAIR Right 1996       Home Medications    Prior to Admission medications   Medication Sig Start Date End Date Taking?  Authorizing Provider  HYDROcodone-acetaminophen (NORCO/VICODIN) 5-325 MG tablet Take 1 tablet by mouth every 6 (six) hours as needed (pain). 11/07/22  Yes Zenia Resides, MD  predniSONE (DELTASONE) 20 MG tablet Take 2 tablets (40 mg total) by mouth daily with breakfast for 5 days. 11/07/22 11/12/22 Yes Zenia Resides, MD  aspirin EC 81 MG tablet Take 81 mg by mouth daily. Swallow whole.    [provider]  escitalopram (LEXAPRO) 20 MG tablet TAKE 1 TABLET BY MOUTH DAILY 08/09/22   Park Meo, FNP  gabapentin (NEURONTIN) 100 MG capsule Take 1 capsule (100 mg total) by  mouth 2 (two) times daily. 11/07/22   Zenia Resides, MD  lisinopril-hydrochlorothiazide (ZESTORETIC) 10-12.5 MG tablet Take 1 tablet by mouth daily. 10/04/22   Park Meo, FNP  rosuvastatin (CRESTOR) 10 MG tablet Take 1 tablet (10 mg total) by mouth daily. 07/06/22   Park Meo, FNP  tadalafil (CIALIS) 5 MG tablet Take 5 mg by mouth daily as needed for erectile dysfunction.    [provider]  traZODone (DESYREL) 50 MG tablet TAKE 1 TABLET BY MOUTH EVERY DAY AT BEDTIME AS NEEDED FOR SLEEP 10/12/22   Park Meo, FNP    Family History History reviewed. No pertinent family history.  Social History Social History   Tobacco Use   Smoking status: Never   Smokeless tobacco: Never  Vaping Use   Vaping Use: Never used  Substance Use Topics   Alcohol use: No   Drug use: No     Allergies   Patient has no known allergies.   Review of Systems Review of Systems  Musculoskeletal:  Positive for back pain.     Physical Exam Triage Vital Signs ED Triage Vitals  Enc Vitals Group     BP 11/07/22 1454 117/76     Pulse Rate 11/07/22 1454 86     Resp 11/07/22 1454 20     Temp 11/07/22 1454 98.1 F (36.7 C)     Temp Source 11/07/22 1454 Oral     SpO2 11/07/22 1454 95 %     Weight --      Height --      Head Circumference --      Peak Flow --      Pain Score 11/07/22 1450 4     Pain Loc --      Pain Edu? --      Excl. in GC? --    No data found.  Updated Vital Signs BP 117/76 (BP Location: Left Arm)   Pulse 86   Temp 98.1 F (36.7 C) (Oral)   Resp 20   SpO2 95%   Visual Acuity Right Eye Distance:   Left Eye Distance:   Bilateral Distance:    Right Eye Near:   Left Eye Near:    Bilateral Near:     Physical Exam Vitals reviewed.  Constitutional:      General: He is not in acute distress.    Appearance: He is not toxic-appearing.     Comments: He is standing in the room while he talks to me due to pain increasing when he sits.  HENT:      Mouth/Throat:     Mouth: Mucous membranes are moist.  Eyes:     Extraocular Movements: Extraocular movements intact.     Conjunctiva/sclera: Conjunctivae normal.     Pupils: Pupils are equal, round, and reactive to light.  Cardiovascular:     Rate and Rhythm:  Normal rate and regular rhythm.     Heart sounds: No murmur heard. Pulmonary:     Effort: Pulmonary effort is normal. No respiratory distress.     Breath sounds: No stridor. No wheezing.  Musculoskeletal:     Cervical back: Neck supple.     Comments: Tender across LS area  Skin:    Coloration: Skin is not jaundiced or pale.  Neurological:     General: No focal deficit present.     Mental Status: He is alert and oriented to person, place, and time.  Psychiatric:        Behavior: Behavior normal.      UC Treatments / Results  Labs (all labs ordered are listed, but only abnormal results are displayed) Labs Reviewed - No data to display  EKG   Radiology DG Lumbar Spine 2-3 Views  Result Date: 11/07/2022 CLINICAL DATA:  Recent exacerbation of low back pain EXAM: LUMBAR SPINE - 3 VIEW COMPARISON:  Lumbar spine radiograph dated 11/01/2019 FINDINGS: There is no evidence of lumbar spine fracture. Alignment is normal. Intervertebral disc spaces are maintained. Lower lumbar facet arthropathy. Radiopaque surgical clips project over the midline pelvis. IMPRESSION: Lower lumbar facet arthropathy. No acute fracture or traumatic listhesis. Electronically Signed   By: Agustin Cree M.D.   On: 11/07/2022 15:46    Procedures Procedures (including critical care time)  Medications Ordered in UC Medications - No data to display  Initial Impression / Assessment and Plan / UC Course  I have reviewed the triage vital signs and the nursing notes.  Pertinent labs & imaging results that were available during my care of the patient were reviewed by me and considered in my medical decision making (see chart for details).        X-ray shows  some degenerative changes.  He is not giving any Toradol here with his reduced renal function.  5-day burst of prednisone is sent in as is 3-day supply of hydrocodone.  I am also starting him on low-dose gabapentin.  I have asked him to follow-up with his primary care. Final Clinical Impressions(s) / UC Diagnoses   Final diagnoses:  Acute bilateral low back pain without sciatica     Discharge Instructions      X-ray shows some degenerative changes.  Take prednisone 20 mg--2 daily for 5 days  Hydrocodone 5 mg--1 tablet every 6 hours as needed for pain.  This is best taken with food.  It can cause sleepiness or dizziness.  This is meant for short-term use only  Take gabapentin 100 mg--1 tablet 2 times daily.  Please follow-up with your primary care, so they can help you evaluate further if you are not improving.  They can also see if you need physical therapy or if you need dosage adjustments on your gabapentin.       ED Prescriptions     Medication Sig Dispense Auth. Provider   gabapentin (NEURONTIN) 100 MG capsule Take 1 capsule (100 mg total) by mouth 2 (two) times daily. 60 capsule Zenia Resides, MD   predniSONE (DELTASONE) 20 MG tablet Take 2 tablets (40 mg total) by mouth daily with breakfast for 5 days. 10 tablet Zenia Resides, MD   HYDROcodone-acetaminophen (NORCO/VICODIN) 5-325 MG tablet Take 1 tablet by mouth every 6 (six) hours as needed (pain). 12 tablet Arav Bannister, Janace Aris, MD      I have reviewed the PDMP during this encounter.   Zenia Resides, MD 11/07/22 [phone]

## 2022-11-14 ENCOUNTER — Telehealth: Payer: Self-pay | Admitting: Family Medicine

## 2022-11-14 NOTE — Telephone Encounter (Signed)
Prescription Request  11/14/2022  LOV: 05/17/2022  What is the name of the medication or equipment?   gabapentin (NEURONTIN) 100 MG capsule [[id number]]  **Patient's preferred pharmacy for this refill request is [facility] delivery (see below); recently sent to [employer].  Have you contacted your pharmacy to request a refill? Yes   Which pharmacy would you like this sent to?  [facility] - [place], [place] - [address] [place] [address] Phone: [phone] Fax: [phone]   Patient notified that their request is being sent to the clinical staff for review and that they should receive a response within 2 business days.   Please advise patient when refill sent at [phone]

## 2022-11-15 ENCOUNTER — Ambulatory Visit: Payer: Medicare Other | Admitting: Family Medicine

## 2022-11-16 MED ORDER — GABAPENTIN 100 MG PO CAPS: 100.0000 mg | ORAL_CAPSULE | Freq: Two times a day (BID) | ORAL | 0 refills | Status: DC

## 2022-11-24 ENCOUNTER — Ambulatory Visit (INDEPENDENT_AMBULATORY_CARE_PROVIDER_SITE_OTHER): Payer: Medicare Other | Admitting: Family Medicine

## 2022-11-24 ENCOUNTER — Encounter: Payer: Self-pay | Admitting: Family Medicine

## 2022-11-24 ENCOUNTER — Other Ambulatory Visit: Payer: Self-pay

## 2022-11-24 VITALS — BP 118/62 | HR 80 | Temp 98.2°F | Ht 69.0 in | Wt 188.4 lb

## 2022-11-24 DIAGNOSIS — M545 Low back pain, unspecified: Secondary | ICD-10-CM | POA: Diagnosis not present

## 2022-11-24 DIAGNOSIS — I10 Essential (primary) hypertension: Secondary | ICD-10-CM | POA: Diagnosis not present

## 2022-11-24 DIAGNOSIS — N1832 Chronic kidney disease, stage 3b: Secondary | ICD-10-CM | POA: Diagnosis not present

## 2022-11-24 DIAGNOSIS — E782 Mixed hyperlipidemia: Secondary | ICD-10-CM

## 2022-11-24 MED ORDER — HYDROCODONE-ACETAMINOPHEN 5-325 MG PO TABS: 1.0000 | ORAL_TABLET | Freq: Four times a day (QID) | ORAL | 0 refills | Status: DC | PRN

## 2022-11-24 NOTE — Assessment & Plan Note (Signed)
Continue crestor 10mg  daily. Fasting labs today. Recommend heart healthy diet and 150 minutes moderate intensity exercise weekly.

## 2022-11-24 NOTE — Assessment & Plan Note (Addendum)
Chronic. Well controlled. Goal <130/80. Continue Lisinopril-HCTZ 10-12.5mg  daily. Denies chest pain, SOB, palpitations, lightheadedness/dizziness and instructed to seek medical care for any of the above. Encourage heart healthy diet and 150 minutes moderate intensity exercise weekly. Fasting labs today.

## 2022-11-24 NOTE — Assessment & Plan Note (Signed)
Recent exacerbation after a misstep on 6/24, no fall or injury. Was evaluated at [facility], xray showed lower lumbar facet arthropathy. Pain was improved with prednisone and Norco, he was provided with 12 tablets and has run out, requests refill for acute pain relief, will provide refill today. This is a chronic issue for him and he has tried physical therapy and conservative management, not interested in surgical interventions. Referral placed today for [facility] for long term pain management options.

## 2022-11-24 NOTE — Telephone Encounter (Signed)
[facility] referral ordered and put up front to be fax 

## 2022-11-24 NOTE — Assessment & Plan Note (Signed)
Cr stable at baseline around 1.6, recent 1.82 on 04/18/2022. GFR 40. BP well controlled. Reports staying hydrated and avoiding NSAIDs. 

## 2022-11-24 NOTE — Progress Notes (Signed)
Subjective:  HPI: Nicholas Cooke is a 72 y.o. male presenting on 11/24/2022 for Follow-up (6 month follow up)   HPI Patient is in today for chronic conditions follow-up.  HYPERTENSION without Chronic Kidney Disease Hypertension status: controlled  Satisfied with current treatment? yes Duration of hypertension: chronic BP monitoring frequency:  a few times a week BP range: 112-120/66-78 BP medication side effects:  no Medication compliance: excellent compliance Previous BP meds:lisinopril-HCTZ Aspirin: yes Recurrent headaches: no Visual changes: no Palpitations: no Dyspnea: no Chest pain: no Lower extremity edema: no Dizzy/lightheaded: no   Review of Systems  All other systems reviewed and are negative.   Relevant past medical history reviewed and updated as indicated.   Past Medical History:  Diagnosis Date   Dental crowns present    Depression    Enlarged prostate    Hypertension    states under control with med., has been on med. x 2 yr.   Inguinal hernia 12/2015   Osteoarthritis      Past Surgical History:  Procedure Laterality Date   APPENDECTOMY  yrs ago   CYSTOSCOPY WITH INSERTION OF UROLIFT N/A 07/03/2018   Procedure: CYSTOSCOPY WITH INSERTION OF UROLIFT;  Surgeon: Jerilee Field, MD;  Location: [facility];  Service: Urology;  Laterality: N/A;  ONLY NEEDS 30 MIN FOR PROCEDURE   INGUINAL HERNIA REPAIR  02/16/2012   Procedure: HERNIA REPAIR INGUINAL ADULT;  Surgeon: Shelly Rubenstein, MD;  Location: [facility];  Service: General;  Laterality: Left;  left inguinal hernia repair with mesh   INGUINAL HERNIA REPAIR Bilateral 12/23/2015   Procedure: LAPAROSCOPIC LEFT INGUINAL HERNIA WITH MESH;  Surgeon: Abigail Miyamoto, MD;  Location: [facility];  Service: General;  Laterality: Bilateral;   INSERTION OF MESH Bilateral 12/23/2015   Procedure: INSERTION OF MESH;  Surgeon: Abigail Miyamoto, MD;  Location: [facility];  Service: General;  Laterality: Bilateral;   KNEE ARTHROSCOPY WITH MEDIAL MENISECTOMY Right 10/18/2013   Procedure: RIGHT KNEE ARTHROSCOPY WITH PARTIAL MEDIAL MENISECTOMY;  Surgeon: Javier Docker, MD;  Location: [facility];  Service: Orthopedics;  Laterality: Right;   SHOULDER ARTHROSCOPY WITH ROTATOR CUFF REPAIR Left 2001   TOTAL KNEE ARTHROPLASTY Right 05/14/2015   Procedure: REMOVAL OF HARDWARE AND RIGHT TOTAL KNEE ARTHROPLASTY;  Surgeon: Jene Every, MD;  Location: [facility];  Service: Orthopedics;  Laterality: Right;   ULNAR NERVE REPAIR Right 1996    Allergies and medications reviewed and updated.   Current Outpatient Medications:    aspirin EC 81 MG tablet, Take 81 mg by mouth daily. Swallow whole., Disp: , Rfl:    escitalopram (LEXAPRO) 20 MG tablet, TAKE 1 TABLET BY MOUTH DAILY, Disp: 30 tablet, Rfl: 11   gabapentin (NEURONTIN) 100 MG capsule, Take 1 capsule (100 mg total) by mouth 2 (two) times daily., Disp: 60 capsule, Rfl: 0   lisinopril-hydrochlorothiazide (ZESTORETIC) 10-12.5 MG tablet, Take 1 tablet by mouth daily., Disp: 90 tablet, Rfl: 0   rosuvastatin (CRESTOR) 10 MG tablet, Take 1 tablet (10 mg total) by mouth daily., Disp: 90 tablet, Rfl: 3   tadalafil (CIALIS) 5 MG tablet, Take 5 mg by mouth daily as needed for erectile dysfunction., Disp: , Rfl:    traZODone (DESYREL) 50 MG tablet, TAKE 1 TABLET BY MOUTH AT BEDTIME AS NEEDED FOR SLEEP, Disp: 30 tablet, Rfl: 1   HYDROcodone-acetaminophen (NORCO/VICODIN) 5-325 MG tablet, Take 1 tablet by mouth every 6 (six) hours as needed (pain)., Disp: 20 tablet, Rfl: 0  No Known Allergies  Objective:   BP 118/62   Pulse 80   Temp 98.2 F (36.8 C)   Ht 5\' 9"  (1.753 m)   Wt 188 lb 6.4 oz (85.5 kg)   SpO2 98%   BMI 27.82 kg/m      11/24/2022    8:25 AM 11/07/2022    2:54 PM 07/22/2022    1:50 PM  Vitals with BMI  Height 5\' 9"   5\' 9"   Weight 188 lbs 6 oz  193 lbs  BMI 27.81  28.49  Systolic 118 117 621  Diastolic  62 76 76  Pulse 80 86 75     Physical Exam Vitals and nursing note reviewed.  Constitutional:      Appearance: Normal appearance. He is normal weight.  HENT:     Head: Normocephalic and atraumatic.  Neck:     Vascular: No carotid bruit.  Cardiovascular:     Rate and Rhythm: Normal rate and regular rhythm.     Pulses: Normal pulses.     Heart sounds: Normal heart sounds.  Pulmonary:     Effort: Pulmonary effort is normal.     Breath sounds: Normal breath sounds.  Musculoskeletal:     Lumbar back: Tenderness and bony tenderness present.  Skin:    General: Skin is warm and dry.     Capillary Refill: Capillary refill takes less than 2 seconds.  Neurological:     General: No focal deficit present.     Mental Status: He is alert and oriented to person, place, and time. Mental status is at baseline.  Psychiatric:        Mood and Affect: Mood normal.        Behavior: Behavior normal.        Thought Content: Thought content normal.        Judgment: Judgment normal.     Assessment & Plan:  Essential hypertension Assessment & Plan: Chronic. Well controlled. Goal <130/80. Continue Lisinopril-HCTZ 10-12.5mg  daily. Denies chest pain, SOB, palpitations, lightheadedness/dizziness and instructed to seek medical care for any of the above. Encourage heart healthy diet and 150 minutes moderate intensity exercise weekly. Fasting labs today.  Orders: -     CBC with Differential/Platelet -     COMPLETE METABOLIC PANEL WITH GFR -     Lipid panel  Acute bilateral low back pain without sciatica Assessment & Plan: Recent exacerbation after a misstep on 6/24, no fall or injury. Was evaluated at [facility], xray showed lower lumbar facet arthropathy. Pain was improved with prednisone and Norco, he was provided with 12 tablets and has run out, requests refill for acute pain relief, will provide refill today. This is a chronic issue for him and he has tried physical therapy and conservative management, not  interested in surgical interventions. Referral placed today for [facility] for long term pain management options.    Stage 3b chronic kidney disease (HCC) Assessment & Plan: Cr stable at baseline around 1.6, recent 1.82 on 04/18/2022. GFR 40. BP well controlled. Reports staying hydrated and avoiding NSAIDs.  Orders: -     CBC with Differential/Platelet -     COMPLETE METABOLIC PANEL WITH GFR -     Lipid panel  Mixed hyperlipidemia Assessment & Plan: Continue crestor 10mg  daily. Fasting labs today. Recommend heart healthy diet and 150 minutes moderate intensity exercise weekly.  Orders: -     CBC with Differential/Platelet -     COMPLETE METABOLIC PANEL WITH GFR -  Lipid panel  Other orders -     HYDROcodone-Acetaminophen; Take 1 tablet by mouth every 6 (six) hours as needed (pain).  Dispense: 20 tablet; Refill: 0     Follow up plan: Return in about 6 months (around 05/27/2023) for annual physical.  Park Meo, FNP

## 2022-11-25 ENCOUNTER — Other Ambulatory Visit: Payer: Medicare Other

## 2022-11-25 LAB — CBC WITH DIFFERENTIAL/PLATELET
Absolute Monocytes: 932 cells/uL (ref 200–950)
Basophils Absolute: 34 cells/uL (ref 0–200)
Eosinophils Relative: 1 %
Hemoglobin: 15.7 g/dL (ref 13.2–17.1)
MCHC: 34 g/dL (ref 32.0–36.0)
MPV: 10.2 fL (ref 7.5–12.5)
Neutrophils Relative %: 40.9 %
Total Lymphocyte: 46.6 %

## 2022-11-26 LAB — COMPLETE METABOLIC PANEL WITH GFR
AG Ratio: 1.4 (calc) (ref 1.0–2.5)
ALT: 15 U/L (ref 9–46)
AST: 16 U/L (ref 10–35)
Albumin: 4 g/dL (ref 3.6–5.1)
Alkaline phosphatase (APISO): 62 U/L (ref 35–144)
BUN/Creatinine Ratio: 12 (calc) (ref 6–22)
BUN: 19 mg/dL (ref 7–25)
CO2: 25 mmol/L (ref 20–32)
Calcium: 8.6 mg/dL (ref 8.6–10.3)
Chloride: 99 mmol/L (ref 98–110)
Creat: 1.6 mg/dL — ABNORMAL HIGH (ref 0.70–1.35)
Globulin: 2.9 g/dL (calc) (ref 1.9–3.7)
Glucose, Bld: 99 mg/dL (ref 65–99)
Potassium: 3.8 mmol/L (ref 3.5–5.3)
Sodium: 134 mmol/L — ABNORMAL LOW (ref 135–146)
Total Bilirubin: 0.7 mg/dL (ref 0.2–1.2)
Total Protein: 6.9 g/dL (ref 6.1–8.1)
eGFR: 46 mL/min/{1.73_m2} — ABNORMAL LOW (ref >=60)

## 2022-11-26 LAB — CBC WITH DIFFERENTIAL/PLATELET
Basophils Relative: 0.4 %
Eosinophils Absolute: 84 cells/uL (ref 15–500)
HCT: 46.2 % (ref 38.5–50.0)
Lymphs Abs: 3914 cells/uL — ABNORMAL HIGH (ref 850–3900)
MCH: 29.5 pg (ref 27.0–33.0)
MCV: 86.8 fL (ref 80.0–100.0)
Monocytes Relative: 11.1 %
Neutro Abs: 3436 cells/uL (ref 1500–7800)
Platelets: 180 10*3/uL (ref 140–400)
RBC: 5.32 10*6/uL (ref 4.20–5.80)
RDW: 14.4 % (ref 11.0–15.0)
WBC: 8.4 10*3/uL (ref 3.8–10.8)

## 2022-11-26 LAB — LIPID PANEL
Cholesterol: 158 mg/dL (ref <200)
HDL: 38 mg/dL — ABNORMAL LOW (ref >=40)
LDL Cholesterol (Calc): 93 mg/dL (calc)
Non-HDL Cholesterol (Calc): 120 mg/dL (calc) (ref <130)
Total CHOL/HDL Ratio: 4.2 (calc) (ref <5.0)
Triglycerides: 175 mg/dL — ABNORMAL HIGH (ref <150)

## 2022-12-08 ENCOUNTER — Other Ambulatory Visit: Payer: Self-pay | Admitting: Family Medicine

## 2022-12-08 MED ORDER — TRAZODONE HCL 50 MG PO TABS: 50.0000 mg | ORAL_TABLET | Freq: Every evening | ORAL | 1 refills | Status: DC | PRN

## 2022-12-08 MED ORDER — GABAPENTIN 100 MG PO CAPS: 100.0000 mg | ORAL_CAPSULE | Freq: Two times a day (BID) | ORAL | 0 refills | Status: DC

## 2022-12-08 NOTE — Telephone Encounter (Signed)
Patient called to follow up on refills for   gabapentin (NEURONTIN) 100 MG capsule   traZODone (DESYREL) 50 MG tablet [[id number]]   Patient stated he uses [facility] mail order which prefers a 90 day supply; current order written for a 30 day supply and 12 refills. Patient is requesting for provider to send pharmacy new script for 90 days to prevent having to call for refills as frequently.  Patient also requesting 90 day scripts for all of the other medications sent to [facility] moving forward.  Pharmacy info:  [facility] - [place], [place] - [address] [place] [address] Phone: [phone]  Fax: [phone]   Please advise at [phone]

## 2022-12-14 ENCOUNTER — Other Ambulatory Visit: Payer: Self-pay | Admitting: Family Medicine

## 2022-12-14 MED ORDER — LISINOPRIL-HYDROCHLOROTHIAZIDE 10-12.5 MG PO TABS: 1.0000 | ORAL_TABLET | Freq: Every day | ORAL | 1 refills | Status: DC

## 2022-12-14 MED ORDER — ROSUVASTATIN CALCIUM 10 MG PO TABS: 10.0000 mg | ORAL_TABLET | Freq: Every day | ORAL | 3 refills | Status: DC

## 2022-12-14 MED ORDER — ESCITALOPRAM OXALATE 20 MG PO TABS: 20.0000 mg | ORAL_TABLET | Freq: Every day | ORAL | 1 refills | Status: DC

## 2022-12-14 MED ORDER — ASPIRIN 81 MG PO TBEC: 81.0000 mg | DELAYED_RELEASE_TABLET | Freq: Every day | ORAL | 1 refills | Status: AC

## 2022-12-14 MED ORDER — TRAZODONE HCL 100 MG PO TABS: 100.0000 mg | ORAL_TABLET | Freq: Every day | ORAL | 1 refills | Status: DC

## 2022-12-21 ENCOUNTER — Other Ambulatory Visit: Payer: Self-pay | Admitting: Family Medicine

## 2023-01-12 ENCOUNTER — Other Ambulatory Visit: Payer: Self-pay | Admitting: Family Medicine

## 2023-01-13 NOTE — Telephone Encounter (Signed)
Requested Prescriptions  Pending Prescriptions Disp Refills   gabapentin (NEURONTIN) 100 MG capsule [Pharmacy Med Name: Gabapentin 100 MG Oral Capsule] 60 capsule 11    Sig: TAKE 1 CAPSULE BY MOUTH TWICE  DAILY     Neurology: Anticonvulsants - gabapentin Failed - 01/12/2023  4:50 AM      Failed - Cr in normal range and within 360 days    Creat  Date Value Ref Range Status  11/25/2022 1.60 (H) 0.70 - 1.35 mg/dL Final         Failed - Valid encounter within last 12 months    Recent Outpatient Visits   None     Future Appointments             In 4 months Park Meo, FNP [facility]            Passed - Completed PHQ-2 or PHQ-9 in the last 360 days

## 2023-02-01 ENCOUNTER — Other Ambulatory Visit: Payer: Self-pay | Admitting: Family Medicine

## 2023-02-06 ENCOUNTER — Encounter: Payer: Self-pay | Admitting: Family Medicine

## 2023-02-06 ENCOUNTER — Ambulatory Visit: Payer: Medicare Other

## 2023-02-06 NOTE — Progress Notes (Signed)
Good morning ladies, this pt just came in demanding to get his bp checked. I have him checked in for a nurse visit (Nanette/front desk).   Spoke w/pt re his bp. Per pt he stated that he was unable to get his back/procedure (inject) for his nerve today per pt due to his bp readings were high at 151/98. I rechecked pt's bp it was 148/88. Pt also stated that someone change his bp med-Lisinopril to the combo med-Lisinopril-hydrochlorothiazide which pt claims is making his bp high. Per pt when he was on Just the Lisinopril, his bp was low never that high. Told pt I will let the provider know.   Spoke w/provider. Per provider to advice pt to make an appt to come back to today if possible to discussed his bp 148/88, so that she can have more time to review his chart as right now is seeing pts.   Pt refuse to make an appt. Pt demanded that provider called his [facility] and change his bp med back to the original med-Lisinopril or he is going to call his insurance company-[employer] and file a Theatre manager. Pt left.   Advice provider of what pt stated and is aware.

## 2023-02-14 ENCOUNTER — Ambulatory Visit (INDEPENDENT_AMBULATORY_CARE_PROVIDER_SITE_OTHER): Payer: Medicare Other | Admitting: Family Medicine

## 2023-02-14 ENCOUNTER — Encounter (HOSPITAL_BASED_OUTPATIENT_CLINIC_OR_DEPARTMENT_OTHER): Payer: Self-pay | Admitting: Family Medicine

## 2023-02-14 VITALS — BP 122/78 | HR 86 | Ht 69.0 in | Wt 190.0 lb

## 2023-02-14 DIAGNOSIS — N1832 Chronic kidney disease, stage 3b: Secondary | ICD-10-CM | POA: Diagnosis not present

## 2023-02-14 DIAGNOSIS — E782 Mixed hyperlipidemia: Secondary | ICD-10-CM | POA: Diagnosis not present

## 2023-02-14 DIAGNOSIS — I10 Essential (primary) hypertension: Secondary | ICD-10-CM

## 2023-02-14 MED ORDER — TRAZODONE HCL 100 MG PO TABS: 100.0000 mg | ORAL_TABLET | Freq: Every day | ORAL | 3 refills | Status: DC

## 2023-02-14 MED ORDER — ESCITALOPRAM OXALATE 20 MG PO TABS: 20.0000 mg | ORAL_TABLET | Freq: Every day | ORAL | 3 refills | Status: DC

## 2023-02-14 MED ORDER — GABAPENTIN 100 MG PO CAPS: 100.0000 mg | ORAL_CAPSULE | Freq: Two times a day (BID) | ORAL | 3 refills | Status: DC

## 2023-02-14 MED ORDER — LISINOPRIL-HYDROCHLOROTHIAZIDE 10-12.5 MG PO TABS: 2.0000 | ORAL_TABLET | Freq: Every day | ORAL | 3 refills | Status: DC

## 2023-02-14 MED ORDER — ROSUVASTATIN CALCIUM 10 MG PO TABS: 10.0000 mg | ORAL_TABLET | Freq: Every day | ORAL | 3 refills | Status: DC

## 2023-02-14 NOTE — Progress Notes (Signed)
New Patient Office Visit  Subjective:   Nicholas Cooke [birth year] 02/14/2023  Chief Complaint  Patient presents with   New Patient (Initial Visit)    Patient is here today to get established with the practice. Has concerns about his BP.    HPI: [name] presents today to establish care at [facility] at [facility]. Introduced to Publishing rights manager role and practice setting.  All questions answered.   Last PCP: Kurtis Bushman, FNP Concerns: See below    HYPERTENSION: [name] presents for the medical management of hypertension.  Patient states that he was previously on separate medications of lisinopril and hydrochlorothiazide.  He states that he went to have a procedure and noticed that his blood pressure had been elevated in the range of systolic 150s and diastolic 90s.  He had been changed to a combination medication of lisinopril-HCTZ.  He stated it had been approximately a month since his blood pressure was checked.  He was asymptomatic for hypertension at that time.  He states that his blood pressure is usually in the 116-120 range over 70-80.  He has started taking 2 tablets of the lisinopril-HCTZ 10-12.5 mg daily.  This has kept his blood pressure well-controlled.  He denies vision changes, chest pain, shortness of breath, dizziness or headaches.  He was concerned for blood pressure control with taking the combination tablet and wanted to be evaluated.  Patient's current hypertension medication regimen is: Lisinopril-hydrochlorothiazide 20-25 Patient is  currently taking prescribed medications for HTN.  Patient is  regularly keeping a check on BP at home.  Adhering to low sodium diet: yes Exercising Regularly: yes  BP Readings from Last 3 Encounters:  02/14/23 122/78  11/24/22 118/62  11/07/22 117/76   CHRONIC KIDNEY DISEASE: [name] presents for the medical management of Chronic Kidney Disease.  Patient is  adhering to  renal diet. Patient is  on ACE1/ARB therapy.  Patient is  avoiding NSAIDS.    Lab Results  Component Value Date   NA 134 (L) 11/25/2022   K 3.8 11/25/2022   CO2 25 11/25/2022   GLUCOSE 99 11/25/2022   BUN 19 11/25/2022   CREATININE 1.60 (H) 11/25/2022   CALCIUM 8.6 11/25/2022   EGFR 46 (L) 11/25/2022   GFRNONAA 46 (L) 01/31/2020     The following portions of the patient's history were reviewed and updated as appropriate: past medical history, past surgical history, family history, social history, allergies, medications, and problem list.   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 x 2 yr.   Inguinal hernia 12/2015   Osteoarthritis    Past Surgical History:  Procedure Laterality Date   APPENDECTOMY  yrs ago   CYSTOSCOPY WITH INSERTION OF UROLIFT N/A 07/03/2018   Procedure: CYSTOSCOPY WITH INSERTION OF UROLIFT;  Surgeon: Jerilee Field, MD;  Location: [facility];  Service: Urology;  Laterality: N/A;  ONLY NEEDS 30 MIN FOR PROCEDURE   HERNIA REPAIR  2014   INGUINAL HERNIA REPAIR  02/16/2012   Procedure: HERNIA REPAIR INGUINAL ADULT;  Surgeon: Shelly Rubenstein, MD;   Location: [facility];  Service: General;  Laterality: Left;  left inguinal hernia repair with mesh   INGUINAL HERNIA REPAIR Bilateral 12/23/2015   Procedure: LAPAROSCOPIC LEFT INGUINAL HERNIA WITH MESH;  Surgeon: Abigail Miyamoto, MD;  Location: [facility];  Service: General;  Laterality: Bilateral;   INSERTION OF MESH Bilateral 12/23/2015   Procedure: INSERTION OF MESH;  Surgeon: Abigail Miyamoto, MD;  Location: [facility];  Service: General;  Laterality: Bilateral;   JOINT REPLACEMENT     KNEE ARTHROSCOPY WITH MEDIAL MENISECTOMY Right 10/18/2013   Procedure: RIGHT KNEE ARTHROSCOPY WITH PARTIAL MEDIAL MENISECTOMY;  Surgeon: Javier Docker, MD;  Location: [facility];  Service: Orthopedics;  Laterality: Right;   SHOULDER ARTHROSCOPY WITH ROTATOR CUFF REPAIR Left 2001   TOTAL KNEE ARTHROPLASTY Right 05/14/2015   Procedure: REMOVAL OF HARDWARE AND RIGHT TOTAL KNEE ARTHROPLASTY;  Surgeon: Jene Every, MD;  Location: [facility];  Service: Orthopedics;  Laterality: Right;   ULNAR NERVE REPAIR Right 1996   Family History  Problem Relation Age of Onset   Arthritis Mother    Stroke Father    Early death Sister    Social History   Socioeconomic History   Marital status: Married    Spouse name: Not on file   Number of children: Not on file   Years of education: Not on file   Highest education level: Associate degree: occupational, Scientist, product/process development, or vocational program  Occupational History   Occupation: retired  Tobacco Use   Smoking status: Never   Smokeless tobacco: Never  Vaping Use   Vaping status: Never Used  Substance and Sexual Activity   Alcohol use: No   Drug use: No   Sexual activity: Not Currently    Birth control/protection: None  Other Topics Concern   Not on file  Social History Narrative   Right handed   Drinks caffeine   Two story home   Married   Retired from working for [employer]    Social Determinants of  Corporate investment banker Strain: Low Risk  (11/23/2022)   Overall Financial Resource Strain (CARDIA)    Difficulty of Paying Living Expenses: Not very hard  Food Insecurity: No Food Insecurity (11/23/2022)   Hunger Vital Sign    Worried About Running Out of Food in the Last Year: Never true    Ran Out of Food in the Last Year: Never true  Transportation Needs: No Transportation Needs (11/23/2022)   PRAPARE - Administrator, Civil Service (Medical): No    Lack of Transportation (Non-Medical): No  Physical Activity: Insufficiently Active (11/23/2022)   Exercise Vital Sign    Days of Exercise per Week: 3 days    Minutes of Exercise per Session: 10 min  Stress: No Stress Concern Present (11/23/2022)   [employer] of Occupational Health - Occupational Stress Questionnaire    Feeling of Stress : Only a little  Social Connections: Moderately Integrated (11/23/2022)   Social Connection and Isolation Panel [NHANES]    Frequency of Communication with Friends and Family: More than  three times a week    Frequency of Social Gatherings with Friends and Family: Three times a week    Attends Religious Services: Never    Active Member of Clubs or Organizations: No    Attends Banker Meetings: 1 to 4 times per year    Marital Status: Married  Catering manager Violence: Not At Risk (07/12/2022)   Humiliation, Afraid, Rape, and Kick questionnaire    Fear of Current or Ex-Partner: No    Emotionally Abused: No    Physically Abused: No    Sexually Abused: No   Outpatient Medications Prior to Visit  Medication Sig Dispense Refill   aspirin EC 81 MG tablet Take 1 tablet (81 mg total) by mouth daily. Swallow whole. 90 tablet 1   tadalafil (CIALIS) 5 MG tablet Take 5 mg by mouth daily as needed for erectile dysfunction.     escitalopram (LEXAPRO) 20 MG tablet Take 1 tablet (20 mg total) by mouth daily. 90 tablet 1   gabapentin (NEURONTIN) 100 MG capsule TAKE 1 CAPSULE BY MOUTH  TWICE  DAILY 60 capsule 11   lisinopril-hydrochlorothiazide (ZESTORETIC) 10-12.5 MG tablet Take 1 tablet by mouth daily. 90 tablet 1   rosuvastatin (CRESTOR) 10 MG tablet Take 1 tablet (10 mg total) by mouth daily. 90 tablet 3   traZODone (DESYREL) 100 MG tablet Take 1 tablet (100 mg total) by mouth at bedtime. 90 tablet 1   HYDROcodone-acetaminophen (NORCO/VICODIN) 5-325 MG tablet Take 1 tablet by mouth every 6 (six) hours as needed (pain). 20 tablet 0   No facility-administered medications prior to visit.   No Known Allergies  ROS: A complete ROS was performed with pertinent positives/negatives noted in the HPI. The remainder of the ROS are negative.   Objective:   Today's Vitals   02/14/23 1343 02/14/23 1420  BP: 126/73 122/78  Pulse: 86   SpO2: 99%   Weight: 190 lb (86.2 kg)   Height: 5\' 9"  (1.753 m)     GENERAL: Well-appearing, in NAD. Well nourished.  SKIN: Pink, warm and dry. No rash, lesion, ulceration, or ecchymoses.  Head: Normocephalic. NECK: Trachea midline. Full ROM w/o pain or tenderness.  EYES: Conjunctiva clear without exudates. EOMI, PERRL, no drainage present.  RESPIRATORY: Chest wall symmetrical. Respirations even and non-labored. Breath sounds clear to auscultation bilaterally.  CARDIAC: S1, S2 present, regular rate and rhythm without murmur or gallops. Peripheral pulses 2+ bilaterally.  MSK: Muscle tone and strength appropriate for age. EXTREMITIES: Without clubbing, cyanosis, or edema.  NEUROLOGIC: No motor or sensory deficits. Steady, even gait. C2-C12 intact.  PSYCH/MENTAL STATUS: Alert, oriented x 3. Cooperative, appropriate mood and affect.    No results found for any visits on 02/14/23.     Assessment & Plan:  1. Mixed hyperlipidemia Patient tolerating Crestor well without any adverse effects.  Will refill this for patient and recheck his cholesterol in 3 to 4 months. - Lipid panel; Future  2. Essential hypertension PCP recommended blood draw to  evaluate renal and electrolytes with taking diuretic and ACE inhibitor, patient declined today.  Would prefer to recheck this in 3 to 4 months.  Labs ordered and medications refilled.  Instructed patient to monitor blood pressure at least 2-3 times per week and keep a log.  Goal is less than 150/80 given patient's age and CKD.  We discussed possible tolerance to blood pressure medications and fluctuations depending on salt intake, stress, pain.  He will notify PCP if BP continues to elevate and  we will adjust medication. - Comprehensive metabolic panel; Future - CBC with Differential/Platelet; Future  3. Stage 3b chronic kidney disease (HCC) Stable per chart review.  Recommend BNP today, patient declined.  Will repeat renal function in 3 to 4 months.  Avoid NSAIDs, continue to stay well-hydrated and continue on ACE inhibitor. - Comprehensive metabolic panel; Future - CBC with Differential/Platelet; Future    Patient to reach out to office if new, worrisome, or unresolved symptoms arise or if no improvement in patient's condition. Patient verbalized understanding and is agreeable to treatment plan. All questions answered to patient's satisfaction.    Return in about 3 months (around 05/17/2023) for HYPERTENSION CKD Check up ( Fasting labs prior) .   Of note, portions of this note may have been created with voice recognition software Physicist, medical). While this note has been edited for accuracy, occasional wrong-word or 'sound-a-like' substitutions may have occurred due to the inherent limitations of voice recognition software.  Yolanda Manges, FNP

## 2023-02-17 ENCOUNTER — Encounter (HOSPITAL_BASED_OUTPATIENT_CLINIC_OR_DEPARTMENT_OTHER): Payer: Self-pay | Admitting: Family Medicine

## 2023-02-17 ENCOUNTER — Other Ambulatory Visit (HOSPITAL_BASED_OUTPATIENT_CLINIC_OR_DEPARTMENT_OTHER): Payer: Self-pay | Admitting: Family Medicine

## 2023-02-17 MED ORDER — LISINOPRIL-HYDROCHLOROTHIAZIDE 20-25 MG PO TABS: 1.0000 | ORAL_TABLET | Freq: Every day | ORAL | 3 refills | Status: DC

## 2023-05-08 ENCOUNTER — Other Ambulatory Visit (HOSPITAL_BASED_OUTPATIENT_CLINIC_OR_DEPARTMENT_OTHER): Payer: Medicare Other

## 2023-05-09 LAB — LIPID PANEL
Chol/HDL Ratio: 4.3 {ratio} (ref 0.0–5.0)
Cholesterol, Total: 133 mg/dL (ref 100–199)
HDL: 31 mg/dL — ABNORMAL LOW (ref >39)
LDL Chol Calc (NIH): 74 mg/dL (ref 0–99)
Triglycerides: 161 mg/dL — ABNORMAL HIGH (ref 0–149)
VLDL Cholesterol Cal: 28 mg/dL (ref 5–40)

## 2023-05-09 LAB — CBC WITH DIFFERENTIAL/PLATELET
Basophils Absolute: 0 10*3/uL (ref 0.0–0.2)
Basos: 0 %
EOS (ABSOLUTE): 0.1 10*3/uL (ref 0.0–0.4)
Eos: 1 %
Hematocrit: 47.1 % (ref 37.5–51.0)
Hemoglobin: 16.4 g/dL (ref 13.0–17.7)
Immature Grans (Abs): 0 10*3/uL (ref 0.0–0.1)
Immature Granulocytes: 0 %
Lymphocytes Absolute: 4.9 10*3/uL — ABNORMAL HIGH (ref 0.7–3.1)
Lymphs: 51 %
MCH: 30.3 pg (ref 26.6–33.0)
MCHC: 34.8 g/dL (ref 31.5–35.7)
MCV: 87 fL (ref 79–97)
Monocytes Absolute: 0.8 10*3/uL (ref 0.1–0.9)
Monocytes: 9 %
Neutrophils Absolute: 3.7 10*3/uL (ref 1.4–7.0)
Neutrophils: 39 %
Platelets: 260 10*3/uL (ref 150–450)
RBC: 5.42 x10E6/uL (ref 4.14–5.80)
RDW: 13.5 % (ref 11.6–15.4)
WBC: 9.4 10*3/uL (ref 3.4–10.8)

## 2023-05-09 LAB — COMPREHENSIVE METABOLIC PANEL
ALT: 16 [IU]/L (ref 0–44)
AST: 24 [IU]/L (ref 0–40)
Albumin: 4.4 g/dL (ref 3.9–4.9)
Alkaline Phosphatase: 72 [IU]/L (ref 44–121)
BUN/Creatinine Ratio: 8 — ABNORMAL LOW (ref 10–24)
BUN: 14 mg/dL (ref 8–27)
Bilirubin Total: 0.5 mg/dL (ref 0.0–1.2)
CO2: 19 mmol/L — ABNORMAL LOW (ref 20–29)
Calcium: 9.3 mg/dL (ref 8.6–10.2)
Chloride: 104 mmol/L (ref 96–106)
Creatinine, Ser: 1.78 mg/dL — ABNORMAL HIGH (ref 0.76–1.27)
Globulin, Total: 3.4 g/dL (ref 1.5–4.5)
Glucose: 111 mg/dL — ABNORMAL HIGH (ref 70–99)
Potassium: 3.8 mmol/L (ref 3.5–5.2)
Sodium: 140 mmol/L (ref 134–144)
Total Protein: 7.8 g/dL (ref 6.0–8.5)
eGFR: 41 mL/min/{1.73_m2} — ABNORMAL LOW (ref >59)

## 2023-05-09 LAB — HEMOGLOBIN A1C
Est. average glucose Bld gHb Est-mCnc: 128 mg/dL
Hgb A1c MFr Bld: 6.1 % — ABNORMAL HIGH (ref 4.8–5.6)

## 2023-05-17 ENCOUNTER — Other Ambulatory Visit: Payer: Self-pay | Admitting: Family Medicine

## 2023-05-19 ENCOUNTER — Encounter (HOSPITAL_BASED_OUTPATIENT_CLINIC_OR_DEPARTMENT_OTHER): Payer: Self-pay | Admitting: Family Medicine

## 2023-05-19 ENCOUNTER — Ambulatory Visit (HOSPITAL_BASED_OUTPATIENT_CLINIC_OR_DEPARTMENT_OTHER): Payer: Medicare Other | Admitting: Family Medicine

## 2023-05-19 VITALS — BP 117/83 | HR 77 | Ht 69.0 in | Wt 188.0 lb

## 2023-05-19 DIAGNOSIS — R7303 Prediabetes: Secondary | ICD-10-CM | POA: Diagnosis not present

## 2023-05-19 DIAGNOSIS — N1832 Chronic kidney disease, stage 3b: Secondary | ICD-10-CM | POA: Diagnosis not present

## 2023-05-19 DIAGNOSIS — I10 Essential (primary) hypertension: Secondary | ICD-10-CM | POA: Diagnosis not present

## 2023-05-19 DIAGNOSIS — E782 Mixed hyperlipidemia: Secondary | ICD-10-CM

## 2023-05-19 DIAGNOSIS — J310 Chronic rhinitis: Secondary | ICD-10-CM

## 2023-05-19 DIAGNOSIS — D72820 Lymphocytosis (symptomatic): Secondary | ICD-10-CM

## 2023-05-19 MED ORDER — METFORMIN HCL ER 500 MG PO TB24: 500.0000 mg | ORAL_TABLET | Freq: Every day | ORAL | 3 refills | Status: DC

## 2023-05-19 MED ORDER — LISINOPRIL 40 MG PO TABS: 40.0000 mg | ORAL_TABLET | Freq: Every day | ORAL | 3 refills | Status: DC

## 2023-05-19 NOTE — Patient Instructions (Addendum)
 Stop your Lisinopril -hydrochlorothiazide  20-25mg  and switch to Lisinopril  40mg  please.   Monitor your blood pressure daily. Goal is less than 130/80.   Please follow up with your pain specialist as scheduled.   I will place referrals to Hematology and ENT for you. They should reach out within the next 2 weeks to make an appointment.

## 2023-05-19 NOTE — Progress Notes (Signed)
 Subjective:   Nicholas Cooke 04-15-1951 05/19/2023  Chief Complaint  Patient presents with   Medical Management of Chronic Issues    4-month follow up; states he has been having problems with back pain and also has been having problems with his nose which he sees an ENT for.    HPI: Nicholas Cooke presents today for re-assessment and management of chronic medical conditions.  IMPAIRED FASTING GLUCOSE Nicholas Cooke is here for medical management of impaired fasting glucose.  This is a new diagnosis for patient.  No previous A1c present per chart review. Patient's current IFG medication regimen is: diet Adhering to a diabetic diet: Yes, patient has made considerable changes to his diet with decrease of red meat, fatty foods, and sugar. Exercising Regularly: No Checking Blood Sugars: No Denies polydipsia, polyphagia, polyuria.   Lab Results  Component Value Date   HGBA1C 6.1 (H) 05/08/2023    CHRONIC KIDNEY DISEASE: Nicholas Cooke presents for the medical management of Chronic Kidney Disease. His renal function has worsened slightly in the past 12 months per chart review.  Patient is  adhering to renal diet. Patient is  on ACE1/ARB therapy. Currently on Lisinopril - hydrochlorothiazide  20-25 mg daily.   Patient is  avoiding NSAIDS.     Does not attend dialysis and does not perform peritoneal dialysis.   Lab Results  Component Value Date   NA 140 05/08/2023   K 3.8 05/08/2023   CO2 19 (L) 05/08/2023   GLUCOSE 111 (H) 05/08/2023   BUN 14 05/08/2023   CREATININE 1.78 (H) 05/08/2023   CALCIUM  9.3 05/08/2023   EGFR 41 (L) 05/08/2023   GFRNONAA 46 (L) 01/31/2020    HYPERLIPIDEMIA: Nicholas Cooke presents for the medical management of hyperlipidemia.  Patient's current HLD regimen is: Crestor  10mg  Patient is  currently taking prescribed medications for HLD.  Adhering to heathy diet: Yes, had made significant dietary changes and has seen improvement in triglyceride level over  the past 12 months.  Exercising regularly: No  Denies myalgias.   Lab Results  Component Value Date   CHOL 133 05/08/2023   CHOL 158 11/25/2022   CHOL 136 06/30/2022   Lab Results  Component Value Date   HDL 31 (L) 05/08/2023   HDL 38 (L) 11/25/2022   HDL 30 (L) 06/30/2022   Lab Results  Component Value Date   LDLCALC 74 05/08/2023   LDLCALC 93 11/25/2022   LDLCALC 75 06/30/2022   Lab Results  Component Value Date   TRIG 161 (H) 05/08/2023   TRIG 175 (H) 11/25/2022   TRIG 214 (H) 06/30/2022   Lab Results  Component Value Date   CHOLHDL 4.3 05/08/2023   CHOLHDL 4.2 11/25/2022   CHOLHDL 4.5 06/30/2022   No results found for: LDLDIRECT   The 10-year ASCVD risk score (Arnett DK, et al., 2019) is: 16.6%   Back Pain:  Patient had nerve ablation and was given 5 day supply of Tramadol .    ENT CONCERN :  Patient has sensation of obstruction and possible growth to nasal passages and nasal crusting ongoing for several years.  Patient has seen ENT from 2022-2024 most recent visit in May 2024.  No nasal growth has ever been seen or identified on patient's exams.  Patient most recently had a laryngoscopy in May 2024 by ENT with [facility] due to tingling sensation in his throat.  He was diagnosed with possible GERD causing sensation and recommended to start on PPI therapy.  He was unaware  of this medication and thus has not taken.  His nasal exam was unremarkable and patient was recommended frequent use of nasal saline rinses and application of mupirocin ointment.  Patient states that he followed directions for mupirocin ointment and had no improvement.  He has tried nasal glucocorticoids in the past without improvement.  He would be open to seeing a new ENT for a second opinion.  Denies bleeding, pain, excessive drainage or trauma.  The following portions of the patient's history were reviewed and updated as appropriate: past medical history, past surgical history, family  history, social history, allergies, medications, and problem list.   Patient Active Problem List   Diagnosis Date Noted   Prediabetes 05/19/2023   Acute bilateral low back pain without sciatica 11/24/2022   Benign prostatic hyperplasia without lower urinary tract symptoms 05/17/2022   Stage 3b chronic kidney disease (HCC) 05/17/2022   Diverticulosis 05/17/2022   Hyperlipidemia 05/17/2022   Depression, recurrent (HCC) 05/17/2022   Essential hypertension 06/15/2020   Bilateral impacted cerumen 03/15/2018   Rhinitis 03/15/2018   Tinnitus aurium, bilateral 03/15/2018   Osteoarthritis of right knee 05/14/2015   Right knee DJD 05/14/2015   Hemiplegia affecting nondominant side (HCC) 06/13/2013   Left inguinal hernia 01/24/2012   Past Medical History:  Diagnosis Date   Chronic kidney disease    Dental crowns present    Depression    Enlarged prostate    Hypertension    states under control with med., has been on med. x 2 yr.   Inguinal hernia 12/2015   Osteoarthritis    Past Surgical History:  Procedure Laterality Date   APPENDECTOMY  yrs ago   CYSTOSCOPY WITH INSERTION OF UROLIFT N/A 07/03/2018   Procedure: CYSTOSCOPY WITH INSERTION OF UROLIFT;  Surgeon: Nieves Cough, MD;  Location: [facility];  Service: Urology;  Laterality: N/A;  ONLY NEEDS 30 MIN FOR PROCEDURE   HERNIA REPAIR  2014   INGUINAL HERNIA REPAIR  02/16/2012   Procedure: HERNIA REPAIR INGUINAL ADULT;  Surgeon: Vicenta DELENA Poli, MD;  Location: [facility];  Service: General;  Laterality: Left;  left inguinal hernia repair with mesh   INGUINAL HERNIA REPAIR Bilateral 12/23/2015   Procedure: LAPAROSCOPIC LEFT INGUINAL HERNIA WITH MESH;  Surgeon: Vicenta Poli, MD;  Location: [facility];  Service: General;  Laterality: Bilateral;   INSERTION OF MESH Bilateral 12/23/2015   Procedure: INSERTION OF MESH;  Surgeon: Vicenta Poli, MD;  Location: [facility];  Service: General;  Laterality: Bilateral;   JOINT REPLACEMENT     KNEE ARTHROSCOPY WITH MEDIAL MENISECTOMY Right 10/18/2013   Procedure: RIGHT KNEE ARTHROSCOPY WITH PARTIAL MEDIAL MENISECTOMY;  Surgeon: Reyes JAYSON Billing, MD;  Location: [facility];  Service: Orthopedics;  Laterality: Right;   SHOULDER ARTHROSCOPY WITH ROTATOR CUFF REPAIR Left 2001   TOTAL KNEE ARTHROPLASTY Right 05/14/2015   Procedure: REMOVAL OF HARDWARE AND RIGHT TOTAL KNEE ARTHROPLASTY;  Surgeon: Reyes Billing, MD;  Location: [facility];  Service: Orthopedics;  Laterality: Right;   ULNAR NERVE REPAIR Right 1996   Family History  Problem Relation Age of Onset   Arthritis Mother    Stroke Father    Early death Sister    Outpatient Medications Prior to Visit  Medication Sig Dispense Refill   aspirin  EC 81 MG tablet Take 1 tablet (81 mg total) by mouth daily. Swallow whole. 90 tablet 1   escitalopram  (LEXAPRO ) 20 MG tablet Take 1 tablet (20 mg total) by mouth daily.  90 tablet 3   gabapentin  (NEURONTIN ) 100 MG capsule Take 1 capsule (100 mg total) by mouth 2 (two) times daily. 180 capsule 3   rosuvastatin  (CRESTOR ) 10 MG tablet Take 1 tablet (10 mg total) by mouth daily. 90 tablet 3   tadalafil (CIALIS) 5 MG tablet Take 5 mg by mouth daily as needed for erectile dysfunction.     traZODone  (DESYREL ) 100 MG tablet Take 1 tablet (100 mg total) by mouth at bedtime. 90 tablet 3   lisinopril -hydrochlorothiazide  (ZESTORETIC ) 20-25 MG tablet Take 1 tablet by mouth daily. 90 tablet 3   No facility-administered medications prior to visit.   No Known Allergies   ROS: A complete ROS was performed with pertinent positives/negatives noted in the HPI. The remainder of the ROS are negative.    Objective:   Today's Vitals   05/19/23 1011  BP: 117/83  Pulse: 77  SpO2: 98%  Weight: 188 lb (85.3 kg)  Height: 5' 9 (1.753 m)    Physical Exam          GENERAL: Well-appearing, in NAD. Well nourished.  SKIN: Pink, warm and dry.  No rash, lesion, ulceration, or ecchymoses.  Head: Normocephalic. NECK: Trachea midline. Full ROM w/o pain or tenderness. No lymphadenopathy.  EARS: Tympanic membranes are intact, translucent without bulging and without drainage. Appropriate landmarks visualized.  EYES: Conjunctiva clear without exudates. EOMI, PERRL, no drainage present. Mild cerumen present bilaterally, non impacted.  NOSE: Septum midline w/o deformity. Nares patent, mucosa pink and mildly inflamed turbinates without drainage to right nare. Non-inflamed w/o drainage to left nare. No sinus tenderness.  THROAT: Uvula midline. Oropharynx clear.  Mucous membranes pink and moist.  RESPIRATORY: Chest wall symmetrical. Respirations even and non-labored. Breath sounds clear to auscultation bilaterally.  CARDIAC: S1, S2 present, regular rate and rhythm without murmur or gallops. Peripheral pulses 2+ bilaterally.  MSK: Muscle tone and strength appropriate for age. Joints w/o tenderness, redness, or swelling.  EXTREMITIES: Without clubbing, cyanosis, or edema.  NEUROLOGIC: No motor or sensory deficits. Steady, even gait. C2-C12 intact.  PSYCH/MENTAL STATUS: Alert, oriented x 3. Cooperative, appropriate mood and affect.     Assessment & Plan:  1. Essential hypertension (Primary) Stable and well-controlled.  Due to patient's CKD, will make changes with removal of HCTZ from medication regimen due to possible renal damage and increase patient's lisinopril  to 40 mg daily.  Will return for blood pressure check in approximately 6 weeks.  Patient is compliant with checking blood pressure regularly at home and will notify PCP if uncontrolled with changes to medication regimen.  2. Stage 3b chronic kidney disease (HCC) Worsening function.  Will stop HCTZ and will increase lisinopril  from 20 mg to 40 mg daily.  Discussed renal diet, increasing hydration and avoidance of NSAIDs with patient.  He verbalized understanding.  He will also monitor blood  pressure regularly and return in 6 weeks for BP check and repeat renal function in approximately 3 to 4 months.  3. Mixed hyperlipidemia Stable, continue Crestor  as prescribed.  Patient doing well with dietary changes and congratulated on improvement of triglyceride level.  Discussed good dietary and nutrition therapy with patient.  4. Prediabetes Recommended dietary changes and exercise, but patient would like to start metformin  therapy to prevent progression of type 2 diabetes.  Will start metformin  500 mg XR daily with breakfast and repeat A1c in approximately 4 months.  5. Lymphocytosis Asymptomatic. Discussed recheck in 1 month with watchful waiting versus evaluation with Hematology. Pt requested referral  to hematology. Referral placed.   6. Chronic rhinitis Discussed possible triggers and recurring sinusitis versus rhinitis.  Recommend referral to [facility] for further evaluation. - Ambulatory referral to ENT   Meds ordered this encounter  Medications   metFORMIN  (GLUCOPHAGE -XR) 500 MG 24 hr tablet    Sig: Take 1 tablet (500 mg total) by mouth daily with breakfast.    Dispense:  90 tablet    Refill:  3    Supervising Provider:   DE CUBA, RAYMOND J [[id number]]   lisinopril  (ZESTRIL ) 40 MG tablet    Sig: Take 1 tablet (40 mg total) by mouth daily.    Dispense:  90 tablet    Refill:  3    Supervising Provider:   DE CUBA, RAYMOND J [[id number]]    Return for 6 week BP check only and 4 month follow up w/ labs fasting prior .    Patient to reach out to office if new, worrisome, or unresolved symptoms arise or if no improvement in patient's condition. Patient verbalized understanding and is agreeable to treatment plan. All questions answered to patient's satisfaction.    Thersia Schuyler Stark, [place]

## 2023-05-23 ENCOUNTER — Other Ambulatory Visit (HOSPITAL_BASED_OUTPATIENT_CLINIC_OR_DEPARTMENT_OTHER): Payer: Self-pay | Admitting: Family Medicine

## 2023-05-23 NOTE — Telephone Encounter (Signed)
 Copied from CRM [id number]. Topic: Clinical - Medication Refill >> May 23, 2023  8:16 AM Benton KIDD wrote: Most Recent Primary Care Visit:  Provider: KNUTE THERSIA BITTERS  Department: [facility]  Visit Type: FOLLOW UP  Date: 05/19/2023  Medication: lisinopril  (ZESTRIL ) 40 MG tablet  Has the patient contacted their pharmacy? Yes pharmacy gave patient old lisinopril  (Agent: If no, request that the patient contact the pharmacy for the refill. If patient does not wish to contact the pharmacy document the reason why and proceed with request.) (Agent: If yes, when and what did the pharmacy advise?)  Is this the correct pharmacy for this prescription? Yes If no, delete pharmacy and type the correct one.  This is the patient's preferred pharmacy:  [facility] [id number] - [place], [place] - [address] [place] [address] Phone: [phone] Fax: [phone]   Has the prescription been filled recently? No first fill  Is the patient out of the medication? N/A have not received it yet  Has the patient been seen for an appointment in the last year OR does the patient have an upcoming appointment? Yes  Can we respond through MyChart? Yes  Agent: Please be advised that Rx refills may take up to 3 business days. We ask that you follow-up with your pharmacy.

## 2023-05-23 NOTE — Telephone Encounter (Signed)
 Copied from CRM [id number]. Topic: Clinical - Prescription Issue >> May 23, 2023  8:13 AM Benton KIDD wrote: Reason for CRM: patient is calling cause doctor prescribed new medication . When patient picked up medication it was the same blood pressure meds that doctor is suppose to drop. lisinopril   and the new blood pressure suppose to be lisinopril  (ZESTRIL ) 40 MG tablet. Sending a refill for the 40 mg tablet because they gave patient the old lisinopril  instead of the new one doctor prescribed  Reason for CRM: patient is calling cause doctor prescribed new medication . When patient picked up medication it was the same blood pressure meds that doctor is suppose to drop. lisinopril   and the new blood pressure suppose to be lisinopril  (ZESTRIL ) 40 MG tablet. Sending a refill for the 40 mg tablet because they gave patient the old lisinopril  instead of the new one doctor prescribed

## 2023-05-24 ENCOUNTER — Other Ambulatory Visit: Payer: Self-pay | Admitting: Family Medicine

## 2023-05-25 ENCOUNTER — Telehealth (HOSPITAL_BASED_OUTPATIENT_CLINIC_OR_DEPARTMENT_OTHER): Payer: Self-pay | Admitting: Family Medicine

## 2023-05-25 NOTE — Telephone Encounter (Signed)
 Refill request received from pharmacy for pt's Trazodone  and Escitalopram . Pharmacy is [facility]. Mail order prescriptions are usually sent as a 90-day supply.  Nicholas Cooke, please advise if you would be okay sending both of these meds to [facility] for patient as that is where he wants meds sent to that will be considered long term meds.

## 2023-05-27 ENCOUNTER — Other Ambulatory Visit (HOSPITAL_BASED_OUTPATIENT_CLINIC_OR_DEPARTMENT_OTHER): Payer: Self-pay | Admitting: Family Medicine

## 2023-05-27 MED ORDER — ESCITALOPRAM OXALATE 20 MG PO TABS: 20.0000 mg | ORAL_TABLET | Freq: Every day | ORAL | 3 refills | Status: DC

## 2023-05-27 MED ORDER — TRAZODONE HCL 100 MG PO TABS: 100.0000 mg | ORAL_TABLET | Freq: Every day | ORAL | 3 refills | Status: DC

## 2023-05-29 ENCOUNTER — Encounter: Payer: Medicare Other | Admitting: Family Medicine

## 2023-05-31 NOTE — Telephone Encounter (Signed)
 Meds refilled 11/1

## 2023-06-21 ENCOUNTER — Telehealth (INDEPENDENT_AMBULATORY_CARE_PROVIDER_SITE_OTHER): Payer: Self-pay | Admitting: Otolaryngology

## 2023-06-21 NOTE — Telephone Encounter (Signed)
 Reminder Call: Date: 06/22/2023 Status: Sch  Time: 8:00 AM [address] [place], [place] [address]  Confirmed time and location w/patient.

## 2023-06-22 ENCOUNTER — Encounter (INDEPENDENT_AMBULATORY_CARE_PROVIDER_SITE_OTHER): Payer: Self-pay

## 2023-06-22 ENCOUNTER — Ambulatory Visit (INDEPENDENT_AMBULATORY_CARE_PROVIDER_SITE_OTHER): Payer: Medicare Other | Admitting: Otolaryngology

## 2023-06-22 VITALS — BP 134/86 | HR 69

## 2023-06-22 DIAGNOSIS — J3489 Other specified disorders of nose and nasal sinuses: Secondary | ICD-10-CM

## 2023-06-22 MED ORDER — AZELASTINE HCL 0.1 % NA SOLN: 2.0000 | Freq: Two times a day (BID) | NASAL | 12 refills | Status: DC | PRN

## 2023-06-22 NOTE — Progress Notes (Signed)
 Dear Dr. Knute, Here is my assessment for our mutual patient, Nicholas Cooke. Thank you for allowing me the opportunity to care for your patient. Please do not hesitate to contact me should you have any other questions. Sincerely, Dr. Eldora Cooke  Otolaryngology Clinic Note Referring provider: Dr. Knute HPI:  Nicholas Cooke is a 73 y.o. male kindly referred by Dr. Knute for evaluation of nasal crusting and morning time nasal drainage.  Initial visit (06/22/2023): He reports that he has had problem with nasal crusting for years, and he reports it seems like it is flakey skin. He feels like there is something there. Does not itch. Worse when he lets it go for a couple of days, and builds up. It is mostly anterior (points to nasal valve area). He uses vicks (before he goes to sleep and in AM), and saline spray to clear it up. He also reports mucoid rhinorrhea bilateral rhinorrhea in the morning. He is on PO anthistamine and this dries his nose out enough and this helps with the rhinorrhea.   No frequent sinus infections - no facial pressure, pain, discolored drainage, intermittent congestion, and sense of smell is intact. No typical AR symptoms. No prior allergy testing.   Does not use ayr gel or vaseline. No rinse use. No decongestant use Does not use CPAP, nose does not feel dry  H&N Surgery: Prior Nasal Surgery on right (unclear what it was) - late 90s Personal or FHx of bleeding dz or anesthesia difficulty: no   GLP-1: no AP/AC: ASA 81  Tobacco: no. Occupation: drove trash and recycling truck. Lives in [place], [place]  PMHx: HTN, CKD, HLD, Prediabetes, Lymphocytosis  Independent Review of Additional Tests or Records:  Nicholas Cooke (05/19/2023): Noted sensation of obstruction and possible growth and nasal crusting. Nothing identified. Dx prior with GERD and PPI. No improvement on mupirocin. Wishes for 2nd opinion; Dx: Rhinitis; Rx: Ref ENT Dr. Llewellyn (09/19/2022) and (06/28/2021)  [facility]: growth in nose, no mass; crusting/dryness; recent sinus CT and MRI; No significant nasal symptom; Dx: some step off from nasal bones, no infection; Rec irrigations, vaseline. In 2024 had nocturnal cough episodes; flonase without benefit; Dx: Globus and nasal crusting; reassured; f/u PRN CBC and CMP 05/08/2023: CR 1.78/BUN 14; LFTs generally wnl; CBC wnl except lymphocytosis; Eos 100 CT Face 06/02/2021 independently interpreted and reviewed with respect to nasal cavity and ears: agree with read, left septal deviation, minimal frontal opacification, no significant disease; mastoids, ME well aerated; ossicles unremarkable MRI Brain 03/24/2021 independent interpret: no significant sinus disease, no large retrocochlear lesions PMH/Meds/All/SocHx/FamHx/ROS:   Past Medical History:  Diagnosis Date   Chronic kidney disease    Dental crowns present    Depression    Enlarged prostate    Hypertension    states under control with med., has been on med. x 2 yr.   Inguinal hernia 12/2015   Osteoarthritis      Past Surgical History:  Procedure Laterality Date   APPENDECTOMY  yrs ago   CYSTOSCOPY WITH INSERTION OF UROLIFT N/A 07/03/2018   Procedure: CYSTOSCOPY WITH INSERTION OF UROLIFT;  Surgeon: Nieves Cough, MD;  Location: [facility];  Service: Urology;  Laterality: N/A;  ONLY NEEDS 30 MIN FOR PROCEDURE   HERNIA REPAIR  2014   INGUINAL HERNIA REPAIR  02/16/2012   Procedure: HERNIA REPAIR INGUINAL ADULT;  Surgeon: Vicenta DELENA Poli, MD;  Location: [facility];  Service: General;  Laterality: Left;  left inguinal hernia repair  with mesh   INGUINAL HERNIA REPAIR Bilateral 12/23/2015   Procedure: LAPAROSCOPIC LEFT INGUINAL HERNIA WITH MESH;  Surgeon: Vicenta Poli, MD;  Location: [facility];  Service: General;  Laterality: Bilateral;   INSERTION OF MESH Bilateral 12/23/2015   Procedure: INSERTION OF MESH;  Surgeon: Vicenta Poli, MD;   Location: [facility];  Service: General;  Laterality: Bilateral;   JOINT REPLACEMENT     KNEE ARTHROSCOPY WITH MEDIAL MENISECTOMY Right 10/18/2013   Procedure: RIGHT KNEE ARTHROSCOPY WITH PARTIAL MEDIAL MENISECTOMY;  Surgeon: Reyes JAYSON Billing, MD;  Location: [facility];  Service: Orthopedics;  Laterality: Right;   SHOULDER ARTHROSCOPY WITH ROTATOR CUFF REPAIR Left 2001   TOTAL KNEE ARTHROPLASTY Right 05/14/2015   Procedure: REMOVAL OF HARDWARE AND RIGHT TOTAL KNEE ARTHROPLASTY;  Surgeon: Reyes Billing, MD;  Location: [facility];  Service: Orthopedics;  Laterality: Right;   ULNAR NERVE REPAIR Right 1996    Family History  Problem Relation Age of Onset   Arthritis Mother    Stroke Father    Early death Sister      Social Connections: Moderately Integrated (11/23/2022)   Social Connection and Isolation Panel [NHANES]    Frequency of Communication with Friends and Family: More than three times a week    Frequency of Social Gatherings with Friends and Family: Three times a week    Attends Religious Services: Never    Active Member of Clubs or Organizations: No    Attends Banker Meetings: 1 to 4 times per year    Marital Status: Married      Current Outpatient Medications:    azelastine  (ASTELIN ) 0.1 % nasal spray, Place 2 sprays into both nostrils 2 (two) times daily as needed for rhinitis. Use in each nostril as directed, Disp: 30 mL, Rfl: 12   aspirin  EC 81 MG tablet, Take 1 tablet (81 mg total) by mouth daily. Swallow whole., Disp: 90 tablet, Rfl: 1   escitalopram  (LEXAPRO ) 20 MG tablet, Take 1 tablet (20 mg total) by mouth daily., Disp: 90 tablet, Rfl: 3   gabapentin  (NEURONTIN ) 100 MG capsule, Take 1 capsule (100 mg total) by mouth 2 (two) times daily., Disp: 180 capsule, Rfl: 3   lisinopril  (ZESTRIL ) 40 MG tablet, Take 1 tablet (40 mg total) by mouth daily., Disp: 90 tablet, Rfl: 3   metFORMIN  (GLUCOPHAGE -XR) 500 MG 24 hr tablet, Take 1 tablet (500 mg total) by  mouth daily with breakfast., Disp: 90 tablet, Rfl: 3   rosuvastatin  (CRESTOR ) 10 MG tablet, Take 1 tablet (10 mg total) by mouth daily., Disp: 90 tablet, Rfl: 3   tadalafil (CIALIS) 5 MG tablet, Take 5 mg by mouth daily as needed for erectile dysfunction., Disp: , Rfl:    traZODone  (DESYREL ) 100 MG tablet, Take 1 tablet (100 mg total) by mouth at bedtime., Disp: 90 tablet, Rfl: 3   Physical Exam:   BP 134/86 (BP Location: Left Arm, Patient Position: Sitting, Cuff Size: Normal)   Pulse 69   SpO2 93%   Salient findings:  CN II-XII intact  Bilateral EAC clear and TM intact with well pneumatized middle ear spaces Anterior rhinoscopy: Septum dev left; nasal cavity does look bilateral inferior turbinates with R>L hypertrophy. Nasal endoscopy was indicated to better evaluate the nose and paranasal sinuses, given the patient's history and exam findings, and is detailed below. No lesions of oral cavity/oropharynx; dentition fair No obviously palpable neck masses/lymphadenopathy/thyromegaly No respiratory distress or stridor  Seprately Identifiable Procedures:  PROCEDURE: Bilateral  Diagnostic Rigid Nasal Endoscopy Pre-procedure diagnosis: Concern for nasal lesion Post-procedure diagnosis: same Indication: See pre-procedure diagnosis and physical exam above Complications: None apparent EBL: 0 mL Anesthesia: Lidocaine  4% and topical decongestant was topically sprayed in each nasal cavity  Description of Procedure:  Patient was identified. A rigid 30 degree endoscope was utilized to evaluate the sinonasal cavities, mucosa, sinus ostia and turbinates and septum.  Overall, signs of mucosal inflammation are not noted.  Modest L > R anterior dryness. No nasal lesions noted.  No mucopurulence, polyps, or masses noted.   Right Middle meatus: clear Right SE Recess: clear Left MM: clear Left SE Recess: clear No evidence of concerning nasal lesion today Photodocumentation was obtained.  CPT CODE --  68768 - Mod 25   Impression & Plans:  Nicholas Cooke is a 73 y.o. male with:  1. Nasal dryness   2. Rhinorrhea   3. Nasal lesion    Nasal cavity crusts and does seem relatively dry. Do not note any lesions on endo. He points to NV area for dryness/crusting. He's tried vicks and prior mupirocin and now some saline spray with some benefit. I do suspect that crusting is due to dryness, and we discussed improved humidification --- will start ponaris oil, and ayr gel He is also on PO anthistamine and we discussed switching to/trial astelin  spray (but will dry him out some) BID PRN in case of significant rhinorrhea.  See below regarding exact medications prescribed this encounter including dosages and route: Meds ordered this encounter  Medications   azelastine  (ASTELIN ) 0.1 % nasal spray    Sig: Place 2 sprays into both nostrils 2 (two) times daily as needed for rhinitis. Use in each nostril as directed    Dispense:  30 mL    Refill:  12      Thank you for allowing me the opportunity to care for your patient. Please do not hesitate to contact me should you have any other questions.  Sincerely, Nicholas Blanch, MD Otolaryngologist (ENT), [facility] Phone: [phone] Fax: [phone]  06/22/2023, 9:32 AM   MDM:  Level 4 - [phone] Complexity/Problems addressed: low - chronic problem Data complexity: mod - independent review and interpretation of notes, labs, imaging - Morbidity: mod  - Prescription Drug prescribed or managed: yes

## 2023-06-22 NOTE — Patient Instructions (Addendum)
 AYR gel - use it few times a day (especially at night) and in the morning.    You can use baby oil or ponaris oil in the nose 2-3 times per day (especially at night)   If the nose is very runny in the morning, use astelin  nasal spray (two sprays each nostril up to twice a day) At night after you eat, you can try using gaviscon ([employer]) -- see how the cough goes.

## 2023-06-26 ENCOUNTER — Encounter (HOSPITAL_BASED_OUTPATIENT_CLINIC_OR_DEPARTMENT_OTHER): Payer: Self-pay | Admitting: *Deleted

## 2023-06-27 ENCOUNTER — Encounter (HOSPITAL_BASED_OUTPATIENT_CLINIC_OR_DEPARTMENT_OTHER): Payer: Self-pay | Admitting: Family Medicine

## 2023-06-27 ENCOUNTER — Other Ambulatory Visit (HOSPITAL_BASED_OUTPATIENT_CLINIC_OR_DEPARTMENT_OTHER): Payer: Self-pay | Admitting: Family Medicine

## 2023-06-27 ENCOUNTER — Inpatient Hospital Stay: Payer: Medicare Other | Attending: Oncology | Admitting: Oncology

## 2023-06-27 ENCOUNTER — Encounter: Payer: Self-pay | Admitting: Oncology

## 2023-06-27 ENCOUNTER — Inpatient Hospital Stay: Payer: Medicare Other

## 2023-06-27 VITALS — BP 132/88 | HR 78 | Temp 98.1°F | Resp 18 | Wt 190.5 lb

## 2023-06-27 DIAGNOSIS — R55 Syncope and collapse: Secondary | ICD-10-CM | POA: Insufficient documentation

## 2023-06-27 DIAGNOSIS — R61 Generalized hyperhidrosis: Secondary | ICD-10-CM | POA: Insufficient documentation

## 2023-06-27 DIAGNOSIS — D72820 Lymphocytosis (symptomatic): Secondary | ICD-10-CM

## 2023-06-27 DIAGNOSIS — Z79899 Other long term (current) drug therapy: Secondary | ICD-10-CM | POA: Insufficient documentation

## 2023-06-27 DIAGNOSIS — R410 Disorientation, unspecified: Secondary | ICD-10-CM | POA: Insufficient documentation

## 2023-06-27 DIAGNOSIS — G8929 Other chronic pain: Secondary | ICD-10-CM

## 2023-06-27 LAB — CBC WITH DIFFERENTIAL (CANCER CENTER ONLY)
Abs Immature Granulocytes: 0.02 10*3/uL (ref 0.00–0.07)
Basophils Absolute: 0 10*3/uL (ref 0.0–0.1)
Basophils Relative: 1 %
Eosinophils Absolute: 0 10*3/uL (ref 0.0–0.5)
Eosinophils Relative: 1 %
HCT: 45.1 % (ref 39.0–52.0)
Hemoglobin: 15 g/dL (ref 13.0–17.0)
Immature Granulocytes: 0 %
Lymphocytes Relative: 44 %
Lymphs Abs: 3.7 10*3/uL (ref 0.7–4.0)
MCH: 28.5 pg (ref 26.0–34.0)
MCHC: 33.3 g/dL (ref 30.0–36.0)
MCV: 85.7 fL (ref 80.0–100.0)
Monocytes Absolute: 0.9 10*3/uL (ref 0.1–1.0)
Monocytes Relative: 10 %
Neutro Abs: 3.8 10*3/uL (ref 1.7–7.7)
Neutrophils Relative %: 44 %
Platelet Count: 232 10*3/uL (ref 150–400)
RBC: 5.26 MIL/uL (ref 4.22–5.81)
RDW: 14.6 % (ref 11.5–15.5)
WBC Count: 8.4 10*3/uL (ref 4.0–10.5)
nRBC: 0 % (ref 0.0–0.2)

## 2023-06-27 LAB — CMP (CANCER CENTER ONLY)
ALT: 13 U/L (ref 0–44)
AST: 18 U/L (ref 15–41)
Albumin: 4 g/dL (ref 3.5–5.0)
Alkaline Phosphatase: 46 U/L (ref 38–126)
Anion gap: 6 (ref 5–15)
BUN: 9 mg/dL (ref 8–23)
CO2: 27 mmol/L (ref 22–32)
Calcium: 9 mg/dL (ref 8.9–10.3)
Chloride: 104 mmol/L (ref 98–111)
Creatinine: 1.52 mg/dL — ABNORMAL HIGH (ref 0.61–1.24)
GFR, Estimated: 49 mL/min — ABNORMAL LOW (ref >60)
Glucose, Bld: 77 mg/dL (ref 70–99)
Potassium: 4.1 mmol/L (ref 3.5–5.1)
Sodium: 137 mmol/L (ref 135–145)
Total Bilirubin: 1.1 mg/dL (ref 0.0–1.2)
Total Protein: 7.4 g/dL (ref 6.5–8.1)

## 2023-06-27 LAB — C-REACTIVE PROTEIN: CRP: 0.6 mg/dL (ref <1.0)

## 2023-06-27 LAB — SEDIMENTATION RATE: Sed Rate: 11 mm/h (ref 0–16)

## 2023-06-27 LAB — LACTATE DEHYDROGENASE: LDH: 162 U/L (ref 98–192)

## 2023-06-27 NOTE — Telephone Encounter (Signed)
Jon Gills, please see mychart sent by pt and advise if you are okay with having this referral placed.

## 2023-06-27 NOTE — Assessment & Plan Note (Addendum)
Elevated lymphocyte count in December (4900 cells/L) and July (4000 cells/L). Normal total white count, red count, and platelet count.   Repeat labs today showed white count of 8400 with normal differential, including normal lymphocyte count of 3700.  ANC normal at 3800.  Hemoglobin 15, platelet count normal at 232,000.  Creatinine 1.52, stable for him.  Otherwise unremarkable CMP.  LDH normal.  Will obtain flow cytometry of peripheral blood for further evaluation.  Will also check ESR and CRP today.  Differential diagnosis includes monoclonal B-cell lymphocytosis and probably developing chronic lymphocytic leukemia (CLL). No significant weight loss, fever, chills, or lymphadenopathy. Night sweats reported.  Even if diagnosed with CLL, explained that CLL is a mild leukemia often not requiring treatment unless other blood counts are affected or symptoms worsen. Lymphocyte count needs to be consistently above 5000 cells/L for CLL diagnosis. Treatment is not initiated unless anemia, low platelets, or significant symptoms develop. Discussed non-hereditary nature of most leukemias and importance of monitoring.    Discussed the importance of avoiding self-diagnosis through internet searches and the non-hereditary nature of most leukemias.    Provided reassurance and education about the nature of lymphocytosis  - Schedule follow-up phone call in two weeks to discuss results   - Schedule in-person follow-up in three months for repeat labs and evaluation

## 2023-06-27 NOTE — Progress Notes (Signed)
[facility]  HEMATOLOGY CLINIC CONSULTATION NOTE   PATIENT NAMEDonta Cooke   MR#: [id number] DOB: [birth year]  DATE OF SERVICE: 06/27/2023   REFERRING PHYSICIAN  Caudle, Shelton Silvas, FNP   Patient Care Team: Hilbert Bible, FNP as PCP - General (Family Medicine) Van Clines, MD as Consulting Physician (Neurology) [facility], [facility]   REASON FOR CONSULTATION/ CHIEF COMPLAINT:  Lymphocytosis  ASSESSMENT & PLAN:  Nicholas Cooke is a 73 y.o. gentleman with a past medical history of hypertension, dyslipidemia, CKD, chronic diverticulosis, was referred to our service for evaluation of lymphocytosis.    Lymphocytosis Elevated lymphocyte count in December (4900 cells/L) and July (4000 cells/L). Normal total white count, red count, and platelet count.   Repeat labs today showed white count of 8400 with normal differential, including normal lymphocyte count of 3700.  ANC normal at 3800.  Hemoglobin 15, platelet count normal at 232,000.  Creatinine 1.52, stable for him.  Otherwise unremarkable CMP.  LDH normal.  Will obtain flow cytometry of peripheral blood for further evaluation.  Will also check ESR and CRP today.  Differential diagnosis includes monoclonal B-cell lymphocytosis and probably developing chronic lymphocytic leukemia (CLL). No significant weight loss, fever, chills, or lymphadenopathy. Night sweats reported.  Even if diagnosed with CLL, explained that CLL is a mild leukemia often not requiring treatment unless other blood counts are affected or symptoms worsen. Lymphocyte count needs to be consistently above 5000 cells/L for CLL diagnosis. Treatment is not initiated unless anemia, low platelets, or significant symptoms develop. Discussed non-hereditary nature of most leukemias and importance of monitoring.    Discussed the importance of avoiding self-diagnosis through internet searches and the non-hereditary nature of most  leukemias.    Provided reassurance and education about the nature of lymphocytosis  - Schedule follow-up phone call in two weeks to discuss results   - Schedule in-person follow-up in three months for repeat labs and evaluation    Pre-syncope Episode of disorientation and near-fainting last week, likely secondary to dehydration from diarrhea. No loss of consciousness. Previous similar episode resolved with hydration. No history of seizures or strokes. Symptoms included visual disturbances and difficulty focusing. Discussed that dehydration and electrolyte imbalance from diarrhea can cause presyncope. Advised to seek immediate care if symptoms recur.     I reviewed lab results and outside records for this visit and discussed relevant results with the patient. Diagnosis, plan of care and treatment options were also discussed in detail with the patient. Opportunity provided to ask questions and answers provided to his apparent satisfaction. Provided instructions to call our clinic with any problems, questions or concerns prior to return visit. I recommended to continue follow-up with PCP and sub-specialists. He verbalized understanding and agreed with the plan. No barriers to learning was detected.  [name], MD  06/27/2023 3:49 PM  [facility] - A DE[facility] OF [facility] [address] [place] [address] Dept: [phone] Dept Fax: [phone]   HISTORY OF PRESENT ILLNESS:  Discussed the use of AI scribe software for clinical note transcription with the patient, who gave verbal consent to proceed.   Labs at his PCPs office on 05/08/2023 showed white count of 9400 with absolute lymphocyte count increased at 4900, ANC of 3700, otherwise normal differential.  Hemoglobin was normal at 16.4, platelet count normal at 260,000.  Previously labs in July 2024 also showed mildly elevated lymphocyte count of 3900 when  the white  count was normal at 8400.  Given persistent lymphocytosis, referral was sent to [place] for further evaluation.  The patient was not previously aware of this abnormality. The lymphocyte count was 4900, slightly above the normal upper limit of 4000. The patient's total white count, red count, and platelet count were all within normal limits. The patient's lymphocyte count has been slightly elevated since July of the previous year.  In the past couple of months, the patient has noticed a decrease in appetite and has been eating less frequently. The patient also reported experiencing night sweats, with episodes of waking up with a wet back and pajamas. The patient denied any swelling in the neck or underarm area. The patient also reported an episode of falling due to massive disorientation and inability to focus, which lasted for about half an hour. The patient did not lose consciousness during this episode. The patient also reported having diarrhea five to six times in one day prior to the episode of falling. The patient denied any history of seizures or strokes.  He denies fever, cough, diarrhea, or other infectious symptoms.  He denies epistaxis, bloody stool, melena, hematuria, bruising or other bleeding symptoms. He also denies unintentional weight loss, night sweats or other constitutional symptoms.  MEDICAL HISTORY Past Medical History:  Diagnosis Date   Chronic kidney disease    Dental crowns present    Depression    Enlarged prostate    Hypertension    states under control with med., has been on med. x 2 yr.   Inguinal hernia 12/2015   Osteoarthritis      SURGICAL HISTORY Past Surgical History:  Procedure Laterality Date   APPENDECTOMY  yrs ago   CYSTOSCOPY WITH INSERTION OF UROLIFT N/A 07/03/2018   Procedure: CYSTOSCOPY WITH INSERTION OF UROLIFT;  Surgeon: Jerilee Field, MD;  Location: [facility];  Service: Urology;  Laterality: N/A;  ONLY NEEDS 30 MIN FOR PROCEDURE    HERNIA REPAIR  2014   INGUINAL HERNIA REPAIR  02/16/2012   Procedure: HERNIA REPAIR INGUINAL ADULT;  Surgeon: Shelly Rubenstein, MD;  Location: [facility];  Service: General;  Laterality: Left;  left inguinal hernia repair with mesh   INGUINAL HERNIA REPAIR Bilateral 12/23/2015   Procedure: LAPAROSCOPIC LEFT INGUINAL HERNIA WITH MESH;  Surgeon: Abigail Miyamoto, MD;  Location: [facility];  Service: General;  Laterality: Bilateral;   INSERTION OF MESH Bilateral 12/23/2015   Procedure: INSERTION OF MESH;  Surgeon: Abigail Miyamoto, MD;  Location: [facility];  Service: General;  Laterality: Bilateral;   JOINT REPLACEMENT     KNEE ARTHROSCOPY WITH MEDIAL MENISECTOMY Right 10/18/2013   Procedure: RIGHT KNEE ARTHROSCOPY WITH PARTIAL MEDIAL MENISECTOMY;  Surgeon: Javier Docker, MD;  Location: [facility];  Service: Orthopedics;  Laterality: Right;   SHOULDER ARTHROSCOPY WITH ROTATOR CUFF REPAIR Left 2001   TOTAL KNEE ARTHROPLASTY Right 05/14/2015   Procedure: REMOVAL OF HARDWARE AND RIGHT TOTAL KNEE ARTHROPLASTY;  Surgeon: Jene Every, MD;  Location: [facility];  Service: Orthopedics;  Laterality: Right;   ULNAR NERVE REPAIR Right 1996     SOCIAL HISTORY: He reports that he has never smoked. He has never used smokeless tobacco. He reports that he does not drink alcohol and does not use drugs. Social History   Socioeconomic History   Marital status: Married    Spouse name: Not on file   Number of children: Not on file   Years of education: Not on  file   Highest education level: Associate degree: occupational, Scientist, product/process development, or vocational program  Occupational History   Occupation: retired  Tobacco Use   Smoking status: Never   Smokeless tobacco: Never  Vaping Use   Vaping status: Never Used  Substance and Sexual Activity   Alcohol use: No   Drug use: No   Sexual activity: Not Currently    Birth control/protection: None  Other Topics Concern   Not on  file  Social History Narrative   Right handed   Drinks caffeine   Two story home   Married   Retired from working for [employer]    Social Drivers of [employer]: Low Risk  (11/23/2022)   Overall Financial Resource Strain (CARDIA)    Difficulty of Paying Living Expenses: Not very hard  Food Insecurity: No Food Insecurity (11/23/2022)   Hunger Vital Sign    Worried About Running Out of Food in the Last Year: Never true    Ran Out of Food in the Last Year: Never true  Transportation Needs: No Transportation Needs (11/23/2022)   PRAPARE - Administrator, Civil Service (Medical): No    Lack of Transportation (Non-Medical): No  Physical Activity: Insufficiently Active (11/23/2022)   Exercise Vital Sign    Days of Exercise per Week: 3 days    Minutes of Exercise per Session: 10 min  Stress: No Stress Concern Present (11/23/2022)   [employer] of Occupational Health - Occupational Stress Questionnaire    Feeling of Stress : Only a little  Social Connections: Moderately Integrated (11/23/2022)   Social Connection and Isolation Panel [NHANES]    Frequency of Communication with Friends and Family: More than three times a week    Frequency of Social Gatherings with Friends and Family: Three times a week    Attends Religious Services: Never    Active Member of Clubs or Organizations: No    Attends Banker Meetings: 1 to 4 times per year    Marital Status: Married  Catering manager Violence: Not At Risk (07/12/2022)   Humiliation, Afraid, Rape, and Kick questionnaire    Fear of Current or Ex-Partner: No    Emotionally Abused: No    Physically Abused: No    Sexually Abused: No    FAMILY HISTORY: His family history includes Arthritis in his mother; Early death in his sister; Stroke in his father.  CURRENT MEDICATIONS   Current Outpatient Medications  Medication Instructions   aspirin EC 81 mg, Oral, Daily, Swallow whole.    azelastine (ASTELIN) 0.1 % nasal spray 2 sprays, Each Nare, 2 times daily PRN, Use in each nostril as directed   escitalopram (LEXAPRO) 20 mg, Oral, Daily   gabapentin (NEURONTIN) 100 mg, Oral, 2 times daily   lisinopril (ZESTRIL) 40 mg, Oral, Daily   metFORMIN (GLUCOPHAGE-XR) 500 mg, Oral, Daily with breakfast   rosuvastatin (CRESTOR) 10 mg, Oral, Daily   tadalafil (CIALIS) 5 mg, Daily PRN   traZODone (DESYREL) 100 mg, Oral, Daily at bedtime     ALLERGIES  He has no known allergies.  REVIEW OF SYSTEMS:  Review of Systems - Oncology   Rest of the pertinent review of systems is unremarkable except as mentioned above in HPI.  PHYSICAL EXAMINATION:    Onc Performance Status - 06/27/23 1410       ECOG Perf Status   ECOG Perf Status Fully active, able to carry on all pre-disease performance without restriction  KPS SCALE   KPS % SCORE Normal, no compliants, no evidence of disease             Vitals:   06/27/23 1353  BP: 132/88  Pulse: 78  Resp: 18  Temp: 98.1 F (36.7 C)  SpO2: 98%   Filed Weights   06/27/23 1353  Weight: 190 lb 8 oz (86.4 kg)    Physical Exam Constitutional:      General: He is not in acute distress.    Appearance: Normal appearance.  HENT:     Head: Normocephalic and atraumatic.  Eyes:     General: No scleral icterus.    Conjunctiva/sclera: Conjunctivae normal.  Cardiovascular:     Rate and Rhythm: Normal rate and regular rhythm.     Heart sounds: Normal heart sounds.  Pulmonary:     Effort: Pulmonary effort is normal.     Breath sounds: Normal breath sounds.  Abdominal:     General: There is no distension.  Musculoskeletal:     Right lower leg: No edema.     Left lower leg: No edema.  Neurological:     General: No focal deficit present.     Mental Status: He is alert and oriented to person, place, and time.  Psychiatric:        Mood and Affect: Mood normal.        Behavior: Behavior normal.        Thought Content:  Thought content normal.    LABORATORY DATA:   I have reviewed the data as listed.  Results for orders placed or performed in visit on 06/27/23  Lactate dehydrogenase  Result Value Ref Range   LDH 162 98 - 192 U/L  CMP (Cancer Center only)  Result Value Ref Range   Sodium 137 135 - 145 mmol/L   Potassium 4.1 3.5 - 5.1 mmol/L   Chloride 104 98 - 111 mmol/L   CO2 27 22 - 32 mmol/L   Glucose, Bld 77 70 - 99 mg/dL   BUN 9 8 - 23 mg/dL   Creatinine 4.09 (H) 8.11 - 1.24 mg/dL   Calcium 9.0 8.9 - 91.4 mg/dL   Total Protein 7.4 6.5 - 8.1 g/dL   Albumin 4.0 3.5 - 5.0 g/dL   AST 18 15 - 41 U/L   ALT 13 0 - 44 U/L   Alkaline Phosphatase 46 38 - 126 U/L   Total Bilirubin 1.1 0.0 - 1.2 mg/dL   GFR, Estimated 49 (L) >60 mL/min   Anion gap 6 5 - 15  CBC with Differential (Cancer Center Only)  Result Value Ref Range   WBC Count 8.4 4.0 - 10.5 K/uL   RBC 5.26 4.22 - 5.81 MIL/uL   Hemoglobin 15.0 13.0 - 17.0 g/dL   HCT 78.2 95.6 - 21.3 %   MCV 85.7 80.0 - 100.0 fL   MCH 28.5 26.0 - 34.0 pg   MCHC 33.3 30.0 - 36.0 g/dL   RDW 08.6 57.8 - 46.9 %   Platelet Count 232 150 - 400 K/uL   nRBC 0.0 0.0 - 0.2 %   Neutrophils Relative % 44 %   Neutro Abs 3.8 1.7 - 7.7 K/uL   Lymphocytes Relative 44 %   Lymphs Abs 3.7 0.7 - 4.0 K/uL   Monocytes Relative 10 %   Monocytes Absolute 0.9 0.1 - 1.0 K/uL   Eosinophils Relative 1 %   Eosinophils Absolute 0.0 0.0 - 0.5 K/uL   Basophils Relative 1 %  Basophils Absolute 0.0 0.0 - 0.1 K/uL   Immature Granulocytes 0 %   Abs Immature Granulocytes 0.02 0.00 - 0.07 K/uL    RADIOGRAPHIC STUDIES:  No pertinent imaging studies available to review.  Orders Placed This Encounter  Procedures   CBC with Differential (Cancer Center Only)    Standing Status:   Future    Number of Occurrences:   1    Expiration Date:   06/26/2024   CMP (Cancer Center only)    Standing Status:   Future    Number of Occurrences:   1    Expiration Date:   06/26/2024   Lactate  dehydrogenase    Standing Status:   Future    Number of Occurrences:   1    Expiration Date:   06/26/2024   Sedimentation rate    Standing Status:   Future    Number of Occurrences:   1    Expiration Date:   06/26/2024   C-reactive protein    Standing Status:   Future    Number of Occurrences:   1    Expiration Date:   06/26/2024   Flow Cytometry, Peripheral Blood (Oncology)    Standing Status:   Future    Number of Occurrences:   1    Expiration Date:   06/26/2024    Future Appointments  Date Time Provider Department Center  06/30/2023 10:30 AM Hilbert Bible, FNP [facility]  07/11/2023 11:00 AM [name], MD [facility] None  09/08/2023  8:10 AM Hilbert Bible, FNP [facility]  09/08/2023  8:40 AM [facility] PRIMARY CARE CLINICAL SUPPORT [facility]  09/25/2023 10:45 AM [facility] PHLEBOTOMIST [facility] None  09/25/2023 11:20 AM Dann Ventress, Archie Patten, MD [facility] None    I spent a total of 55 minutes during this encounter with the patient including review of chart and various tests results, discussions about plan of care and coordination of care plan.  This document was completed utilizing speech recognition software. Grammatical errors, random word insertions, pronoun errors, and incomplete sentences are an occasional consequence of this system due to software limitations, ambient noise, and hardware issues. Any formal questions or concerns about the content, text or information contained within the body of this dictation should be directly addressed to the provider for clarification.

## 2023-06-27 NOTE — Assessment & Plan Note (Signed)
Episode of disorientation and near-fainting last week, likely secondary to dehydration from diarrhea. No loss of consciousness. Previous similar episode resolved with hydration. No history of seizures or strokes. Symptoms included visual disturbances and difficulty focusing. Discussed that dehydration and electrolyte imbalance from diarrhea can cause presyncope. Advised to seek immediate care if symptoms recur.

## 2023-06-29 LAB — SURGICAL PATHOLOGY

## 2023-06-30 ENCOUNTER — Encounter: Payer: Self-pay | Admitting: Physical Medicine and Rehabilitation

## 2023-06-30 ENCOUNTER — Encounter (HOSPITAL_BASED_OUTPATIENT_CLINIC_OR_DEPARTMENT_OTHER): Payer: Self-pay | Admitting: Family Medicine

## 2023-06-30 ENCOUNTER — Ambulatory Visit (INDEPENDENT_AMBULATORY_CARE_PROVIDER_SITE_OTHER): Payer: Medicare Other | Admitting: Family Medicine

## 2023-06-30 VITALS — BP 127/77 | HR 84 | Ht 69.0 in | Wt 195.0 lb

## 2023-06-30 DIAGNOSIS — W19XXXA Unspecified fall, initial encounter: Secondary | ICD-10-CM

## 2023-06-30 DIAGNOSIS — N1832 Chronic kidney disease, stage 3b: Secondary | ICD-10-CM | POA: Diagnosis not present

## 2023-06-30 DIAGNOSIS — I10 Essential (primary) hypertension: Secondary | ICD-10-CM | POA: Diagnosis not present

## 2023-06-30 DIAGNOSIS — M47817 Spondylosis without myelopathy or radiculopathy, lumbosacral region: Secondary | ICD-10-CM | POA: Insufficient documentation

## 2023-06-30 DIAGNOSIS — G8929 Other chronic pain: Secondary | ICD-10-CM | POA: Insufficient documentation

## 2023-06-30 DIAGNOSIS — M4727 Other spondylosis with radiculopathy, lumbosacral region: Secondary | ICD-10-CM | POA: Insufficient documentation

## 2023-06-30 LAB — FLOW CYTOMETRY

## 2023-06-30 NOTE — Progress Notes (Signed)
Subjective:   Nicholas Cooke July 24, 1950 06/30/2023  Chief Complaint  Patient presents with   Hypertension    6-week follow up for BP check; denies any main concerns for today's visit.    HPI: Nicholas Cooke presents today for re-assessment and management of chronic medical conditions.  HYPERTENSION: Nicholas Cooke presents for the medical management of hypertension.  Patient's current hypertension medication regimen is: Lisinopril 40mg  Patient is  currently taking prescribed medications for HTN.  Patient is  regularly keeping a check on BP at home.  Adhering to low sodium diet: Yes Exercising Regularly: Not currently due to back pain Denies headache, dizziness, CP, SHOB, vision changes.   BP Readings from Last 3 Encounters:  06/30/23 127/77  06/27/23 132/88  06/22/23 134/86    Recent Falls:  Patient states he had mild dehydration with diarrhea approx. 2 weeks ago and had 2 falls hitting his right buttock and right head with near syncopal episode. He states he drank gatorade and symptoms lessened within the hour. Reports returning to baseline within the hour. He denies chest pain, palpitations, shortness of breath,  headache, laceration, blurred vision, dizziness. Reports a similar episode approx. 15 years ago that occurred while sick and dehyrated. He is not on anticoagulant therapy.   The following portions of the patient's history were reviewed and updated as appropriate: past medical history, past surgical history, family history, social history, allergies, medications, and problem list.   Patient Active Problem List   Diagnosis Date Noted   Lumbosacral spondylosis with radiculopathy 06/30/2023   Other chronic pain 06/30/2023   Lumbosacral spondylosis without myelopathy 06/30/2023   Lymphocytosis 06/27/2023   Pre-syncope 06/27/2023   Prediabetes 05/19/2023   Acute bilateral low back pain without sciatica 11/24/2022   Benign prostatic hyperplasia without lower urinary  tract symptoms 05/17/2022   Stage 3b chronic kidney disease (HCC) 05/17/2022   Diverticulosis 05/17/2022   Hyperlipidemia 05/17/2022   Depression, recurrent (HCC) 05/17/2022   Essential hypertension 06/15/2020   Bilateral impacted cerumen 03/15/2018   Rhinitis 03/15/2018   Tinnitus aurium, bilateral 03/15/2018   Osteoarthritis of right knee 05/14/2015   Right knee DJD 05/14/2015   Hemiplegia affecting nondominant side (HCC) 06/13/2013   Left inguinal hernia 01/24/2012   Past Medical History:  Diagnosis Date   Chronic kidney disease    Dental crowns present    Depression    Enlarged prostate    Hypertension    states under control with med., has been on med. x 2 yr.   Inguinal hernia 12/2015   Osteoarthritis    Past Surgical History:  Procedure Laterality Date   APPENDECTOMY  yrs ago   CYSTOSCOPY WITH INSERTION OF UROLIFT N/A 07/03/2018   Procedure: CYSTOSCOPY WITH INSERTION OF UROLIFT;  Surgeon: Jerilee Field, MD;  Location: [facility];  Service: Urology;  Laterality: N/A;  ONLY NEEDS 30 MIN FOR PROCEDURE   HERNIA REPAIR  2014   INGUINAL HERNIA REPAIR  02/16/2012   Procedure: HERNIA REPAIR INGUINAL ADULT;  Surgeon: Shelly Rubenstein, MD;  Location: [facility];  Service: General;  Laterality: Left;  left inguinal hernia repair with mesh   INGUINAL HERNIA REPAIR Bilateral 12/23/2015   Procedure: LAPAROSCOPIC LEFT INGUINAL HERNIA WITH MESH;  Surgeon: Abigail Miyamoto, MD;  Location: [facility];  Service: General;  Laterality: Bilateral;   INSERTION OF MESH Bilateral 12/23/2015   Procedure: INSERTION OF MESH;  Surgeon: Abigail Miyamoto, MD;  Location: [facility];  Service: General;  Laterality: Bilateral;   JOINT REPLACEMENT     KNEE ARTHROSCOPY WITH MEDIAL MENISECTOMY Right 10/18/2013   Procedure: RIGHT KNEE ARTHROSCOPY WITH PARTIAL MEDIAL MENISECTOMY;  Surgeon: Javier Docker, MD;  Location: [facility];  Service:  Orthopedics;  Laterality: Right;   SHOULDER ARTHROSCOPY WITH ROTATOR CUFF REPAIR Left 2001   TOTAL KNEE ARTHROPLASTY Right 05/14/2015   Procedure: REMOVAL OF HARDWARE AND RIGHT TOTAL KNEE ARTHROPLASTY;  Surgeon: Jene Every, MD;  Location: [facility];  Service: Orthopedics;  Laterality: Right;   ULNAR NERVE REPAIR Right 1996   Family History  Problem Relation Age of Onset   Arthritis Mother    Stroke Father    Early death Sister    Outpatient Medications Prior to Visit  Medication Sig Dispense Refill   aspirin EC 81 MG tablet Take 1 tablet (81 mg total) by mouth daily. Swallow whole. 90 tablet 1   azelastine (ASTELIN) 0.1 % nasal spray Place 2 sprays into both nostrils 2 (two) times daily as needed for rhinitis. Use in each nostril as directed 30 mL 12   escitalopram (LEXAPRO) 20 MG tablet Take 1 tablet (20 mg total) by mouth daily. 90 tablet 3   gabapentin (NEURONTIN) 300 MG capsule Take 300 mg by mouth 2 (two) times daily.     lisinopril (ZESTRIL) 40 MG tablet Take 1 tablet (40 mg total) by mouth daily. 90 tablet 3   metFORMIN (GLUCOPHAGE-XR) 500 MG 24 hr tablet Take 1 tablet (500 mg total) by mouth daily with breakfast. 90 tablet 3   rosuvastatin (CRESTOR) 10 MG tablet Take 1 tablet (10 mg total) by mouth daily. 90 tablet 3   tadalafil (CIALIS) 5 MG tablet Take 5 mg by mouth daily as needed for erectile dysfunction.     traZODone (DESYREL) 100 MG tablet Take 1 tablet (100 mg total) by mouth at bedtime. 90 tablet 3   gabapentin (NEURONTIN) 100 MG capsule Take 1 capsule (100 mg total) by mouth 2 (two) times daily. 180 capsule 3   No facility-administered medications prior to visit.   No Known Allergies   ROS: A complete ROS was performed with pertinent positives/negatives noted in the HPI. The remainder of the ROS are negative.    Objective:   Today's Vitals   06/30/23 1026  BP: 127/77  Pulse: 84  SpO2: 95%  Weight: 195 lb (88.5 kg)  Height: 5\' 9"  (1.753 m)    Physical Exam           GENERAL: Well-appearing, in NAD. Well nourished.  SKIN: Pink, warm and dry. No rash, lesion, ulceration, or ecchymoses.  Head: Normocephalic. No palpable hematoma to scalp.  NECK: Trachea midline. Full ROM w/o pain or tenderness. EYES: Conjunctiva clear without exudates. EOMI, PERRL, no drainage present.  NOSE: Septum midline w/o deformity. Nares patent, mucosa pink and non-inflamed w/o drainage. No sinus tenderness.  THROAT: Uvula midline. Oropharynx clear. Mucous membranes pink and moist.  RESPIRATORY: Chest wall symmetrical. Respirations even and non-labored. Breath sounds clear to auscultation bilaterally.  CARDIAC: S1, S2 present, regular rate and rhythm without murmur or gallops. Peripheral pulses 2+ bilaterally.  MSK: Muscle tone and strength appropriate for age. Back brace in place.  EXTREMITIES: Without clubbing, cyanosis, or edema.  NEUROLOGIC: No motor or sensory deficits. Steady, even gait. C2-C12 intact.  PSYCH/MENTAL STATUS: Alert, oriented x 3. Cooperative, appropriate mood and affect.      Assessment & Plan:  1. Essential hypertension (Primary) Controlled. Continue Lisinopril 40mg  as directed. BP well controlled with  d/c of hydrochlorothiazide. Pt will continue to monitor regularly at home. Recent labs reviewed from 06/27/2023 w/ Oncology.   2. Stage 3b chronic kidney disease (HCC) Stable. Improvement present after dc of hydrochlorothiazide. Continue Lisinopril as directed and avoidance of NSAIDs. Repeat labs in 4 months.   3. Fall, initial encounter Patient denies residual symptoms or injury from his fall 2 weeks ago. Exam is unremarkable. Discussed clear hydration, slow rising from sitting/lying positions, and monitor his HR and BP regularly. If fall occurs , dizziness, chest pain, palpitations or SHOB occur, will return to PCP.   Return if symptoms worsen or fail to improve.    Patient to reach out to office if new, worrisome, or unresolved symptoms arise or  if no improvement in patient's condition. Patient verbalized understanding and is agreeable to treatment plan. All questions answered to patient's satisfaction.    Hilbert Bible, [place]

## 2023-07-11 ENCOUNTER — Encounter: Payer: Self-pay | Admitting: Oncology

## 2023-07-11 ENCOUNTER — Inpatient Hospital Stay (HOSPITAL_BASED_OUTPATIENT_CLINIC_OR_DEPARTMENT_OTHER): Payer: Medicare Other | Admitting: Oncology

## 2023-07-11 DIAGNOSIS — D72820 Lymphocytosis (symptomatic): Secondary | ICD-10-CM

## 2023-07-11 NOTE — Progress Notes (Signed)
 [facility]  HEMATOLOGY-ONCOLOGY ELECTRONIC VISIT PROGRESS NOTE  PATIENT NAME: Nicholas Cooke   MR#: [id number] DOB: 1951-02-06  DATE OF SERVICE: 07/11/2023  Patient Care Team: Hilbert Bible, FNP as PCP - General (Family Medicine) Karel Jarvis Lesle Chris, MD as Consulting Physician (Neurology) [facility], [facility]  I connected with the patient via telephone conference and verified that I am speaking with the correct person using two identifiers. The patient's location is at home and I am providing care from the [facility].  I discussed the limitations, risks, security and privacy concerns of performing an evaluation and management service by e-visits and the availability of in person appointments.  I also discussed with the patient that there may be a patient responsible charge related to this service. The patient expressed understanding and agreed to proceed.   ASSESSMENT & PLAN:   Nicholas Cooke is a 73 y.o. 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    Lymphocytosis Elevated lymphocyte count in December (4900 cells/L) and July (4000 cells/L). Normal total white count, red count, and platelet count.   On his initial consultation with [place] on 06/27/2023, labs showed white count of 8400 with normal differential, including normal lymphocyte count of 3700.  ANC normal at 3800.  Hemoglobin 15, platelet count normal at 232,000.  Creatinine 1.52, stable for him.  Otherwise unremarkable CMP.  LDH, ESR, CRP were all normal.  Flow cytometry of peripheral blood was unremarkable.   No significant weight loss, fever, chills, or lymphadenopathy. Night sweats reported.  Clinical picture not concerning for CLL or other lymphoproliferative process.     Provided reassurance and education about the nature of lymphocytosis.  - Schedule in-person  follow-up in three months for repeat labs and evaluation.  If stable blood counts, he can be discharged from our office after next visit.   I discussed the assessment and treatment plan with the patient. The patient was provided an opportunity to ask questions and all were answered. The patient agreed with the plan and demonstrated an understanding of the instructions. The patient was advised to call back or seek an in-person evaluation if the symptoms worsen or if the condition fails to improve as anticipated.    I spent 11 minutes over the phone with the patient reviewing test results, discuss management and coordination/planning of care.  [name], MD 07/11/2023 10:59 AM [facility] - A DE[facility] [address] [place] [address] Dept: [phone] Dept Fax: [phone]   INTERVAL HISTORY:  Please see above for problem oriented charting.  The purpose of today's discussion is to explain recent lab results and to formulate plan of care.  He has been doing well overall and denies any new complaints compared to last visit.  No more syncopal episodes or presyncopal episodes.  SUMMARY OF HEMATOLOGY HISTORY:  Labs at his PCPs office on 05/08/2023 showed white count of 9400 with absolute lymphocyte count increased at 4900, ANC of 3700, otherwise normal differential.  Hemoglobin was normal at 16.4, platelet count normal at 260,000.  Previously labs in July 2024 also showed mildly elevated lymphocyte count of 3900 when the white count was normal at 8400.  Given persistent lymphocytosis, referral was sent to [place] for further evaluation.   The patient was not previously aware of this abnormality. The lymphocyte count was 4900,  slightly above the normal upper limit of 4000. The patient's total white count, red count, and platelet count were all within normal limits. The patient's lymphocyte count has been  slightly elevated since July of the previous year.   In the past couple of months, the patient has noticed a decrease in appetite and has been eating less frequently. The patient also reported experiencing night sweats, with episodes of waking up with a wet back and pajamas. The patient denied any swelling in the neck or underarm area. The patient also reported an episode of falling due to massive disorientation and inability to focus, which lasted for about half an hour. The patient did not lose consciousness during this episode. The patient also reported having diarrhea five to six times in one day prior to the episode of falling. The patient denied any history of seizures or strokes.   He denied fever, cough, diarrhea, or other infectious symptoms.  He denied epistaxis, bloody stool, melena, hematuria, bruising or other bleeding symptoms. He also denies unintentional weight loss, night sweats or other constitutional symptoms.  On his initial consultation with [place] on 06/27/2023, labs showed white count of 8400 with normal differential, including normal lymphocyte count of 3700.  ANC normal at 3800.  Hemoglobin 15, platelet count normal at 232,000.  Creatinine 1.52, stable for him.  Otherwise unremarkable CMP.  LDH, ESR, CRP were all normal.  Flow cytometry of peripheral blood was unremarkable.   No significant weight loss, fever, chills, or lymphadenopathy. Night sweats reported.  Clinical picture not concerning for CLL or other lymphoproliferative process.     Provided reassurance and education about the nature of lymphocytosis.    REVIEW OF SYSTEMS:    Review of Systems - Oncology  All other pertinent systems were reviewed with the patient and are negative.  I have reviewed the past medical history, past surgical history, social history and family history with the patient and they are unchanged from previous note.  ALLERGIES:  He has no known allergies.  MEDICATIONS:  Current Outpatient  Medications  Medication Sig Dispense Refill   aspirin EC 81 MG tablet Take 1 tablet (81 mg total) by mouth daily. Swallow whole. 90 tablet 1   azelastine (ASTELIN) 0.1 % nasal spray Place 2 sprays into both nostrils 2 (two) times daily as needed for rhinitis. Use in each nostril as directed 30 mL 12   escitalopram (LEXAPRO) 20 MG tablet Take 1 tablet (20 mg total) by mouth daily. 90 tablet 3   gabapentin (NEURONTIN) 300 MG capsule Take 300 mg by mouth 2 (two) times daily.     lisinopril (ZESTRIL) 40 MG tablet Take 1 tablet (40 mg total) by mouth daily. 90 tablet 3   metFORMIN (GLUCOPHAGE-XR) 500 MG 24 hr tablet Take 1 tablet (500 mg total) by mouth daily with breakfast. 90 tablet 3   rosuvastatin (CRESTOR) 10 MG tablet Take 1 tablet (10 mg total) by mouth daily. 90 tablet 3   tadalafil (CIALIS) 5 MG tablet Take 5 mg by mouth daily as needed for erectile dysfunction.     traZODone (DESYREL) 100 MG tablet Take 1 tablet (100 mg total) by mouth at bedtime. 90 tablet 3   No current facility-administered medications for this visit.    PHYSICAL EXAMINATION:    Onc Performance Status - 07/11/23 1000       ECOG Perf Status   ECOG Perf Status Fully active, able to carry on all pre-disease performance without restriction  KPS SCALE   KPS % SCORE Normal, no compliants, no evidence of disease             LABORATORY DATA:   I have reviewed the data as listed.  Recent Results (from the past 2160 hours)  Hemoglobin A1c     Status: Abnormal   Collection Time: 05/08/23  8:56 AM  Result Value Ref Range   Hgb A1c MFr Bld 6.1 (H) 4.8 - 5.6 %    Comment:          Prediabetes: 5.7 - 6.4          Diabetes: >6.4          Glycemic control for adults with diabetes: <7.0    Est. average glucose Bld gHb Est-mCnc 128 mg/dL  CBC with Differential/Platelet     Status: Abnormal   Collection Time: 05/08/23  8:56 AM  Result Value Ref Range   WBC 9.4 3.4 - 10.8 x10E3/uL   RBC 5.42 4.14 - 5.80  x10E6/uL   Hemoglobin 16.4 13.0 - 17.7 g/dL   Hematocrit 16.1 09.6 - 51.0 %   MCV 87 79 - 97 fL   MCH 30.3 26.6 - 33.0 pg   MCHC 34.8 31.5 - 35.7 g/dL   RDW 04.5 40.9 - 81.1 %   Platelets 260 150 - 450 x10E3/uL   Neutrophils 39 Not Estab. %   Lymphs 51 Not Estab. %   Monocytes 9 Not Estab. %   Eos 1 Not Estab. %   Basos 0 Not Estab. %   Neutrophils Absolute 3.7 1.4 - 7.0 x10E3/uL   Lymphocytes Absolute 4.9 (H) 0.7 - 3.1 x10E3/uL   Monocytes Absolute 0.8 0.1 - 0.9 x10E3/uL   EOS (ABSOLUTE) 0.1 0.0 - 0.4 x10E3/uL   Basophils Absolute 0.0 0.0 - 0.2 x10E3/uL   Immature Granulocytes 0 Not Estab. %   Immature Grans (Abs) 0.0 0.0 - 0.1 x10E3/uL  Comprehensive metabolic panel     Status: Abnormal   Collection Time: 05/08/23  8:56 AM  Result Value Ref Range   Glucose 111 (H) 70 - 99 mg/dL   BUN 14 8 - 27 mg/dL   Creatinine, Ser 9.14 (H) 0.76 - 1.27 mg/dL   eGFR 41 (L) >78 GN/FAO/1.30   BUN/Creatinine Ratio 8 (L) 10 - 24   Sodium 140 134 - 144 mmol/L   Potassium 3.8 3.5 - 5.2 mmol/L   Chloride 104 96 - 106 mmol/L   CO2 19 (L) 20 - 29 mmol/L   Calcium 9.3 8.6 - 10.2 mg/dL   Total Protein 7.8 6.0 - 8.5 g/dL   Albumin 4.4 3.9 - 4.9 g/dL   Globulin, Total 3.4 1.5 - 4.5 g/dL   Bilirubin Total 0.5 0.0 - 1.2 mg/dL   Alkaline Phosphatase 72 44 - 121 IU/L   AST 24 0 - 40 IU/L   ALT 16 0 - 44 IU/L  Lipid panel     Status: Abnormal   Collection Time: 05/08/23  8:56 AM  Result Value Ref Range   Cholesterol, Total 133 100 - 199 mg/dL   Triglycerides 865 (H) 0 - 149 mg/dL   HDL 31 (L) >78 mg/dL   VLDL Cholesterol Cal 28 5 - 40 mg/dL   LDL Chol Calc (NIH) 74 0 - 99 mg/dL   Chol/HDL Ratio 4.3 0.0 - 5.0 ratio    Comment:  T. Chol/HDL Ratio                                             Men  Women                               1/2 Avg.Risk  3.4    3.3                                   Avg.Risk  5.0    4.4                                2X Avg.Risk  9.6    7.1                                 3X Avg.Risk 23.4   11.0   Surgical pathology     Status: None   Collection Time: 06/27/23 12:00 AM  Result Value Ref Range   SURGICAL PATHOLOGY      Surgical Pathology CASE: [id number] PATIENT: Refael Rappaport Flow Pathology Report     Clinical history: Lymphocytosis     DIAGNOSIS:  Peripheral blood, flow cytometry: -  No immunophenotypic evidence of a lymphoproliferative disorder (i.e. no monoclonal B cells or immunophenotypically abnormal T cells detected).  GATING AND PHENOTYPIC ANALYSIS:  Gated population: Flow cytometric immunophenotyping is performed using antibodies to the antigens listed in the table below. Electronic gates are placed around a cell cluster displaying light scatter properties corresponding to: Lymphocytes  Abnormal Cells in gated population: N/A  Phenotype of Abnormal Cells: N/A                      Lymphoid Antigens       Myeloid Antigens Miscellaneous CD2  tested    CD10 tested    CD11b     ND   CD45 tested CD3  tested    CD19 tested    CD11c     ND   HLA-Dr    ND CD4  tested    CD20 tested    CD13 ND   CD34 tested CD5  tested    CD22 ND   CD14 ND   CD38 tested CD7  te sted    CD79b     ND   CD15 ND   CD138     ND CD8  tested    CD103     ND   CD16 ND   TdT  ND CD25 ND   CD200     tested    CD33 ND   CD123     ND TCRab     ND   sKappa    tested    CD64 ND   CD41 ND TCRgd     tested    sLambda   tested    CD117     ND   CD61 ND CD56 tested    cKappa    ND   MPO  ND   CD71 ND CD57 ND   cLambda   ND  CD235a    ND      GROSS DESCRIPTION:  One Lavender top tub submitted from [facility] for Lymphoma testing.    Final Diagnosis performed by Orene Desanctis DO.   Electronically signed 06/29/2023 Technical and / or Professional components performed at [facility], [address] [place], [place] [address].  The above tests were developed and their performance  characteristics determined by the [facility] system for the physical and immunophenotypic characterization of cell populations. They have not been cleared by the U.S. Food and Drug administration. The  FDA has determined that such clearance or appro val is not necessary. This test is used for clinical purposes. It should not be  regarded as investigational or for research   Flow Cytometry, Peripheral Blood (Oncology)     Status: None   Collection Time: 06/27/23  2:56 PM  Result Value Ref Range   Flow Cytometry SEE SEPARATE REPORT     Comment: Performed at [facility], [address] [place], [place] [address]  C-reactive protein     Status: None   Collection Time: 06/27/23  2:56 PM  Result Value Ref Range   CRP 0.6 <1.0 mg/dL    Comment: Performed at [facility], [address], [place], [place] [address]  Sedimentation rate     Status: None   Collection Time: 06/27/23  2:56 PM  Result Value Ref Range   Sed Rate 11 0 - 16 mm/hr    Comment: Performed at [facility], [address] [place], [place] [address]  Lactate dehydrogenase     Status: None   Collection Time: 06/27/23  2:56 PM  Result Value Ref Range   LDH 162 98 - 192 U/L    Comment: Performed at [facility], [address] [place], [place] [address]  CMP (Cancer Center only)     Status: Abnormal   Collection Time: 06/27/23  2:56 PM  Result Value Ref Range   Sodium 137 135 - 145 mmol/L   Potassium 4.1 3.5 - 5.1 mmol/L   Chloride 104 98 - 111 mmol/L   CO2 27 22 - 32 mmol/L   Glucose, Bld 77 70 - 99 mg/dL    Comment: Glucose reference range applies only to samples taken after fasting for at least 8 hours.   BUN 9 8 - 23 mg/dL   Creatinine 3.55 (H) 7.32 - 1.24 mg/dL   Calcium 9.0 8.9 - 20.2 mg/dL   Total Protein 7.4 6.5 - 8.1 g/dL   Albumin 4.0 3.5 - 5.0 g/dL   AST 18 15 - 41 U/L   ALT 13 0 - 44 U/L   Alkaline Phosphatase 46 38 - 126 U/L   Total  Bilirubin 1.1 0.0 - 1.2 mg/dL   GFR, Estimated 49 (L) >60 mL/min    Comment: (NOTE) Calculated using the CKD-EPI Creatinine Equation (2021)    Anion gap 6 5 - 15    Comment: Performed at [facility], [address], [place], [place] [address]  CBC with Differential (Cancer Center Only)     Status: None   Collection Time: 06/27/23  2:56 PM  Result Value Ref Range   WBC Count 8.4 4.0 - 10.5 K/uL   RBC 5.26 4.22 - 5.81 MIL/uL   Hemoglobin 15.0 13.0 - 17.0 g/dL   HCT 62.3 76.2 - 83.1 %   MCV 85.7 80.0 - 100.0 fL   MCH 28.5 26.0 - 34.0 pg   MCHC  33.3 30.0 - 36.0 g/dL   RDW 62.9 52.8 - 41.3 %   Platelet Count 232 150 - 400 K/uL   nRBC 0.0 0.0 - 0.2 %   Neutrophils Relative % 44 %   Neutro Abs 3.8 1.7 - 7.7 K/uL   Lymphocytes Relative 44 %   Lymphs Abs 3.7 0.7 - 4.0 K/uL   Monocytes Relative 10 %   Monocytes Absolute 0.9 0.1 - 1.0 K/uL   Eosinophils Relative 1 %   Eosinophils Absolute 0.0 0.0 - 0.5 K/uL   Basophils Relative 1 %   Basophils Absolute 0.0 0.0 - 0.1 K/uL   Immature Granulocytes 0 %   Abs Immature Granulocytes 0.02 0.00 - 0.07 K/uL    Comment: Performed at [facility], [address], [place], [place] [address]     RADIOGRAPHIC STUDIES:  No recent pertinent imaging studies available to review.  Orders Placed This Encounter  Procedures   CBC with Differential (Cancer Center Only)    Standing Status:   Future    Expected Date:   09/25/2023    Expiration Date:   07/10/2024   CMP (Cancer Center only)    Standing Status:   Future    Expected Date:   09/25/2023    Expiration Date:   07/10/2024     Future Appointments  Date Time Provider Department Center  07/11/2023 11:00 AM [name], MD [facility] None  08/07/2023 11:20 AM Angelina Sheriff, DO [facility] CPR  09/08/2023  8:10 AM Hilbert Bible, FNP [facility]  09/08/2023  8:40 AM [facility] PRIMARY CARE CLINICAL SUPPORT [facility]  09/25/2023 10:45 AM [facility]  PHLEBOTOMIST [facility] None  09/25/2023 11:20 AM Larraine Argo, Archie Patten, MD [facility] None    This document was completed utilizing speech recognition software. Grammatical errors, random word insertions, pronoun errors, and incomplete sentences are an occasional consequence of this system due to software limitations, ambient noise, and hardware issues. Any formal questions or concerns about the content, text or information contained within the body of this dictation should be directly addressed to the provider for clarification.

## 2023-07-11 NOTE — Assessment & Plan Note (Addendum)
 Elevated lymphocyte count in December (4900 cells/L) and July (4000 cells/L). Normal total white count, red count, and platelet count.   On his initial consultation with [place] on 06/27/2023, labs showed white count of 8400 with normal differential, including normal lymphocyte count of 3700.  ANC normal at 3800.  Hemoglobin 15, platelet count normal at 232,000.  Creatinine 1.52, stable for him.  Otherwise unremarkable CMP.  LDH, ESR, CRP were all normal.  Flow cytometry of peripheral blood was unremarkable.   No significant weight loss, fever, chills, or lymphadenopathy. Night sweats reported.  Clinical picture not concerning for CLL or other lymphoproliferative process.     Provided reassurance and education about the nature of lymphocytosis.  - Schedule in-person follow-up in three months for repeat labs and evaluation.  If stable blood counts, he can be discharged from our office after next visit.

## 2023-07-28 ENCOUNTER — Other Ambulatory Visit: Payer: Self-pay | Admitting: Family Medicine

## 2023-08-07 ENCOUNTER — Encounter: Payer: Medicare Other | Admitting: Physical Medicine and Rehabilitation

## 2023-08-18 ENCOUNTER — Ambulatory Visit (HOSPITAL_COMMUNITY): Payer: Self-pay

## 2023-08-30 IMAGING — CT CT MAXILLOFACIAL W/O CM
2 series · 13 of 37 positions shown, 16 images · non-contrast
Comparison: Brain MRI 04/16/2021.  Maxillofacial CT 01/01/2019.

CLINICAL DATA: Provided history: Deviated nasal septum. Hypertrophy
of nasal turbinates. Additional history provided by scanning
technologist: Patient reports congestion and drainage for 1 year.
Prior sinus surgery.

EXAM:
CT MAXILLOFACIAL WITHOUT CONTRAST
TECHNIQUE: Multidetector CT images of the paranasal sinuses were obtained using
the standard protocol without intravenous contrast.
RADIATION DOSE REDUCTION: This exam was performed according to the
departmental dose-optimization program which includes automated
exposure control, adjustment of the mA and/or kV according to
patient size and/or use of iterative reconstruction technique.

[Series 3: sinus soft · axial · 0.50mm/px · z∈[-168,-50]mm · 10 of 69 slices shown, 13 images (1 of 2)]
[im 5/69  brain]
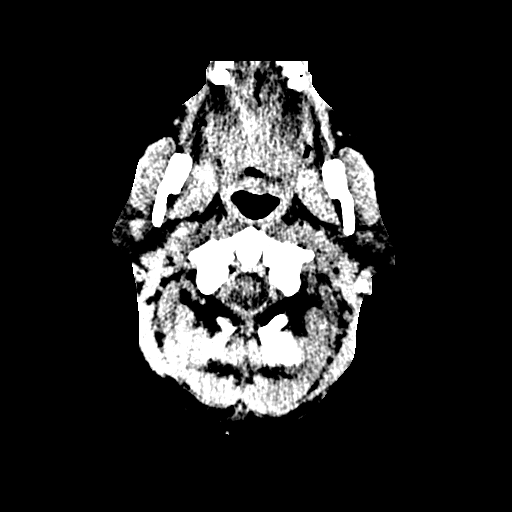
[im 5/69  bone]
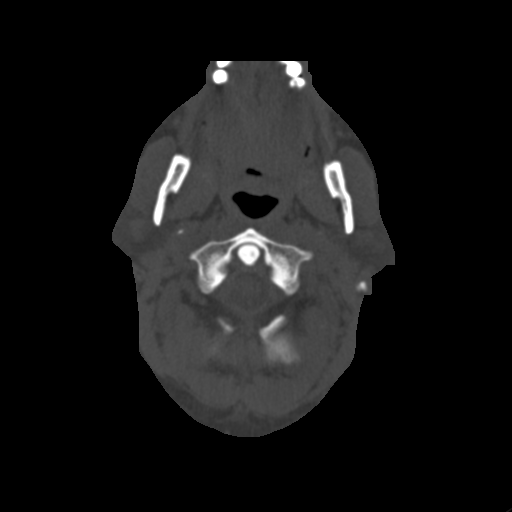
[im 12/69  bone]
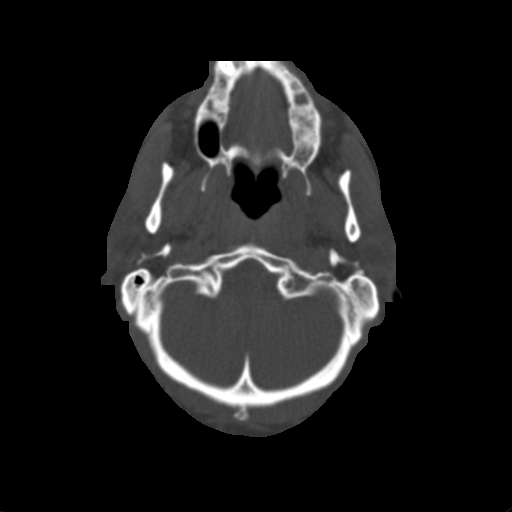
[im 19/69  bone]
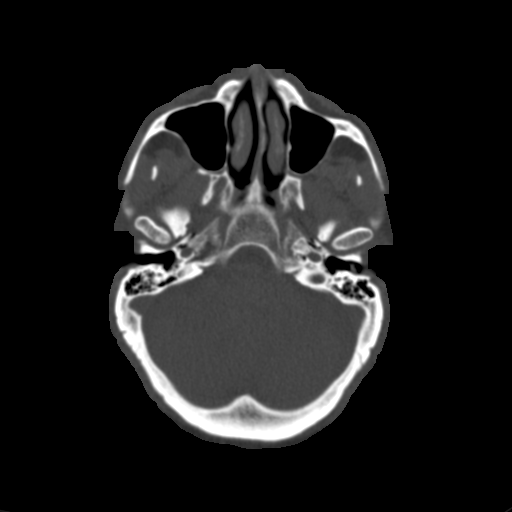
[im 24/69  bone]
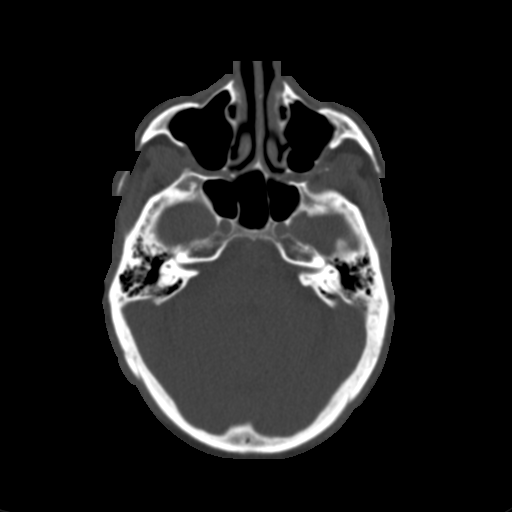
[im 31/69  brain]
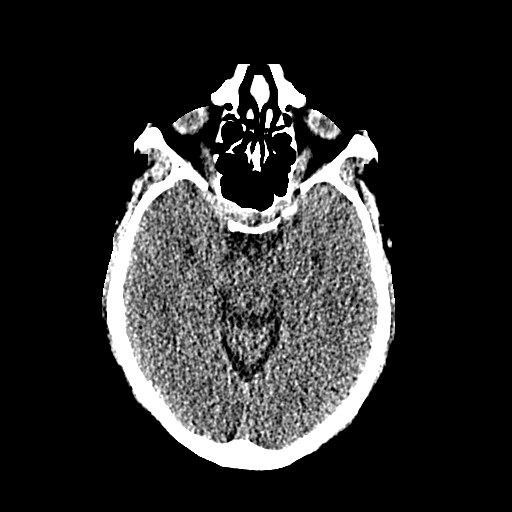
[im 31/69  bone]
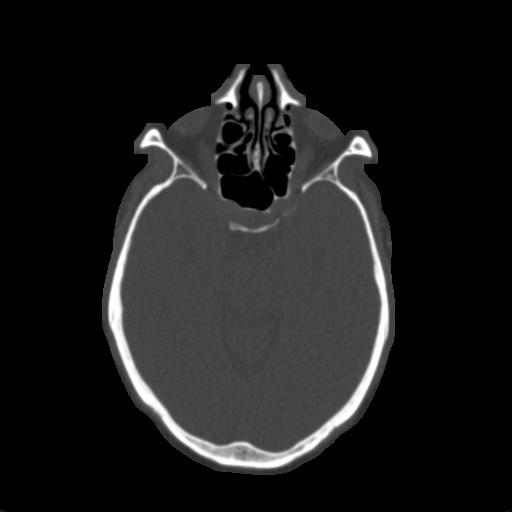
[im 38/69  bone]
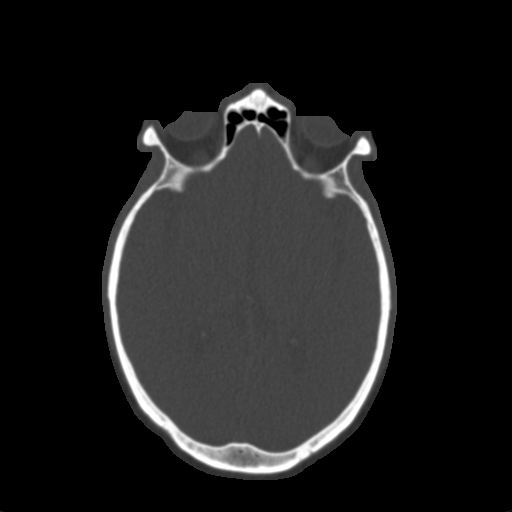
[im 45/69  bone]
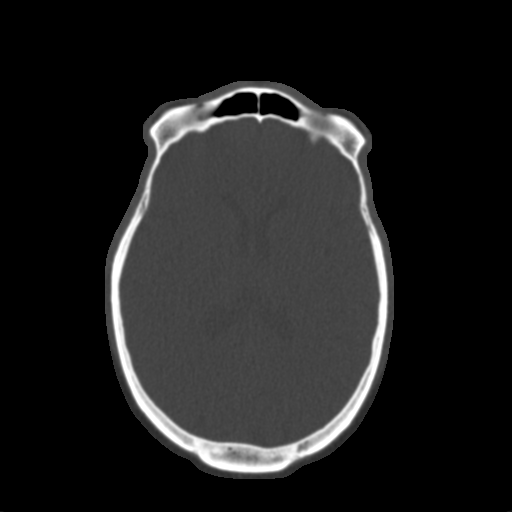
[im 52/69  bone]
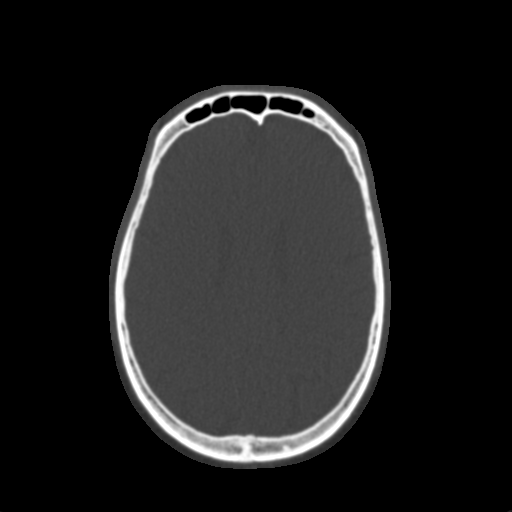
[im 57/69  brain]
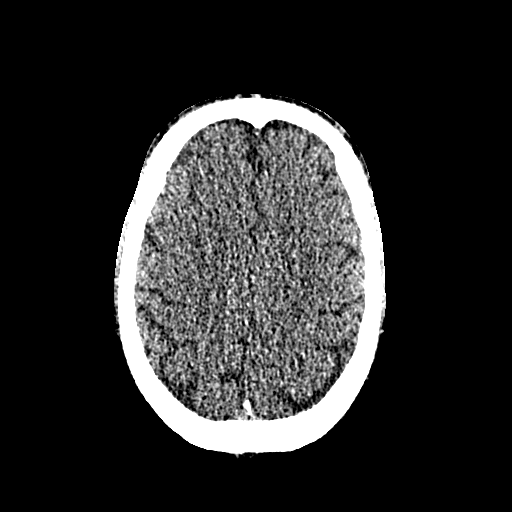
[im 57/69  bone]
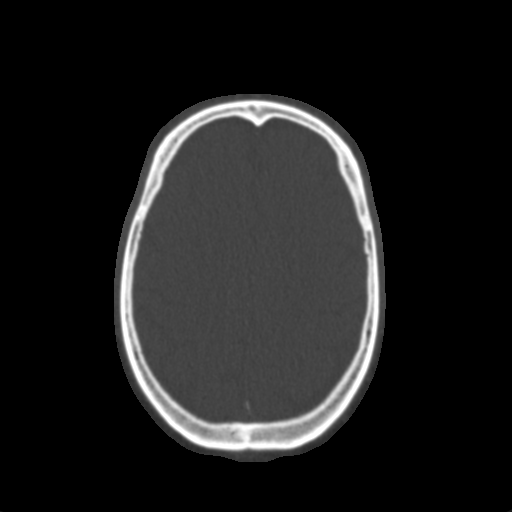
[im 64/69  bone]
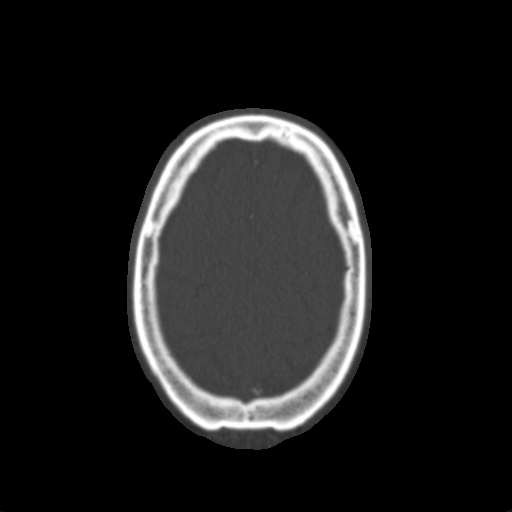

[Series 7: sinus soft · sagittal · 0.27mm/px · 3 of 102 slices shown (2 of 2)]
[im 34/102  bone]
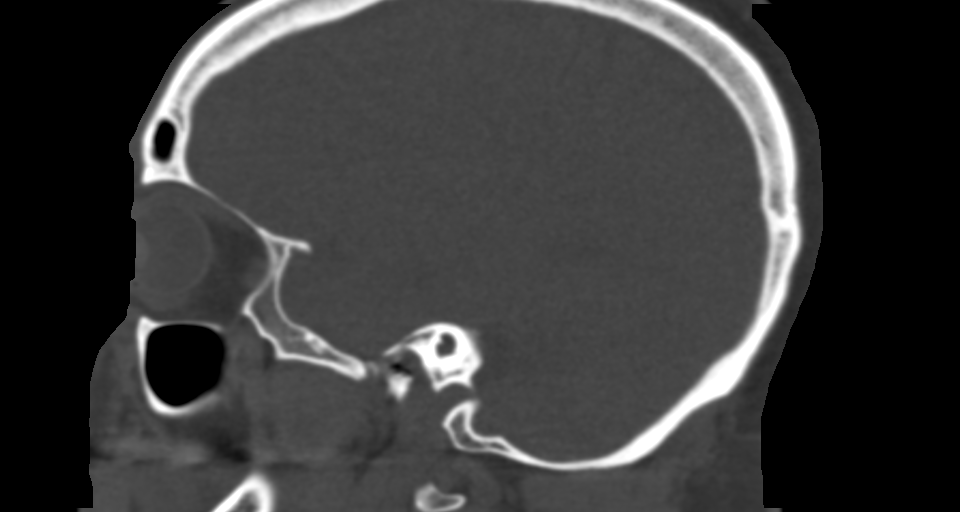
[im 51/102  bone]
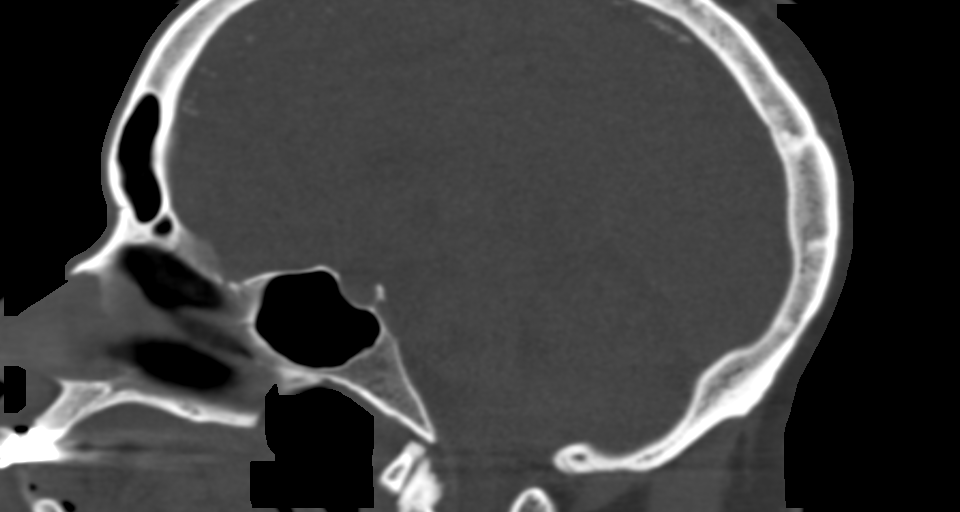
[im 68/102  bone]
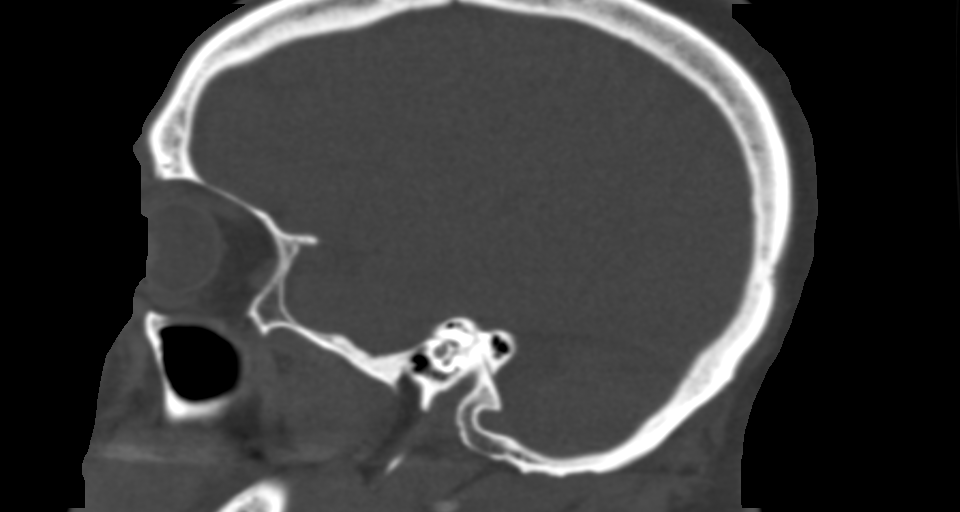

[13 of 37 positions shown; findings below may reference images not displayed]

FINDINGS: Paranasal sinuses:

Frontal: Trace mucosal thickening within the right frontoethmoidal
recess. Mild partial opacification of the right frontal sinus
drainage pathway. Normally aerated left frontal sinus. Patent left
frontal sinus drainage pathway.

Ethmoid: Normally aerated.

Maxillary: Trace mucosal thickening along the floors of the
maxillary sinuses.

Sphenoid: Normally aerated. Patent sphenoethmoidal recesses.

Right ostiomeatal unit: Patent.

Left ostiomeatal unit: Patent.

Nasal passages: Patent. Mild leftward deviation and leftward
spurring of the nasal septum.

Anatomy: Pneumatization is present superior to the anterior ethmoid
notch on the right. Symmetric and intact olfactory grooves and fovea
ethmoidalis, Keros II (4-7mm). Sellar sphenoid pneumatization
pattern.
IMPRESSION: Trace mucosal thickening within the right frontoethmoidal recess and
bilateral maxillary sinuses.

Mild partial opacification of the right frontal sinus drainage
pathway.

The paranasal sinuses are otherwise normally aerated, and the sinus
drainage pathways are otherwise patent.

Mild leftward deviation and leftward spurring of the nasal septum.

## 2023-09-08 ENCOUNTER — Other Ambulatory Visit (HOSPITAL_BASED_OUTPATIENT_CLINIC_OR_DEPARTMENT_OTHER): Payer: Medicare Other

## 2023-09-08 ENCOUNTER — Other Ambulatory Visit (HOSPITAL_BASED_OUTPATIENT_CLINIC_OR_DEPARTMENT_OTHER): Payer: Self-pay | Admitting: Family Medicine

## 2023-09-08 ENCOUNTER — Other Ambulatory Visit: Payer: Self-pay | Admitting: Family Medicine

## 2023-09-08 ENCOUNTER — Ambulatory Visit (HOSPITAL_BASED_OUTPATIENT_CLINIC_OR_DEPARTMENT_OTHER): Payer: Medicare Other | Admitting: Family Medicine

## 2023-09-08 DIAGNOSIS — R7303 Prediabetes: Secondary | ICD-10-CM

## 2023-09-08 LAB — HEMOGLOBIN A1C
Est. average glucose Bld gHb Est-mCnc: 123 mg/dL
Hgb A1c MFr Bld: 5.9 % — ABNORMAL HIGH (ref 4.8–5.6)

## 2023-09-11 ENCOUNTER — Encounter (HOSPITAL_BASED_OUTPATIENT_CLINIC_OR_DEPARTMENT_OTHER): Payer: Self-pay | Admitting: Family Medicine

## 2023-09-11 NOTE — Progress Notes (Signed)
 Hi Nicholas Cooke,  Your A1C has improved from 4 months ago form 6.1 down to 5.9. Please continue your Metformin  and dietary changes for control of your blood sugar. We will plan to recheck in 3-6 months .

## 2023-09-13 ENCOUNTER — Other Ambulatory Visit (HOSPITAL_BASED_OUTPATIENT_CLINIC_OR_DEPARTMENT_OTHER): Payer: Self-pay | Admitting: *Deleted

## 2023-09-13 MED ORDER — ROSUVASTATIN CALCIUM 10 MG PO TABS: 10.0000 mg | ORAL_TABLET | Freq: Every day | ORAL | 3 refills | Status: DC

## 2023-09-25 ENCOUNTER — Inpatient Hospital Stay: Payer: Medicare Other | Attending: Oncology

## 2023-09-25 ENCOUNTER — Encounter: Payer: Self-pay | Admitting: Oncology

## 2023-09-25 ENCOUNTER — Inpatient Hospital Stay: Payer: Medicare Other | Admitting: Oncology

## 2023-09-25 VITALS — BP 134/81 | HR 77 | Temp 98.3°F | Resp 17 | Wt 186.4 lb

## 2023-09-25 DIAGNOSIS — R61 Generalized hyperhidrosis: Secondary | ICD-10-CM | POA: Insufficient documentation

## 2023-09-25 DIAGNOSIS — K5790 Diverticulosis of intestine, part unspecified, without perforation or abscess without bleeding: Secondary | ICD-10-CM | POA: Diagnosis not present

## 2023-09-25 DIAGNOSIS — D72820 Lymphocytosis (symptomatic): Secondary | ICD-10-CM | POA: Diagnosis present

## 2023-09-25 LAB — CBC WITH DIFFERENTIAL (CANCER CENTER ONLY)
Abs Immature Granulocytes: 0.03 10*3/uL (ref 0.00–0.07)
Basophils Absolute: 0 10*3/uL (ref 0.0–0.1)
Basophils Relative: 0 %
Eosinophils Absolute: 0 10*3/uL (ref 0.0–0.5)
Eosinophils Relative: 0 %
HCT: 49.2 % (ref 39.0–52.0)
Hemoglobin: 16.4 g/dL (ref 13.0–17.0)
Immature Granulocytes: 0 %
Lymphocytes Relative: 32 %
Lymphs Abs: 4 10*3/uL (ref 0.7–4.0)
MCH: 28.3 pg (ref 26.0–34.0)
MCHC: 33.3 g/dL (ref 30.0–36.0)
MCV: 85 fL (ref 80.0–100.0)
Monocytes Absolute: 1 10*3/uL (ref 0.1–1.0)
Monocytes Relative: 8 %
Neutro Abs: 7.4 10*3/uL (ref 1.7–7.7)
Neutrophils Relative %: 60 %
Platelet Count: 203 10*3/uL (ref 150–400)
RBC: 5.79 MIL/uL (ref 4.22–5.81)
RDW: 13.6 % (ref 11.5–15.5)
WBC Count: 12.6 10*3/uL — ABNORMAL HIGH (ref 4.0–10.5)
nRBC: 0 % (ref 0.0–0.2)

## 2023-09-25 LAB — CMP (CANCER CENTER ONLY)
ALT: 12 U/L (ref 0–44)
AST: 20 U/L (ref 15–41)
Albumin: 4.4 g/dL (ref 3.5–5.0)
Alkaline Phosphatase: 72 U/L (ref 38–126)
Anion gap: 11 (ref 5–15)
BUN: 11 mg/dL (ref 8–23)
CO2: 25 mmol/L (ref 22–32)
Calcium: 9.7 mg/dL (ref 8.9–10.3)
Chloride: 102 mmol/L (ref 98–111)
Creatinine: 1.46 mg/dL — ABNORMAL HIGH (ref 0.61–1.24)
GFR, Estimated: 51 mL/min — ABNORMAL LOW (ref >60)
Glucose, Bld: 99 mg/dL (ref 70–99)
Potassium: 3.9 mmol/L (ref 3.5–5.1)
Sodium: 138 mmol/L (ref 135–145)
Total Bilirubin: 1 mg/dL (ref 0.0–1.2)
Total Protein: 7.4 g/dL (ref 6.5–8.1)

## 2023-09-25 NOTE — Assessment & Plan Note (Signed)
 Recurrent diverticulosis/diverticulitis with acute flare-up since Friday. Pain localized to the usual site of previous episodes. Night sweats present. No fever, chills, unusual bowel movements, or blood in stools. Previous colonoscopy in 2020 showed no surgical indication unless total gastrectomy is considered. Current management involves antibiotics during flare-ups to prevent progression. - Awaiting call from gastroenterologist Dr. Feliberto Hopping for further management plan. - Advise to go to the emergency room if pain worsens, fever develops, or if there are unusual bowel movements such as blood in stools or constipation.

## 2023-09-25 NOTE — Progress Notes (Signed)
 [name]  HEMATOLOGY CLINIC PROGRESS NOTE  PATIENT NAMERomone Cooke   MR#: [id number] DOB: 05-04-1951  Patient Care Team: Nonda Bays, FNP as PCP - General (Family Medicine) Jhonny Moss, MD as Consulting Physician (Neurology) [facility], [facility]  Date of visit: 09/25/2023   ASSESSMENT & PLAN:   Nicholas Cooke is a 73 y.o. 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    Lymphocytosis Elevated lymphocyte count in December (4900 cells/L) and July (4000 cells/L). Normal total white count, red count, and platelet count.   On his initial consultation with us  on 06/27/2023, labs showed white count of 8400 with normal differential, including normal lymphocyte count of 3700.  ANC normal at 3800.  Hemoglobin 15, platelet count normal at 232,000.  Creatinine 1.52, stable for him.  Otherwise unremarkable CMP.  LDH, ESR, CRP were all normal.  Flow cytometry of peripheral blood was unremarkable.  Labs today showed white count of 12,600 with ALC of 4000, normal lymphocyte count.  Hemoglobin, platelet count are within normal limits.   No significant weight loss, fever, chills, or lymphadenopathy. Night sweats reported.  Clinical picture not concerning for CLL or other lymphoproliferative process.     Provided reassurance and education about the nature of lymphocytosis.  Since no additional hematological workup or intervention is needed, he can be discharged from our office for continued follow-up with his PCP and other specialists.  Please reconsult us  as necessary.  Thank you for the opportunity to participate in his care.  Diverticulosis Recurrent diverticulosis/diverticulitis with acute flare-up since Friday. Pain localized to the usual site of previous episodes. Night sweats present. No fever, chills, unusual bowel  movements, or blood in stools. Previous colonoscopy in 2020 showed no surgical indication unless total gastrectomy is considered. Current management involves antibiotics during flare-ups to prevent progression. - Awaiting call from gastroenterologist Dr. Feliberto Hopping for further management plan. - Advise to go to the emergency room if pain worsens, fever develops, or if there are unusual bowel movements such as blood in stools or constipation.   I spent a total of 20 minutes during this encounter with the patient including review of chart and various tests results, discussions about plan of care and coordination of care plan.  I reviewed lab results and outside records for this visit and discussed relevant results with the patient. Diagnosis, plan of care and treatment options were also discussed in detail with the patient. Opportunity provided to ask questions and answers provided to his apparent satisfaction. Provided instructions to call our clinic with any problems, questions or concerns prior to return visit. I recommended to continue follow-up with PCP and sub-specialists. He verbalized understanding and agreed with the plan. No barriers to learning was detected.  Arlo Berber, MD  09/25/2023 1:18 PM  [facility] - A DEPT OF [facility] [id number] [place] [address] Dept: [phone] Dept Fax: [phone]   CHIEF COMPLAINT/ REASON FOR VISIT:  Follow-up for history of lymphocytosis.  Grossly negative hematological workup.  INTERVAL HISTORY:  Discussed the use of AI scribe software for clinical note transcription with the patient, who gave verbal consent to proceed.  History of Present Illness Nicholas Cooke is a 73 year old male with hx of diverticulosis who presents for follow-up for history of lymphocytosis.   He reports abdominal pain that  began on Friday and intensified overnight, causing a lack of sleep.  The pain is constant and located in the same area as previous diverticulitis flare-ups. He experiences these episodes infrequently, with intervals ranging from six months to two years. His last flare-up was in August 2021, following an initial episode in 2011 and another in 2021. He is awaiting a call from his gastroenterologist, Dr. Feliberto Hopping, for further management.  No blood in stools or melena, but he reports experiencing night sweats. Bowel movements are occurring normally without any unusual changes.  He has a history of undergoing a colonoscopy in 2020, which revealed no actionable findings. Recent blood work shows a normal lymphocyte count of 4,000, although his white blood cell count is slightly elevated at 12,600. Previous workups, including a bone marrow test, showed no signs of leukemia or other hematological concerns.   SUMMARY OF HEMATOLOGIC HISTORY:  Labs at his PCPs office on 05/08/2023 showed white count of 9400 with absolute lymphocyte count increased at 4900, ANC of 3700, otherwise normal differential.  Hemoglobin was normal at 16.4, platelet count normal at 260,000.  Previously labs in July 2024 also showed mildly elevated lymphocyte count of 3900 when the white count was normal at 8400.  Given persistent lymphocytosis, referral was sent to us  for further evaluation.   The patient was not previously aware of this abnormality. The lymphocyte count was 4900, slightly above the normal upper limit of 4000. The patient's total white count, red count, and platelet count were all within normal limits. The patient's lymphocyte count has been slightly elevated since July of the previous year.   In the past couple of months, the patient has noticed a decrease in appetite and has been eating less frequently. The patient also reported experiencing night sweats, with episodes of waking up with a wet back and pajamas. The patient denied any swelling in the neck or underarm area. The patient also  reported an episode of falling due to massive disorientation and inability to focus, which lasted for about half an hour. The patient did not lose consciousness during this episode. The patient also reported having diarrhea five to six times in one day prior to the episode of falling. The patient denied any history of seizures or strokes.   He denied fever, cough, diarrhea, or other infectious symptoms.  He denied epistaxis, bloody stool, melena, hematuria, bruising or other bleeding symptoms. He also denies unintentional weight loss, night sweats or other constitutional symptoms.   On his initial consultation with us  on 06/27/2023, labs showed white count of 8400 with normal differential, including normal lymphocyte count of 3700.  ANC normal at 3800.  Hemoglobin 15, platelet count normal at 232,000.  Creatinine 1.52, stable for him.  Otherwise unremarkable CMP.  LDH, ESR, CRP were all normal.  Flow cytometry of peripheral blood was unremarkable.   No significant weight loss, fever, chills, or lymphadenopathy. Night sweats reported.  Clinical picture not concerning for CLL or other lymphoproliferative process.     Provided reassurance and education about the nature of lymphocytosis.  I have reviewed the past medical history, past surgical history, social history and family history with the patient and they are unchanged from previous note.  ALLERGIES: He has no known allergies.  MEDICATIONS:  Current Outpatient Medications  Medication Sig Dispense Refill   aspirin  EC 81 MG tablet Take 1 tablet (81 mg total) by mouth daily. Swallow whole. 90 tablet 1   azelastine  (ASTELIN ) 0.1 % nasal spray Place 2 sprays into  both nostrils 2 (two) times daily as needed for rhinitis. Use in each nostril as directed 30 mL 12   escitalopram  (LEXAPRO ) 20 MG tablet Take 1 tablet (20 mg total) by mouth daily. 90 tablet 3   gabapentin  (NEURONTIN ) 300 MG capsule Take 300 mg by mouth 2 (two) times daily.     lisinopril   (ZESTRIL ) 40 MG tablet Take 1 tablet (40 mg total) by mouth daily. 90 tablet 3   metFORMIN  (GLUCOPHAGE -XR) 500 MG 24 hr tablet Take 1 tablet (500 mg total) by mouth daily with breakfast. 90 tablet 3   rosuvastatin  (CRESTOR ) 10 MG tablet Take 1 tablet (10 mg total) by mouth daily. 90 tablet 3   tadalafil (CIALIS) 5 MG tablet Take 5 mg by mouth daily as needed for erectile dysfunction.     traZODone  (DESYREL ) 100 MG tablet Take 1 tablet (100 mg total) by mouth at bedtime. 90 tablet 3   No current facility-administered medications for this visit.     REVIEW OF SYSTEMS:    Review of Systems  Gastrointestinal:  Positive for abdominal pain.  Neurological:  Positive for numbness.    All other pertinent systems were reviewed with the patient and are negative.  PHYSICAL EXAMINATION:   Onc Performance Status - 09/25/23 1114       ECOG Perf Status   ECOG Perf Status Fully active, able to carry on all pre-disease performance without restriction      KPS SCALE   KPS % SCORE Normal, no compliants, no evidence of disease             Vitals:   09/25/23 1053  BP: 134/81  Pulse: 77  Resp: 17  Temp: 98.3 F (36.8 C)  SpO2: 98%   Filed Weights   09/25/23 1053  Weight: 186 lb 6.4 oz (84.6 kg)    Physical Exam Constitutional:      General: He is not in acute distress.    Appearance: Normal appearance.  HENT:     Head: Normocephalic and atraumatic.  Eyes:     General: No scleral icterus.    Conjunctiva/sclera: Conjunctivae normal.  Cardiovascular:     Rate and Rhythm: Normal rate and regular rhythm.  Pulmonary:     Effort: Pulmonary effort is normal.  Abdominal:     General: There is no distension.  Neurological:     General: No focal deficit present.     Mental Status: He is alert and oriented to person, place, and time.  Psychiatric:        Mood and Affect: Mood normal.        Behavior: Behavior normal.        Thought Content: Thought content normal.      LABORATORY DATA:   I have reviewed the data as listed.  Results for orders placed or performed in visit on 09/25/23  CMP (Cancer Center only)  Result Value Ref Range   Sodium 138 135 - 145 mmol/L   Potassium 3.9 3.5 - 5.1 mmol/L   Chloride 102 98 - 111 mmol/L   CO2 25 22 - 32 mmol/L   Glucose, Bld 99 70 - 99 mg/dL   BUN 11 8 - 23 mg/dL   Creatinine 1.61 (H) 0.96 - 1.24 mg/dL   Calcium  9.7 8.9 - 10.3 mg/dL   Total Protein 7.4 6.5 - 8.1 g/dL   Albumin 4.4 3.5 - 5.0 g/dL   AST 20 15 - 41 U/L   ALT 12 0 - 44 U/L  Alkaline Phosphatase 72 38 - 126 U/L   Total Bilirubin 1.0 0.0 - 1.2 mg/dL   GFR, Estimated 51 (L) >60 mL/min   Anion gap 11 5 - 15  CBC with Differential (Cancer Center Only)  Result Value Ref Range   WBC Count 12.6 (H) 4.0 - 10.5 K/uL   RBC 5.79 4.22 - 5.81 MIL/uL   Hemoglobin 16.4 13.0 - 17.0 g/dL   HCT 95.6 21.3 - 08.6 %   MCV 85.0 80.0 - 100.0 fL   MCH 28.3 26.0 - 34.0 pg   MCHC 33.3 30.0 - 36.0 g/dL   RDW 57.8 46.9 - 62.9 %   Platelet Count 203 150 - 400 K/uL   nRBC 0.0 0.0 - 0.2 %   Neutrophils Relative % 60 %   Neutro Abs 7.4 1.7 - 7.7 K/uL   Lymphocytes Relative 32 %   Lymphs Abs 4.0 0.7 - 4.0 K/uL   Monocytes Relative 8 %   Monocytes Absolute 1.0 0.1 - 1.0 K/uL   Eosinophils Relative 0 %   Eosinophils Absolute 0.0 0.0 - 0.5 K/uL   Basophils Relative 0 %   Basophils Absolute 0.0 0.0 - 0.1 K/uL   Immature Granulocytes 0 %   Abs Immature Granulocytes 0.03 0.00 - 0.07 K/uL    RADIOGRAPHIC STUDIES:  No recent pertinent imaging studies available to review.  No orders of the defined types were placed in this encounter.    No future appointments.    This document was completed utilizing speech recognition software. Grammatical errors, random word insertions, pronoun errors, and incomplete sentences are an occasional consequence of this system due to software limitations, ambient noise, and hardware issues. Any formal questions or concerns  about the content, text or information contained within the body of this dictation should be directly addressed to the provider for clarification.

## 2023-09-25 NOTE — Assessment & Plan Note (Addendum)
 Elevated lymphocyte count in December (4900 cells/L) and July (4000 cells/L). Normal total white count, red count, and platelet count.   On his initial consultation with us  on 06/27/2023, labs showed white count of 8400 with normal differential, including normal lymphocyte count of 3700.  ANC normal at 3800.  Hemoglobin 15, platelet count normal at 232,000.  Creatinine 1.52, stable for him.  Otherwise unremarkable CMP.  LDH, ESR, CRP were all normal.  Flow cytometry of peripheral blood was unremarkable.  Labs today showed white count of 12,600 with ALC of 4000, normal lymphocyte count.  Hemoglobin, platelet count are within normal limits.   No significant weight loss, fever, chills, or lymphadenopathy. Night sweats reported.  Clinical picture not concerning for CLL or other lymphoproliferative process.     Provided reassurance and education about the nature of lymphocytosis.  Since no additional hematological workup or intervention is needed, he can be discharged from our office for continued follow-up with his PCP and other specialists.  Please reconsult us  as necessary.  Thank you for the opportunity to participate in his care.

## 2023-10-14 ENCOUNTER — Other Ambulatory Visit: Payer: Self-pay | Admitting: Family Medicine

## 2023-10-31 ENCOUNTER — Other Ambulatory Visit: Payer: Self-pay | Admitting: Family Medicine

## 2023-12-05 ENCOUNTER — Ambulatory Visit (INDEPENDENT_AMBULATORY_CARE_PROVIDER_SITE_OTHER): Admitting: Otolaryngology

## 2023-12-05 ENCOUNTER — Encounter (INDEPENDENT_AMBULATORY_CARE_PROVIDER_SITE_OTHER): Payer: Self-pay | Admitting: Otolaryngology

## 2023-12-05 VITALS — BP 132/82 | HR 78

## 2023-12-05 DIAGNOSIS — R220 Localized swelling, mass and lump, head: Secondary | ICD-10-CM

## 2023-12-05 DIAGNOSIS — J3489 Other specified disorders of nose and nasal sinuses: Secondary | ICD-10-CM

## 2023-12-05 NOTE — Progress Notes (Signed)
 Dear Dr. Knute, Here is my assessment for our mutual patient, Ricci Casler. Thank you for allowing me the opportunity to care for your patient. Please do not hesitate to contact me should you have any other questions. Sincerely, Dr. Eldora Blanch  Otolaryngology Clinic Note Referring provider: Dr. Knute HPI:  Nicholas Cooke is a 73 y.o. male kindly referred by Dr. Knute for evaluation of nasal crusting and morning time nasal drainage.  Initial visit (06/22/2023): He reports that he has had problem with nasal crusting for years, and he reports it seems like it is flakey skin. He feels like there is something there. Does not itch. Worse when he lets it go for a couple of days, and builds up. It is mostly anterior (points to nasal valve area). He uses vicks (before he goes to sleep and in AM), and saline spray to clear it up. He also reports mucoid rhinorrhea bilateral rhinorrhea in the morning. He is on PO anthistamine and this dries his nose out enough and this helps with the rhinorrhea.   No frequent sinus infections - no facial pressure, pain, discolored drainage, intermittent congestion, and sense of smell is intact. No typical AR symptoms. No prior allergy testing.   Does not use ayr gel or vaseline. No rinse use. No decongestant use Does not use CPAP, nose does not feel dry  --------------------------------------------------------- 12/05/2023 He feels like there is a growth inside the nose on both sides, he feels like it growing (points to turbinates) Nose does not feel dry but it feels like something builds up. He used ayr gel and ponaris but did not help because he reports that the growths are still there. Not using neti pot, still using Vicks. No frequent sinus infections. He feels like there is a growth on the left side of the face that he feels like it is protruding out and growing (nasomaxillary buttress area). No infections or tenderness. He denies any pain but he feels like it is  making his eye heavier. No other symptoms in the nose. Some mucoid rhinorrhea in nose but not discolored. Denies CRS symptoms. He has noticed this for about 3 weeks  --------------------------------------------------------------------------- H&N Surgery: Prior Nasal Surgery on right (unclear what it was) - late 47s Personal or FHx of bleeding dz or anesthesia difficulty: no   GLP-1: no AP/AC: ASA 81  Tobacco: no. Occupation: drove trash and recycling truck. Lives in [place], [place]  PMHx: HTN, CKD, HLD, Prediabetes, Lymphocytosis  Independent Review of Additional Tests or Records:  [name] (05/19/2023): Noted sensation of obstruction and possible growth and nasal crusting. Nothing identified. Dx prior with GERD and PPI. No improvement on mupirocin. Wishes for 2nd opinion; Dx: Rhinitis; Rx: Ref ENT Dr. Llewellyn (09/19/2022) and (06/28/2021) [facility]: growth in nose, no mass; crusting/dryness; recent sinus CT and MRI; No significant nasal symptom; Dx: some step off from nasal bones, no infection; Rec irrigations, vaseline. In 2024 had nocturnal cough episodes; flonase without benefit; Dx: Globus and nasal crusting; reassured; f/u PRN CBC and CMP 05/08/2023: CR 1.78/BUN 14; LFTs generally wnl; CBC wnl except lymphocytosis; Eos 100 CT Face 06/02/2021 independently interpreted and reviewed with respect to nasal cavity and ears: agree with read, left septal deviation, minimal frontal opacification, no significant disease; mastoids, ME well aerated; ossicles unremarkable MRI Brain 03/24/2021 independent interpret: no significant sinus disease, no large retrocochlear lesions PMH/Meds/All/SocHx/FamHx/ROS:   Past Medical History:  Diagnosis Date   Chronic kidney disease    Dental crowns present  Depression    Enlarged prostate    Hypertension    states under control with med., has been on med. x 2 yr.   Inguinal hernia 12/2015   Osteoarthritis      Past Surgical History:  Procedure  Laterality Date   APPENDECTOMY  yrs ago   CYSTOSCOPY WITH INSERTION OF UROLIFT N/A 07/03/2018   Procedure: CYSTOSCOPY WITH INSERTION OF UROLIFT;  Surgeon: Nieves Cough, MD;  Location: [facility];  Service: Urology;  Laterality: N/A;  ONLY NEEDS 30 MIN FOR PROCEDURE   HERNIA REPAIR  2014   INGUINAL HERNIA REPAIR  02/16/2012   Procedure: HERNIA REPAIR INGUINAL ADULT;  Surgeon: Vicenta DELENA Poli, MD;  Location: [facility];  Service: General;  Laterality: Left;  left inguinal hernia repair with mesh   INGUINAL HERNIA REPAIR Bilateral 12/23/2015   Procedure: LAPAROSCOPIC LEFT INGUINAL HERNIA WITH MESH;  Surgeon: Vicenta Poli, MD;  Location: [facility];  Service: General;  Laterality: Bilateral;   INSERTION OF MESH Bilateral 12/23/2015   Procedure: INSERTION OF MESH;  Surgeon: Vicenta Poli, MD;  Location: [facility];  Service: General;  Laterality: Bilateral;   JOINT REPLACEMENT     KNEE ARTHROSCOPY WITH MEDIAL MENISECTOMY Right 10/18/2013   Procedure: RIGHT KNEE ARTHROSCOPY WITH PARTIAL MEDIAL MENISECTOMY;  Surgeon: Reyes JAYSON Billing, MD;  Location: [facility];  Service: Orthopedics;  Laterality: Right;   SHOULDER ARTHROSCOPY WITH ROTATOR CUFF REPAIR Left 2001   TOTAL KNEE ARTHROPLASTY Right 05/14/2015   Procedure: REMOVAL OF HARDWARE AND RIGHT TOTAL KNEE ARTHROPLASTY;  Surgeon: Reyes Billing, MD;  Location: [facility];  Service: Orthopedics;  Laterality: Right;   ULNAR NERVE REPAIR Right 1996    Family History  Problem Relation Age of Onset   Arthritis Mother    Stroke Father    Early death Sister      Social Connections: Moderately Isolated (11/23/2022)   Social Connection and Isolation Panel    Frequency of Communication with Friends and Family: More than three times a week    Frequency of Social Gatherings with Friends and Family: Three times a week    Attends Religious Services: Never    Active Member of Clubs or  Organizations: No    Attends Engineer, structural: Not on file    Marital Status: Married      Current Outpatient Medications:    aspirin  EC 81 MG tablet, Take 1 tablet (81 mg total) by mouth daily. Swallow whole., Disp: 90 tablet, Rfl: 1   azelastine  (ASTELIN ) 0.1 % nasal spray, Place 2 sprays into both nostrils 2 (two) times daily as needed for rhinitis. Use in each nostril as directed, Disp: 30 mL, Rfl: 12   escitalopram  (LEXAPRO ) 20 MG tablet, Take 1 tablet (20 mg total) by mouth daily., Disp: 90 tablet, Rfl: 3   gabapentin  (NEURONTIN ) 300 MG capsule, Take 300 mg by mouth 2 (two) times daily., Disp: , Rfl:    lisinopril  (ZESTRIL ) 40 MG tablet, Take 1 tablet (40 mg total) by mouth daily., Disp: 90 tablet, Rfl: 3   lisinopril -hydrochlorothiazide  (ZESTORETIC ) 20-25 MG tablet, Take 1 tablet by mouth daily., Disp: , Rfl:    metFORMIN  (GLUCOPHAGE -XR) 500 MG 24 hr tablet, Take 1 tablet (500 mg total) by mouth daily with breakfast., Disp: 90 tablet, Rfl: 3   rosuvastatin  (CRESTOR ) 10 MG tablet, Take 1 tablet (10 mg total) by mouth daily., Disp: 90 tablet, Rfl: 3   tadalafil (CIALIS) 5 MG tablet, Take 5  mg by mouth daily as needed for erectile dysfunction., Disp: , Rfl:    traZODone  (DESYREL ) 100 MG tablet, Take 1 tablet (100 mg total) by mouth at bedtime., Disp: 90 tablet, Rfl: 3   Physical Exam:   BP 132/82 (BP Location: Left Arm, Patient Position: Sitting, Cuff Size: Large)   Pulse 78   SpO2 96%   Salient findings:  CN II-XII intact  Bilateral EAC clear and TM intact with well pneumatized middle ear spaces Anterior rhinoscopy: Septum dev left; nasal cavity does look bilateral inferior turbinates with R>L hypertrophy. Nasal endoscopy was indicated to better evaluate the nose and paranasal sinuses, given the patient's history and exam findings, and is detailed below. No lesions of oral cavity/oropharynx; dentition fair The area on face he points to appears to be his nasomaxillary  suture line area, unable to appreciate any masses there. No obviously palpable neck masses/lymphadenopathy/thyromegaly No respiratory distress or stridor  Seprately Identifiable Procedures:  PROCEDURE: Bilateral Diagnostic Rigid Nasal Endoscopy Pre-procedure diagnosis: Concern for nasal lesion Post-procedure diagnosis: same Indication: See pre-procedure diagnosis and physical exam above Complications: None apparent EBL: 0 mL Anesthesia: Lidocaine  4% and topical decongestant was topically sprayed in each nasal cavity  Description of Procedure:  Patient was identified. A rigid 30 degree endoscope was utilized to evaluate the sinonasal cavities, mucosa, sinus ostia and turbinates and septum.  Overall, signs of mucosal inflammation are not noted.  Mild L > R anterior dryness. No nasal lesions noted.  No mucopurulence, polyps, or masses noted.   Right Middle meatus: clear Right SE Recess: clear Left MM: clear Left SE Recess: clear     Photodocumentation was obtained.  CPT CODE -- 68768 - Mod 25   Impression & Plans:  Maycol Schutt is a 73 y.o. male with:  1. Facial mass   2. Nasal lesion   3. Nasal dryness    Noted concern for facial mass per patient - appears to just be nasomaxillary buttress area. No pain. But he is quite concerned about so we discussed R/B/A and he opted for a CT to rule out any masses. Nasal lesion - advised these are his turbinates and would not recommend resection. They do crust so recommend continued nasal humidification and start daily rinses. Prior ay gel and ponaris did not help  Will call with results  See below regarding exact medications prescribed this encounter including dosages and route: No orders of the defined types were placed in this encounter.     Thank you for allowing me the opportunity to care for your patient. Please do not hesitate to contact me should you have any other questions.  Sincerely, Eldora Blanch, MD Otolaryngologist  (ENT), [facility] Phone: [phone] Fax: [phone]  12/05/2023, 1:17 PM   MDM:  Level 4 - 99213 Complexity/Problems addressed: mod - new problem, chronic problem Data complexity: low - Morbidity: low - Prescription Drug prescribed or managed: no

## 2023-12-05 NOTE — Patient Instructions (Signed)
 I have ordered an imaging study for you to complete prior to your next visit. Please call [facility] Scheduling at [phone] to schedule your imaging if you have not received a call within 24 hours. If you are unable to complete your imaging study prior to your next scheduled visit please call our office to let us  know.    Aureliano Med Nasal Saline Rinse - use daily - start nasal saline rinses with NeilMed Bottle available over the counter    Nasal Saline Irrigation instructions: If you choose to make your own salt water  solution, You will need: Salt (kosher, canning, or pickling salt) Baking soda Nasal irrigation bottle (i.e. Aureliano Med Sinus Rinse) Measuring spoon ( teaspoon) Distilled / boiled water    Mix solution Mix 1 teaspoon of salt, 1/2 teaspoon of baking soda and 1 cup of water  into irrigation bottle ** May use saline packet instead of homemade recipe for this step if you prefer If medicine was prescribed to be mixed with solution, place this into bottle Examples 2 inches of 2% mupirocin ointment Budesonide solution Position your head: Lean over sink (about 45 degrees) Rotate head (about 45 degrees) so that one nostril is above the other Irrigate Insert tip of irrigation bottle into upper nostril so it forms a comfortable seal Irrigate while breathing through your mouth May remove the straw from the bottle in order to irrigate the entire solution (important if medicine was added) Exhale through nose when finished and blow nose as necessary  Repeat on opposite side with other 1/2 of solution (120 mL) or remake solution if all 240 mL was used on first side Wash irrigation bottle regularly, replace every 3 months

## 2023-12-08 ENCOUNTER — Ambulatory Visit (HOSPITAL_BASED_OUTPATIENT_CLINIC_OR_DEPARTMENT_OTHER)
Admission: RE | Admit: 2023-12-08 | Discharge: 2023-12-08 | Disposition: A | Discharge status: Home / Self Care | Source: Ambulatory Visit | Attending: Otolaryngology | Admitting: Otolaryngology

## 2023-12-08 DIAGNOSIS — J3489 Other specified disorders of nose and nasal sinuses: Secondary | ICD-10-CM | POA: Diagnosis present

## 2023-12-08 DIAGNOSIS — R220 Localized swelling, mass and lump, head: Secondary | ICD-10-CM | POA: Insufficient documentation

## 2023-12-11 ENCOUNTER — Other Ambulatory Visit: Payer: Self-pay | Admitting: Family Medicine

## 2023-12-20 DIAGNOSIS — Z79891 Long term (current) use of opiate analgesic: Secondary | ICD-10-CM | POA: Diagnosis not present

## 2023-12-20 DIAGNOSIS — G8929 Other chronic pain: Secondary | ICD-10-CM | POA: Diagnosis not present

## 2023-12-20 DIAGNOSIS — M25551 Pain in right hip: Secondary | ICD-10-CM | POA: Diagnosis not present

## 2024-01-03 ENCOUNTER — Encounter (HOSPITAL_BASED_OUTPATIENT_CLINIC_OR_DEPARTMENT_OTHER): Payer: Self-pay | Admitting: Family Medicine

## 2024-01-03 ENCOUNTER — Ambulatory Visit (INDEPENDENT_AMBULATORY_CARE_PROVIDER_SITE_OTHER): Admitting: Family Medicine

## 2024-01-03 VITALS — BP 121/76 | HR 90 | Temp 98.2°F | Wt 183.0 lb

## 2024-01-03 DIAGNOSIS — I10 Essential (primary) hypertension: Secondary | ICD-10-CM

## 2024-01-03 DIAGNOSIS — N1832 Chronic kidney disease, stage 3b: Secondary | ICD-10-CM

## 2024-01-03 DIAGNOSIS — R42 Dizziness and giddiness: Secondary | ICD-10-CM

## 2024-01-03 MED ORDER — LOSARTAN POTASSIUM 25 MG PO TABS
25.0000 mg | ORAL_TABLET | Freq: Every day | ORAL | 3 refills | Status: DC
Start: 2024-01-03 — End: 2024-01-22

## 2024-01-03 NOTE — Progress Notes (Signed)
 Subjective:   Nicholas Cooke 09-28-1950 01/03/2024  Chief Complaint  Patient presents with   Acute Visit    Patient states on Friday he notice his BP was low 96/63 and by the weekend his BP was high 142/86. Patient states he been feeling light headed and weak for the past week. Pt states he's been having diarrhea but today a solid stool. Pt states he has pain in his back.    Discussed the use of AI scribe software for clinical note transcription with the patient, who gave verbal consent to proceed.  History of Present Illness Nicholas Cooke is a 73 year old male who presents with fatigue, diarrhea, and fluctuating blood pressure.  He has experienced a significant decrease in energy starting last week, leading to being bedridden by Friday. He describes his throat as 'funny' and had diarrhea for a few days, which has since resolved with his first solid bowel movement occurring this morning. He felt dehydrated and had difficulty eating initially, but later managed to consume soup. He also experienced lightheadedness and balance issues. No fever, chills, nausea, vomiting, palpitations, or chest pain. He experiences shortness of breath with exertion, such as climbing stairs.  His blood pressure has been fluctuating over the weekend, with readings as low as 96/63 when bedridden and as high as 142/86 when he started moving around again. His blood pressure today was 121/76. He continues to take his medications as prescribed, including lisinopril  40 mg once daily, but had taken an additional lisinopril  HCTZ from the drawer when his blood pressure was high, which is no longer in use per his wife who accompanies him to the visit today. Lisinopril -hydrochlorothiazide  was discontinued by PCP several months prior due to decreased renal function. Patient is desiring to change from lisinopril  due to cough.     The following portions of the patient's history were reviewed and updated as appropriate: past  medical history, past surgical history, family history, social history, allergies, medications, and problem list.   Patient Active Problem List   Diagnosis Date Noted   Lumbosacral spondylosis with radiculopathy 06/30/2023   Other chronic pain 06/30/2023   Lumbosacral spondylosis without myelopathy 06/30/2023   Lymphocytosis 06/27/2023   Pre-syncope 06/27/2023   Prediabetes 05/19/2023   Acute bilateral low back pain without sciatica 11/24/2022   Benign prostatic hyperplasia without lower urinary tract symptoms 05/17/2022   Stage 3b chronic kidney disease (HCC) 05/17/2022   Diverticulosis 05/17/2022   Hyperlipidemia 05/17/2022   Depression, recurrent (HCC) 05/17/2022   Essential hypertension 06/15/2020   Bilateral impacted cerumen 03/15/2018   Rhinitis 03/15/2018   Tinnitus aurium, bilateral 03/15/2018   Osteoarthritis of right knee 05/14/2015   Right knee DJD 05/14/2015   Hemiplegia affecting nondominant side (HCC) 06/13/2013   Left inguinal hernia 01/24/2012   Past Medical History:  Diagnosis Date   Chronic kidney disease    Dental crowns present    Depression    Enlarged prostate    Hypertension    states under control with med., has been on med. x 2 yr.   Inguinal hernia 12/2015   Osteoarthritis    Past Surgical History:  Procedure Laterality Date   APPENDECTOMY  yrs ago   CYSTOSCOPY WITH INSERTION OF UROLIFT N/A 07/03/2018   Procedure: CYSTOSCOPY WITH INSERTION OF UROLIFT;  Surgeon: Nieves Cough, MD;  Location: [facility];  Service: Urology;  Laterality: N/A;  ONLY NEEDS 30 MIN FOR PROCEDURE   HERNIA REPAIR  2014   INGUINAL HERNIA  REPAIR  02/16/2012   Procedure: HERNIA REPAIR INGUINAL ADULT;  Surgeon: Vicenta DELENA Poli, MD;  Location: [facility];  Service: General;  Laterality: Left;  left inguinal hernia repair with mesh   INGUINAL HERNIA REPAIR Bilateral 12/23/2015   Procedure: LAPAROSCOPIC LEFT INGUINAL HERNIA WITH MESH;   Surgeon: Vicenta Poli, MD;  Location: [facility];  Service: General;  Laterality: Bilateral;   INSERTION OF MESH Bilateral 12/23/2015   Procedure: INSERTION OF MESH;  Surgeon: Vicenta Poli, MD;  Location: [facility];  Service: General;  Laterality: Bilateral;   JOINT REPLACEMENT     KNEE ARTHROSCOPY WITH MEDIAL MENISECTOMY Right 10/18/2013   Procedure: RIGHT KNEE ARTHROSCOPY WITH PARTIAL MEDIAL MENISECTOMY;  Surgeon: Reyes JAYSON Billing, MD;  Location: [facility];  Service: Orthopedics;  Laterality: Right;   SHOULDER ARTHROSCOPY WITH ROTATOR CUFF REPAIR Left 2001   TOTAL KNEE ARTHROPLASTY Right 05/14/2015   Procedure: REMOVAL OF HARDWARE AND RIGHT TOTAL KNEE ARTHROPLASTY;  Surgeon: Reyes Billing, MD;  Location: [facility];  Service: Orthopedics;  Laterality: Right;   ULNAR NERVE REPAIR Right 1996   Family History  Problem Relation Age of Onset   Arthritis Mother    Stroke Father    Early death Sister    Outpatient Medications Prior to Visit  Medication Sig Dispense Refill   aspirin  EC 81 MG tablet Take 1 tablet (81 mg total) by mouth daily. Swallow whole. 90 tablet 1   escitalopram  (LEXAPRO ) 20 MG tablet Take 1 tablet (20 mg total) by mouth daily. 90 tablet 3   gabapentin  (NEURONTIN ) 300 MG capsule Take 300 mg by mouth 2 (two) times daily.     metFORMIN  (GLUCOPHAGE -XR) 500 MG 24 hr tablet Take 1 tablet (500 mg total) by mouth daily with breakfast. 90 tablet 3   rosuvastatin  (CRESTOR ) 10 MG tablet Take 1 tablet (10 mg total) by mouth daily. 90 tablet 3   tadalafil (CIALIS) 5 MG tablet Take 5 mg by mouth daily as needed for erectile dysfunction.     traZODone  (DESYREL ) 100 MG tablet Take 1 tablet (100 mg total) by mouth at bedtime. 90 tablet 3   lisinopril  (ZESTRIL ) 40 MG tablet Take 1 tablet (40 mg total) by mouth daily. 90 tablet 3   lisinopril -hydrochlorothiazide  (ZESTORETIC ) 20-25 MG tablet Take 1 tablet by mouth daily.     azelastine  (ASTELIN ) 0.1 % nasal  spray Place 2 sprays into both nostrils 2 (two) times daily as needed for rhinitis. Use in each nostril as directed (Patient not taking: Reported on 01/03/2024) 30 mL 12   No facility-administered medications prior to visit.   No Known Allergies   ROS: A complete ROS was performed with pertinent positives/negatives noted in the HPI. The remainder of the ROS are negative.    Objective:   Today's Vitals   01/03/24 1424  BP: 121/76  Pulse: 90  Temp: 98.2 F (36.8 C)  SpO2: 100%  Weight: 183 lb (83 kg)    Physical Exam   GENERAL: Well-appearing, in NAD. Well nourished.  SKIN: Pink, warm and dry.  Head: Normocephalic. NECK: Trachea midline. Full ROM w/o pain or tenderness. RESPIRATORY: Chest wall symmetrical. Respirations even and non-labored. Breath sounds clear to auscultation bilaterally.  CARDIAC: S1, S2 present, regular rate and rhythm without murmur or gallops. Peripheral pulses 2+ bilaterally.  MSK: Muscle tone and strength appropriate for age.  NEUROLOGIC: No motor or sensory deficits. Steady, even gait. C2-C12 intact.  PSYCH/MENTAL STATUS: Alert, oriented x 3. Cooperative, appropriate  mood and affect.     Assessment & Plan:   1. Essential hypertension (Primary) PCP reviewed medication adherence and to avoid taking lisinopril -hydrochlorothiazide  as this likely contributed to dizziness and fatigue. Will stop Lisinopril  due to dry cough and start Losartan  25mg  daily. Will check BMP with labs today and return in 4 weeks for lab check and BP check.   2. Stage 3b chronic kidney disease (HCC) Stable. Will check Bmp with labs today given recent fluid losses.   3. Dizziness Will check BMP, CBC, Magnesium  and Iron panel given dizziness over the weekend. Discussed staying hydrated and adherent to medications.  - Basic metabolic panel with GFR - CBC with Differential/Platelet - Magnesium  - Iron, TIBC and Ferritin Panel    Meds ordered this encounter  Medications   losartan   (COZAAR ) 25 MG tablet    Sig: Take 1 tablet (25 mg total) by mouth daily.    Dispense:  30 tablet    Refill:  3    Supervising Provider:   DE PERU, RAYMOND J [[id number]]   Lab Orders         Basic metabolic panel with GFR         CBC with Differential/Platelet         Magnesium          Iron, TIBC and Ferritin Panel     No images are attached to the encounter or orders placed in the encounter.  Return for 2 weeks BP check and BMP only; 3 months chronic follow up .    Patient to reach out to office if new, worrisome, or unresolved symptoms arise or if no improvement in patient's condition. Patient verbalized understanding and is agreeable to treatment plan. All questions answered to patient's satisfaction.    Thersia Schuyler Stark, [place]

## 2024-01-03 NOTE — Patient Instructions (Addendum)
 Stop Lisinopril . Start Losartan  25mg  daily.   Please check BP daily and keep a log.   Return in 2 weeks for BP check and BMP lab.

## 2024-01-04 LAB — CBC WITH DIFFERENTIAL/PLATELET
Basophils Absolute: 0 x10E3/uL (ref 0.0–0.2)
Basos: 0 %
EOS (ABSOLUTE): 0 x10E3/uL (ref 0.0–0.4)
Eos: 0 %
Hematocrit: 49.6 % (ref 37.5–51.0)
Hemoglobin: 16.3 g/dL (ref 13.0–17.7)
Immature Grans (Abs): 0 x10E3/uL (ref 0.0–0.1)
Immature Granulocytes: 0 %
Lymphocytes Absolute: 3.7 x10E3/uL — ABNORMAL HIGH (ref 0.7–3.1)
Lymphs: 41 %
MCH: 29.1 pg (ref 26.6–33.0)
MCHC: 32.9 g/dL (ref 31.5–35.7)
MCV: 88 fL (ref 79–97)
Monocytes Absolute: 0.9 x10E3/uL (ref 0.1–0.9)
Monocytes: 10 %
Neutrophils Absolute: 4.3 x10E3/uL (ref 1.4–7.0)
Neutrophils: 49 %
Platelets: 219 x10E3/uL (ref 150–450)
RBC: 5.61 x10E6/uL (ref 4.14–5.80)
RDW: 14.3 % (ref 11.6–15.4)
WBC: 9 x10E3/uL (ref 3.4–10.8)

## 2024-01-04 LAB — BASIC METABOLIC PANEL WITH GFR
BUN/Creatinine Ratio: 11 (ref 10–24)
BUN: 19 mg/dL (ref 8–27)
CO2: 20 mmol/L (ref 20–29)
Calcium: 9.4 mg/dL (ref 8.6–10.2)
Chloride: 99 mmol/L (ref 96–106)
Creatinine, Ser: 1.66 mg/dL — ABNORMAL HIGH (ref 0.76–1.27)
Glucose: 77 mg/dL (ref 70–99)
Potassium: 4.3 mmol/L (ref 3.5–5.2)
Sodium: 137 mmol/L (ref 134–144)
eGFR: 44 mL/min/1.73 — ABNORMAL LOW (ref >59)

## 2024-01-04 LAB — IRON,TIBC AND FERRITIN PANEL
Ferritin: 397 ng/mL (ref 30–400)
Iron Saturation: 20 % (ref 15–55)
Iron: 61 ug/dL (ref 38–169)
Total Iron Binding Capacity: 301 ug/dL (ref 250–450)
UIBC: 240 ug/dL (ref 111–343)

## 2024-01-04 LAB — MAGNESIUM: Magnesium: 2.1 mg/dL (ref 1.6–2.3)

## 2024-01-09 ENCOUNTER — Ambulatory Visit (HOSPITAL_BASED_OUTPATIENT_CLINIC_OR_DEPARTMENT_OTHER): Payer: Self-pay | Admitting: Family Medicine

## 2024-01-09 ENCOUNTER — Other Ambulatory Visit: Payer: Self-pay | Admitting: Nurse Practitioner

## 2024-01-09 ENCOUNTER — Ambulatory Visit
Admission: RE | Admit: 2024-01-09 | Discharge: 2024-01-09 | Disposition: A | Discharge status: Home / Self Care | Source: Ambulatory Visit | Attending: Nurse Practitioner | Admitting: Nurse Practitioner

## 2024-01-09 DIAGNOSIS — M25551 Pain in right hip: Secondary | ICD-10-CM

## 2024-01-09 DIAGNOSIS — M160 Bilateral primary osteoarthritis of hip: Secondary | ICD-10-CM | POA: Diagnosis not present

## 2024-01-09 NOTE — Progress Notes (Signed)
 Hi Lela,  Your electrolytes and kidney function is stable. No signs of anemia present upon your blood counts. Your magnesium  is in a normal range and your iron is normal. If you have further questions, please let me know.

## 2024-01-10 NOTE — Therapy (Unsigned)
 OUTPATIENT PHYSICAL THERAPY THORACOLUMBAR EVALUATION   Patient Name: Nicholas Cooke MRN: [id number] DOB:10/12/1950, 73 y.o., male Today's Date: 01/11/2024  END OF SESSION:  PT End of Session - 01/11/24 0807     Visit Number 1    Number of Visits 8    Date for PT Re-Evaluation 03/07/24    Authorization Type [employer] MCR    PT Start Time 0800    PT Stop Time 0850    PT Time Calculation (min) 50 min    Activity Tolerance Patient tolerated treatment well    Behavior During Therapy WFL for tasks assessed/performed          Past Medical History:  Diagnosis Date   Chronic kidney disease    Dental crowns present    Depression    Enlarged prostate    Hypertension    states under control with med., has been on med. x 2 yr.   Inguinal hernia 12/2015   Osteoarthritis    Past Surgical History:  Procedure Laterality Date   APPENDECTOMY  yrs ago   CYSTOSCOPY WITH INSERTION OF UROLIFT N/A 07/03/2018   Procedure: CYSTOSCOPY WITH INSERTION OF UROLIFT;  Surgeon: Nieves Cough, MD;  Location: [facility];  Service: Urology;  Laterality: N/A;  ONLY NEEDS 30 MIN FOR PROCEDURE   HERNIA REPAIR  2014   INGUINAL HERNIA REPAIR  02/16/2012   Procedure: HERNIA REPAIR INGUINAL ADULT;  Surgeon: Vicenta DELENA Poli, MD;  Location: [facility];  Service: General;  Laterality: Left;  left inguinal hernia repair with mesh   INGUINAL HERNIA REPAIR Bilateral 12/23/2015   Procedure: LAPAROSCOPIC LEFT INGUINAL HERNIA WITH MESH;  Surgeon: Vicenta Poli, MD;  Location: [facility];  Service: General;  Laterality: Bilateral;   INSERTION OF MESH Bilateral 12/23/2015   Procedure: INSERTION OF MESH;  Surgeon: Vicenta Poli, MD;  Location: [facility];  Service: General;  Laterality: Bilateral;   JOINT REPLACEMENT     KNEE ARTHROSCOPY WITH MEDIAL MENISECTOMY Right 10/18/2013   Procedure: RIGHT KNEE ARTHROSCOPY WITH PARTIAL MEDIAL  MENISECTOMY;  Surgeon: Reyes JAYSON Billing, MD;  Location: [facility];  Service: Orthopedics;  Laterality: Right;   SHOULDER ARTHROSCOPY WITH ROTATOR CUFF REPAIR Left 2001   TOTAL KNEE ARTHROPLASTY Right 05/14/2015   Procedure: REMOVAL OF HARDWARE AND RIGHT TOTAL KNEE ARTHROPLASTY;  Surgeon: Reyes Billing, MD;  Location: [facility];  Service: Orthopedics;  Laterality: Right;   ULNAR NERVE REPAIR Right 1996   Patient Active Problem List   Diagnosis Date Noted   Lumbosacral spondylosis with radiculopathy 06/30/2023   Other chronic pain 06/30/2023   Lumbosacral spondylosis without myelopathy 06/30/2023   Lymphocytosis 06/27/2023   Pre-syncope 06/27/2023   Prediabetes 05/19/2023   Acute bilateral low back pain without sciatica 11/24/2022   Benign prostatic hyperplasia without lower urinary tract symptoms 05/17/2022   Stage 3b chronic kidney disease (HCC) 05/17/2022   Diverticulosis 05/17/2022   Hyperlipidemia 05/17/2022   Depression, recurrent (HCC) 05/17/2022   Essential hypertension 06/15/2020   Bilateral impacted cerumen 03/15/2018   Rhinitis 03/15/2018   Tinnitus aurium, bilateral 03/15/2018   Osteoarthritis of right knee 05/14/2015   Right knee DJD 05/14/2015   Hemiplegia affecting nondominant side (HCC) 06/13/2013   Left inguinal hernia 01/24/2012    PCP: Knute Thersia Bitters FNP  REFERRING PROVIDER: Garnell Harlene CROME, FNP  REFERRING DIAG: [phone] (ICD-10-CM) - Other spondylosis with radiculopathy, lumbosacral region  Rationale for Evaluation and Treatment: Rehabilitation  THERAPY DIAG:  Other low back  pain  Joint stiffness of both hips  Difficulty in walking, not elsewhere classified  ONSET DATE: chronic   SUBJECTIVE:                                                                                                                                                                                           SUBJECTIVE STATEMENT: Patient was referred from primary care provider.   Patient reports back pain extending into his LLE at times.  He has has difficulty mowing the lawn, standing, bending and tying shoes.  Described as sharp pain spreading out to both sides.  With standing, pain is in the post hips on fire,  pain goes to the ankle.  He feels like his neck is affected as well.  He has had shots for his cervical and lumbar spine. Patient has had PT before and it worsened the pain.  Patient would like to be able to live with this pain, does not currently stretch but reports walking 15 min around the block once a day. Walking does not increase his back pain in this amount of time but may with longer distances.     PERTINENT HISTORY:  Rt TKA L hip pain  HTN Kidney ds   PAIN:  Are you having pain? Yes: NPRS scale: 4/10 Pain location: low back  Pain description: aching  Aggravating factors: standing 15-20 min  Relieving factors: lying down, reclined position  PRECAUTIONS: None  RED FLAGS: None   WEIGHT BEARING RESTRICTIONS: No  FALLS:  Has patient fallen in last 6 months? Yes. Number of falls 1, fell down steps (slipper) and I fell in the kitchen x 2 , episodes of dehydration  Denies balance issues    LIVING ENVIRONMENT: Lives with: lives with their spouse Lives in: House/apartment Stairs: Yes: Internal: 12 steps; on right going up and External: a few  steps; on right going up Has following equipment at home: Single point cane  OCCUPATION: retred   PLOF: Independent, recreationally he is limited by pain (likes basketball)   PATIENT GOALS: I want to pain relief   NEXT MD VISIT: Tues. 01/15/24  OBJECTIVE:  Note: Objective measures were completed at Evaluation unless otherwise noted.  DIAGNOSTIC FINDINGS:  EXAM: LUMBAR SPINE - 3 VIEW   COMPARISON:  Lumbar spine radiograph dated 11/01/2019   FINDINGS: There is no evidence of lumbar spine fracture. Alignment is normal. Intervertebral disc spaces are maintained. Lower lumbar facet arthropathy.  Radiopaque surgical clips project over the midline pelvis.   IMPRESSION: Lower lumbar facet arthropathy. No acute fracture or traumatic listhesis.    CLINICAL DATA:  Right hip pain, no recent  injury   EXAM: DG HIP (WITH OR WITHOUT PELVIS) 3-4V BILAT   COMPARISON:  None Available.   FINDINGS: Mild osteoarthritis changes in the hips with joint space narrowing and spurring, right slightly greater than left. SI joints symmetric. No acute bony abnormality. Specifically, no fracture, subluxation, or dislocation. Radiation seeds in the region of the prostate.   IMPRESSION: Mild osteoarthritis in the hips bilaterally, right slightly greater than left.   No acute bony abnormality.    PATIENT SURVEYS:  Modified Oswestry:  MODIFIED OSWESTRY DISABILITY SCALE  Date: 01/11/24 Score  Pain intensity 3 =  Pain medication provides me with moderate relief from pain.  2. Personal care (washing, dressing, etc.) 1 =  I can take care of myself normally, but it increases my pain.  3. Lifting 3 = Pain prevents me from lifting heavy weights, but I can manage (5) I have hardly any social life because of my pain. light to medium weights if they are conveniently positioned  4. Walking 2 =  Pain prevents me from walking more than  mile.  5. Sitting 2 =  Pain prevents me from sitting more than 1 hour.  6. Standing 3 =  Pain prevents me from standing more than 1/2 hour.  7. Sleeping 1 = I can sleep well only by using pain medication.  8. Social Life 4 =  Pain has restricted my social life to my home  9. Traveling 2 =  My pain restricts my travel over 2 hours.  10. Employment/ Homemaking 2 = I can perform most of my homemaking/job duties, but pain prevents me from performing more physically stressful activities (eg, lifting, vacuuming).  Total 23/50   Interpretation of scores: Score Category Description  0-20% Minimal Disability The patient can cope with most living activities. Usually no treatment is  indicated apart from advice on lifting, sitting and exercise  21-40% Moderate Disability The patient experiences more pain and difficulty with sitting, lifting and standing. Travel and social life are more difficult and they may be disabled from work. Personal care, sexual activity and sleeping are not grossly affected, and the patient can usually be managed by conservative means  41-60% Severe Disability Pain remains the main problem in this group, but activities of daily living are affected. These patients require a detailed investigation  61-80% Crippled Back pain impinges on all aspects of the patient's life. Positive intervention is required  81-100% Bed-bound  These patients are either bed-bound or exaggerating their symptoms  Bluford FORBES Zoe DELENA Karon DELENA, et al. Surgery versus conservative management of stable thoracolumbar fracture: the PRESTO feasibility RCT. [place] ([place]): [employer]; 2021 Nov. [facility] Technology Assessment, No. 25.62.) Appendix 3, Oswestry Disability Index category descriptors. Available from: FindJewelers.cz  Minimally Clinically Important Difference (MCID) = 12.8%  COGNITION: Overall cognitive status: Within functional limits for tasks assessed     SENSATION: LLE tingling and numbness intermittent   MUSCLE LENGTH: Hamstrings: Rt tight, passive SLR to 45 deg, Lt. Passive SLR to 35 deg with back pain  Thomas test: NT but very tight   POSTURE: rounded shoulders, forward head, and loss of Lumbar curve   PALPATION: Pain along bilateral low lumbar spine and across to both sides into bilateral hips , glutes    LUMBAR ROM:   AROM eval  Flexion Hands to knees with knees unlocked   Extension Feels good   Right lateral flexion Min   Left lateral flexion Min   Right rotation 75%  Left rotation  75%   (Blank rows = not tested)  LOWER EXTREMITY ROM:   PROM limited in hip extension, abduction and ER/IR not measured     LOWER EXTREMITY MMT:    MMT Right eval Left eval  Hip flexion 4 4  Hip extension 3 3  Hip abduction 3+ 3+  Hip adduction    Hip internal rotation    Hip external rotation    Knee flexion 5 5  Knee extension 5 5  Ankle dorsiflexion 5 5  Ankle plantarflexion    Ankle inversion    Ankle eversion     (Blank rows = not tested)  LUMBAR SPECIAL TESTS:  Straight leg raise test: PositiveLLE  FUNCTIONAL TESTS:  NT on eval   GAIT: Distance walked: 100  Assistive device utilized: None Level of assistance: Modified independence Comments: slow pace , min limp Rt knee   TREATMENT DATE:   [facility] Adult PT Treatment:                                                DATE: 01/11/24 T Self Care: HEP, explanation of rationale of the exercises and POC, hurt not harm and importance of daily mobility routine for long term benefit, health.        Body mechanics and hip hinge to save back and knees                                                                                                      PATIENT EDUCATION:  Education details: see above self care  Person educated: Patient Education method: Explanation, Demonstration, Verbal cues, and Handouts Education comprehension: verbalized understanding and needs further education  HOME EXERCISE PROGRAM: Access Code: [email or web address] URL: [email or web address] Date: 01/11/2024 Prepared by: Delon Norma  Exercises - Supine Lower Trunk Rotation  - 1-2 x daily - 7 x weekly - 1 sets - 10 reps - 10 hold - Hooklying Single Knee to Chest Stretch  - 1-2 x daily - 7 x weekly - 1 sets - 3 reps - 30 hold - Supine Hamstring Stretch  - 1-2 x daily - 7 x weekly - 1 sets - 5-10 reps - 10 hold - Sit to Stand with Arm Reach Toward Target  - 1 x daily - 7 x weekly - 2 sets - 10 reps - 5 hold  ASSESSMENT:  CLINICAL IMPRESSION: Patient is a 73 y.o. male who was seen today for physical therapy evaluation and treatment for low back pain with  radiating pain into L>R LE. He has previously had PT which gave him no benefit and so he is understandably skeptical. Discussed his ONE takeaway being a proper hip hinge to reduce back pain with functional mobility   OBJECTIVE IMPAIRMENTS: decreased activity tolerance, decreased balance, decreased coordination, decreased endurance, decreased knowledge of condition, decreased mobility, difficulty walking, decreased ROM, decreased strength, hypomobility, increased fascial restrictions, impaired flexibility, impaired sensation, improper body mechanics, postural dysfunction, and pain.  ACTIVITY LIMITATIONS: carrying, lifting, bending, sitting, standing, squatting, sleeping, stairs, bed mobility, and locomotion level  PARTICIPATION LIMITATIONS: cleaning, interpersonal relationship, driving, shopping, community activity, and yard work  PERSONAL FACTORS: Age, Past/current experiences, Time since onset of injury/illness/exacerbation, and 3+ comorbidities: chronic back pain, neck pain, Rt knee and L hip  are also affecting patient's functional outcome.   REHAB POTENTIAL: Fair due to chronicity and previous experience of PT   CLINICAL DECISION MAKING: Stable/uncomplicated  EVALUATION COMPLEXITY: Low   GOALS: Goals reviewed with patient? Yes  SHORT TERM GOALS: Target date: 02/08/2024  Patient will be able to show independence for initial HEP to include posture, core and hip strength and stability.   Baseline: Goal status: INITIAL  2.  Pt will complete 2 min walk test and set goal  Baseline:  Goal status: INITIAL  3.  Pt will begin to understand proper hip hinge for ADLs and in clinic exercises  Baseline:  Goal status: INITIAL  LONG TERM GOALS: Target date: 03/07/2024    Patient will be independent with final HEP upon discharge from PT and report consistent benefit following exercise completion.    Baseline: unknown Goal status: INITIAL  2.  Patient will be able to stand/walk for 30 min  with no more than min increasing pain in back, legs  Baseline: 15 min  Goal status: INITIAL  3.  Patient will be able to improve 2 min walk test distance by 50 feet or more with LRAD and no increase in pain.   Baseline:  Goal status: INITIAL  4.  Patient will be able to demonstrate proper posture and lifting techniques related to spine health and reduction of symptoms.  Baseline:  Goal status: INITIAL  5.  Pt will report 25% improvement in LE symptoms (radicular) to demo improved neural tension  Baseline:  Goal status: INITIAL  PLAN:  PT FREQUENCY: 1-2x/week  PT DURATION: 8 weeks  PLANNED INTERVENTIONS: 97164- PT Re-evaluation, 97750- Physical Performance Testing, 97110-Therapeutic exercises, 97530- Therapeutic activity, [id number]- Neuromuscular re-education, 97535- Self Care, [id number]- Manual therapy, [id number]- Gait training, [phone]- Aquatic Therapy, [phone] (1-2 muscles), [id number] (3+ muscles)- Dry Needling, Patient/Family education, Balance training, Joint mobilization, Spinal mobilization, Cryotherapy, and Moist heat.  PLAN FOR NEXT SESSION: check HEP, NuStep   Eternity Dexter, PT 01/11/2024, 1:22 PM   Date of referral: 12/05/23 Referring provider: Knute Thersia Bitters FNP Referring diagnosis? M47.27 (ICD-10-CM) - Other spondylosis with radiculopathy, lumbosacral region Treatment diagnosis? (if different than referring diagnosis) back pain, difficulty walking and stiffness of spine   What was this (referring dx) caused by? Ongoing Issue and Arthritis  Lysle of Condition: Chronic (continuous duration > 3 months)   Laterality: Lt  Current Functional Measure Score: Back Index ODI 23/50  Objective measurements identify impairments when they are compared to normal values, the uninvolved extremity, and prior level of function.  [x]  Yes  []  No  Objective assessment of functional ability: Moderate functional limitations   Briefly describe symptoms: spine stiffness, pain and radicular  symptoms, sensory symptoms in LEs   How did symptoms start: gradual over time and since 90's   Average pain intensity:  Last 24 hours: 4/10-/6/10  Past week: 3/10-8/10   How often does the pt experience symptoms? Constantly  How much have the symptoms interfered with usual daily activities? Moderately  How has condition changed since care began at this facility? NA - initial visit  In general, how is the patients overall health? Fair   BACK PAIN (STarT Back Screening Tool) Has pain  spread down the leg(s) at some time in the last 2 weeks? YES Has there been pain in the shoulder or neck at some time in the last 2 weeks? YES Has the pt only walked short distances because of back pain? YES Has patient dressed more slowly because of back pain in the past 2 weeks? YES Does patient think it's not safe for a person with this condition to be physically active? NO Does patient have worrying thoughts a lot of the time? NO Does patient feel back pain is terrible and will never get any better? YES Has patient stopped enjoying things they usually enjoy? YES

## 2024-01-11 ENCOUNTER — Ambulatory Visit: Attending: Nurse Practitioner | Admitting: Physical Therapy

## 2024-01-11 ENCOUNTER — Encounter: Payer: Self-pay | Admitting: Physical Therapy

## 2024-01-11 DIAGNOSIS — R262 Difficulty in walking, not elsewhere classified: Secondary | ICD-10-CM | POA: Diagnosis not present

## 2024-01-11 DIAGNOSIS — M25651 Stiffness of right hip, not elsewhere classified: Secondary | ICD-10-CM | POA: Insufficient documentation

## 2024-01-11 DIAGNOSIS — M5459 Other low back pain: Secondary | ICD-10-CM | POA: Diagnosis not present

## 2024-01-11 DIAGNOSIS — M25652 Stiffness of left hip, not elsewhere classified: Secondary | ICD-10-CM | POA: Diagnosis not present

## 2024-01-16 ENCOUNTER — Ambulatory Visit (INDEPENDENT_AMBULATORY_CARE_PROVIDER_SITE_OTHER): Admitting: *Deleted

## 2024-01-16 VITALS — BP 131/85

## 2024-01-16 DIAGNOSIS — I10 Essential (primary) hypertension: Secondary | ICD-10-CM

## 2024-01-16 NOTE — Progress Notes (Signed)
 Patient came in today for a BP check. BP documented. Pt states he did take BP meds this morning.

## 2024-01-17 DIAGNOSIS — Z79891 Long term (current) use of opiate analgesic: Secondary | ICD-10-CM | POA: Diagnosis not present

## 2024-01-17 DIAGNOSIS — M545 Low back pain, unspecified: Secondary | ICD-10-CM | POA: Diagnosis not present

## 2024-01-17 DIAGNOSIS — G8929 Other chronic pain: Secondary | ICD-10-CM | POA: Diagnosis not present

## 2024-01-18 NOTE — Therapy (Signed)
 OUTPATIENT PHYSICAL THERAPY THORACOLUMBAR TREATMENT   Patient Name: Nicholas Cooke MRN: [id number] DOB:Aug 14, 1950, 73 y.o., male Today's Date: 01/19/2024  END OF SESSION:  PT End of Session - 01/19/24 0929     Visit Number 2    Number of Visits 8    Date for PT Re-Evaluation 03/07/24    Authorization Type [employer] MCR    PT Start Time 0930    PT Stop Time 1010    PT Time Calculation (min) 40 min    Activity Tolerance Patient tolerated treatment well    Behavior During Therapy WFL for tasks assessed/performed           Past Medical History:  Diagnosis Date   Chronic kidney disease    Dental crowns present    Depression    Enlarged prostate    Hypertension    states under control with med., has been on med. x 2 yr.   Inguinal hernia 12/2015   Osteoarthritis    Past Surgical History:  Procedure Laterality Date   APPENDECTOMY  yrs ago   CYSTOSCOPY WITH INSERTION OF UROLIFT N/A 07/03/2018   Procedure: CYSTOSCOPY WITH INSERTION OF UROLIFT;  Surgeon: Nieves Cough, MD;  Location: [facility];  Service: Urology;  Laterality: N/A;  ONLY NEEDS 30 MIN FOR PROCEDURE   HERNIA REPAIR  2014   INGUINAL HERNIA REPAIR  02/16/2012   Procedure: HERNIA REPAIR INGUINAL ADULT;  Surgeon: Vicenta DELENA Poli, MD;  Location: [facility];  Service: General;  Laterality: Left;  left inguinal hernia repair with mesh   INGUINAL HERNIA REPAIR Bilateral 12/23/2015   Procedure: LAPAROSCOPIC LEFT INGUINAL HERNIA WITH MESH;  Surgeon: Vicenta Poli, MD;  Location: [facility];  Service: General;  Laterality: Bilateral;   INSERTION OF MESH Bilateral 12/23/2015   Procedure: INSERTION OF MESH;  Surgeon: Vicenta Poli, MD;  Location: [facility];  Service: General;  Laterality: Bilateral;   JOINT REPLACEMENT     KNEE ARTHROSCOPY WITH MEDIAL MENISECTOMY Right 10/18/2013   Procedure: RIGHT KNEE ARTHROSCOPY WITH PARTIAL MEDIAL  MENISECTOMY;  Surgeon: Reyes JAYSON Billing, MD;  Location: [facility];  Service: Orthopedics;  Laterality: Right;   SHOULDER ARTHROSCOPY WITH ROTATOR CUFF REPAIR Left 2001   TOTAL KNEE ARTHROPLASTY Right 05/14/2015   Procedure: REMOVAL OF HARDWARE AND RIGHT TOTAL KNEE ARTHROPLASTY;  Surgeon: Reyes Billing, MD;  Location: [facility];  Service: Orthopedics;  Laterality: Right;   ULNAR NERVE REPAIR Right 1996   Patient Active Problem List   Diagnosis Date Noted   Lumbosacral spondylosis with radiculopathy 06/30/2023   Other chronic pain 06/30/2023   Lumbosacral spondylosis without myelopathy 06/30/2023   Lymphocytosis 06/27/2023   Pre-syncope 06/27/2023   Prediabetes 05/19/2023   Acute bilateral low back pain without sciatica 11/24/2022   Benign prostatic hyperplasia without lower urinary tract symptoms 05/17/2022   Stage 3b chronic kidney disease (HCC) 05/17/2022   Diverticulosis 05/17/2022   Hyperlipidemia 05/17/2022   Depression, recurrent (HCC) 05/17/2022   Essential hypertension 06/15/2020   Bilateral impacted cerumen 03/15/2018   Rhinitis 03/15/2018   Tinnitus aurium, bilateral 03/15/2018   Osteoarthritis of right knee 05/14/2015   Right knee DJD 05/14/2015   Hemiplegia affecting nondominant side (HCC) 06/13/2013   Left inguinal hernia 01/24/2012    PCP: Knute Thersia Bitters FNP  REFERRING PROVIDER: Garnell Harlene CROME, FNP  REFERRING DIAG: [phone] (ICD-10-CM) - Other spondylosis with radiculopathy, lumbosacral region  Rationale for Evaluation and Treatment: Rehabilitation  THERAPY DIAG:  Other low  back pain  Joint stiffness of both hips  Difficulty in walking, not elsewhere classified  ONSET DATE: chronic   SUBJECTIVE:                                                                                                                                                                                           SUBJECTIVE STATEMENT: Pt reports he has been able to complete his hamstring  stretching every other day. Pt notes he back pain is about the same.   EVAL: Patient was referred from primary care provider.  Patient reports back pain extending into his LLE at times.  He has has difficulty mowing the lawn, standing, bending and tying shoes.  Described as sharp pain spreading out to both sides.  With standing, pain is in the post hips on fire,  pain goes to the ankle.  He feels like his neck is affected as well.  He has had shots for his cervical and lumbar spine. Patient has had PT before and it worsened the pain.  Patient would like to be able to live with this pain, does not currently stretch but reports walking 15 min around the block once a day. Walking does not increase his back pain in this amount of time but may with longer distances.     PERTINENT HISTORY:  Rt TKA L hip pain  HTN Kidney ds   PAIN:  Are you having pain? Yes: NPRS scale: 3/10 Pain location: low back  Pain description: aching  Aggravating factors: standing 15-20 min  Relieving factors: lying down, reclined position  PRECAUTIONS: None  RED FLAGS: None   WEIGHT BEARING RESTRICTIONS: No  FALLS:  Has patient fallen in last 6 months? Yes. Number of falls 1, fell down steps (slipper) and I fell in the kitchen x 2 , episodes of dehydration  Denies balance issues    LIVING ENVIRONMENT: Lives with: lives with their spouse Lives in: House/apartment Stairs: Yes: Internal: 12 steps; on right going up and External: a few  steps; on right going up Has following equipment at home: Single point cane  OCCUPATION: retred   PLOF: Independent, recreationally he is limited by pain (likes basketball)   PATIENT GOALS: I want to pain relief   NEXT MD VISIT: Tues. 01/15/24  OBJECTIVE:  Note: Objective measures were completed at Evaluation unless otherwise noted.  DIAGNOSTIC FINDINGS:  EXAM: LUMBAR SPINE - 3 VIEW   COMPARISON:  Lumbar spine radiograph dated 11/01/2019   FINDINGS: There is no  evidence of lumbar spine fracture. Alignment is normal. Intervertebral disc spaces are maintained. Lower lumbar facet arthropathy. Radiopaque surgical clips project over  the midline pelvis.   IMPRESSION: Lower lumbar facet arthropathy. No acute fracture or traumatic listhesis.    CLINICAL DATA:  Right hip pain, no recent injury   EXAM: DG HIP (WITH OR WITHOUT PELVIS) 3-4V BILAT   COMPARISON:  None Available.   FINDINGS: Mild osteoarthritis changes in the hips with joint space narrowing and spurring, right slightly greater than left. SI joints symmetric. No acute bony abnormality. Specifically, no fracture, subluxation, or dislocation. Radiation seeds in the region of the prostate.   IMPRESSION: Mild osteoarthritis in the hips bilaterally, right slightly greater than left.   No acute bony abnormality.    PATIENT SURVEYS:  Modified Oswestry:  MODIFIED OSWESTRY DISABILITY SCALE  Date: 01/11/24 Score  Pain intensity 3 =  Pain medication provides me with moderate relief from pain.  2. Personal care (washing, dressing, etc.) 1 =  I can take care of myself normally, but it increases my pain.  3. Lifting 3 = Pain prevents me from lifting heavy weights, but I can manage (5) I have hardly any social life because of my pain. light to medium weights if they are conveniently positioned  4. Walking 2 =  Pain prevents me from walking more than  mile.  5. Sitting 2 =  Pain prevents me from sitting more than 1 hour.  6. Standing 3 =  Pain prevents me from standing more than 1/2 hour.  7. Sleeping 1 = I can sleep well only by using pain medication.  8. Social Life 4 =  Pain has restricted my social life to my home  9. Traveling 2 =  My pain restricts my travel over 2 hours.  10. Employment/ Homemaking 2 = I can perform most of my homemaking/job duties, but pain prevents me from performing more physically stressful activities (eg, lifting, vacuuming).  Total 23/50   Interpretation of  scores: Score Category Description  0-20% Minimal Disability The patient can cope with most living activities. Usually no treatment is indicated apart from advice on lifting, sitting and exercise  21-40% Moderate Disability The patient experiences more pain and difficulty with sitting, lifting and standing. Travel and social life are more difficult and they may be disabled from work. Personal care, sexual activity and sleeping are not grossly affected, and the patient can usually be managed by conservative means  41-60% Severe Disability Pain remains the main problem in this group, but activities of daily living are affected. These patients require a detailed investigation  61-80% Crippled Back pain impinges on all aspects of the patient's life. Positive intervention is required  81-100% Bed-bound  These patients are either bed-bound or exaggerating their symptoms  Bluford FORBES Zoe DELENA Karon DELENA, et al. Surgery versus conservative management of stable thoracolumbar fracture: the PRESTO feasibility RCT. [place] ([place]): [employer]; 2021 Nov. [facility] Technology Assessment, No. 25.62.) Appendix 3, Oswestry Disability Index category descriptors. Available from: FindJewel[employer].cz  Minimally Clinically Important Difference (MCID) = 12.8%  COGNITION: Overall cognitive status: Within functional limits for tasks assessed     SENSATION: LLE tingling and numbness intermittent   MUSCLE LENGTH: Hamstrings: Rt tight, passive SLR to 45 deg, Lt. Passive SLR to 35 deg with back pain  Thomas test: NT but very tight   POSTURE: rounded shoulders, forward head, and loss of Lumbar curve   PALPATION: Pain along bilateral low lumbar spine and across to both sides into bilateral hips , glutes    LUMBAR ROM:   AROM eval  Flexion Hands to knees with  knees unlocked   Extension Feels good   Right lateral flexion Min   Left lateral flexion Min   Right rotation 75%  Left  rotation 75%   (Blank rows = not tested)  LOWER EXTREMITY ROM:   PROM limited in hip extension, abduction and ER/IR not measured    LOWER EXTREMITY MMT:    MMT Right eval Left eval  Hip flexion 4 4  Hip extension 3 3  Hip abduction 3+ 3+  Hip adduction    Hip internal rotation    Hip external rotation    Knee flexion 5 5  Knee extension 5 5  Ankle dorsiflexion 5 5  Ankle plantarflexion    Ankle inversion    Ankle eversion     (Blank rows = not tested)  LUMBAR SPECIAL TESTS:  Straight leg raise test: PositiveLLE  FUNCTIONAL TESTS:  NT on eval   GAIT: Distance walked: 100  Assistive device utilized: None Level of assistance: Modified independence Comments: slow pace , min limp Rt knee   TREATMENT DATE:  [facility] Adult PT Treatment:                                                DATE: 01/19/24 Therapeutic Exercise: Supine Lower Trunk Rotation x5 10 Hooklying Single Knee to Chest Stretch  x3 20 Supine Hamstring Stretch  x3 20 Supine Bridge x10 3 Supine Transversus Abdominis Bracing x10 3 Supine hip clams BluTB x12 3 Sit to Stand with Arm Reach Toward Target  x10 Shoulder Extension with Resistance  2x10 GTB Updated HEP  [facility] Adult PT Treatment:                                                DATE: 01/11/24 Self Care: HEP, explanation of rationale of the exercises and POC, hurt not harm and importance of daily mobility routine for long term benefit, health.        Body mechanics and hip hinge to save back and knees                                                                                                    PATIENT EDUCATION:  Education details: see above self care  Person educated: Patient Education method: Explanation, Demonstration, Verbal cues, and Handouts Education comprehension: verbalized understanding and needs further education  HOME EXERCISE PROGRAM: Access Code: [email or web address] URL: [email or web address] Date: 01/19/2024 Prepared by:  Dasie Daft  Exercises - Supine Lower Trunk Rotation  - 1-2 x daily - 7 x weekly - 1 sets - 10 reps - 10 hold - Hooklying Single Knee to Chest Stretch  - 1-2 x daily - 7 x weekly - 1 sets - 3 reps - 30 hold - Supine Hamstring Stretch  - 1-2 x daily - 7 x weekly - 1 sets - 5-10  reps - 10 hold - Sit to Stand with Arm Reach Toward Target  - 1 x daily - 7 x weekly - 1-2 sets - 10 reps - 5 hold - Supine Bridge  - 1 x daily - 7 x weekly - 1-2 sets - 10 reps - 3 hold - Supine Transversus Abdominis Bracing - Hands on Ground  - 1 x daily - 7 x weekly - 1-2 sets - 10 reps - 3 hold - Shoulder Extension with Resistance  - 1 x daily - 7 x weekly - 1-2 sets - 10 reps - 3 hold  ASSESSMENT:  CLINICAL IMPRESSION: PT was completed for lumbopelvic flexibility and strengthening. Verbal cueing was provided for most proper technique esp with TA bracing. Pt tolerated prescribed exs in PT today without adverse effects. Pt will continue to benefit from skilled PT to address impairments for improved back function with  minimized pain.  EVAL: Patient is a 73 y.o. male who was seen today for physical therapy evaluation and treatment for low back pain with radiating pain into L>R LE. He has previously had PT which gave him no benefit and so he is understandably skeptical. Discussed his ONE takeaway being a proper hip hinge to reduce back pain with functional mobility   OBJECTIVE IMPAIRMENTS: decreased activity tolerance, decreased balance, decreased coordination, decreased endurance, decreased knowledge of condition, decreased mobility, difficulty walking, decreased ROM, decreased strength, hypomobility, increased fascial restrictions, impaired flexibility, impaired sensation, improper body mechanics, postural dysfunction, and pain.   ACTIVITY LIMITATIONS: carrying, lifting, bending, sitting, standing, squatting, sleeping, stairs, bed mobility, and locomotion level  PARTICIPATION LIMITATIONS: cleaning, interpersonal  relationship, driving, shopping, community activity, and yard work  PERSONAL FACTORS: Age, Past/current experiences, Time since onset of injury/illness/exacerbation, and 3+ comorbidities: chronic back pain, neck pain, Rt knee and L hip  are also affecting patient's functional outcome.   REHAB POTENTIAL: Fair due to chronicity and previous experience of PT   CLINICAL DECISION MAKING: Stable/uncomplicated  EVALUATION COMPLEXITY: Low   GOALS: Goals reviewed with patient? Yes  SHORT TERM GOALS: Target date: 02/08/2024  Patient will be able to show independence for initial HEP to include posture, core and hip strength and stability.   Baseline: Goal status: INITIAL  2.  Pt will complete 2 min walk test and set goal  Baseline:  Goal status: INITIAL  3.  Pt will begin to understand proper hip hinge for ADLs and in clinic exercises  Baseline:  Goal status: INITIAL  LONG TERM GOALS: Target date: 03/07/2024    Patient will be independent with final HEP upon discharge from PT and report consistent benefit following exercise completion.    Baseline: unknown Goal status: INITIAL  2.  Patient will be able to stand/walk for 30 min with no more than min increasing pain in back, legs  Baseline: 15 min  Goal status: INITIAL  3.  Patient will be able to improve 2 min walk test distance by 50 feet or more with LRAD and no increase in pain.   Baseline:  Goal status: INITIAL  4.  Patient will be able to demonstrate proper posture and lifting techniques related to spine health and reduction of symptoms.  Baseline:  Goal status: INITIAL  5.  Pt will report 25% improvement in LE symptoms (radicular) to demo improved neural tension  Baseline:  Goal status: INITIAL  PLAN:  PT FREQUENCY: 1-2x/week  PT DURATION: 8 weeks  PLANNED INTERVENTIONS: 97164- PT Re-evaluation, 97750- Physical Performance Testing, 97110-Therapeutic exercises, 97530- Therapeutic activity,  02887- Neuromuscular  re-education, [phone]- Self Care, 02859- Manual therapy, [id number]- Gait training, [phone]- Aquatic Therapy, [phone] (1-2 muscles), [id number] (3+ muscles)- Dry Needling, Patient/Family education, Balance training, Joint mobilization, Spinal mobilization, Cryotherapy, and Moist heat.  PLAN FOR NEXT SESSION: check HEP, NuStep   Gemini Beaumier MS, PT 01/21/24 6:16 PM

## 2024-01-19 ENCOUNTER — Ambulatory Visit: Attending: Nurse Practitioner

## 2024-01-19 DIAGNOSIS — M25651 Stiffness of right hip, not elsewhere classified: Secondary | ICD-10-CM | POA: Diagnosis not present

## 2024-01-19 DIAGNOSIS — M25652 Stiffness of left hip, not elsewhere classified: Secondary | ICD-10-CM | POA: Diagnosis not present

## 2024-01-19 DIAGNOSIS — R262 Difficulty in walking, not elsewhere classified: Secondary | ICD-10-CM | POA: Diagnosis not present

## 2024-01-19 DIAGNOSIS — M5459 Other low back pain: Secondary | ICD-10-CM | POA: Diagnosis not present

## 2024-01-22 ENCOUNTER — Other Ambulatory Visit (HOSPITAL_BASED_OUTPATIENT_CLINIC_OR_DEPARTMENT_OTHER): Payer: Self-pay | Admitting: *Deleted

## 2024-01-22 MED ORDER — METFORMIN HCL ER 500 MG PO TB24: 500.0000 mg | ORAL_TABLET | Freq: Every day | ORAL | 3 refills | Status: AC

## 2024-01-22 MED ORDER — LOSARTAN POTASSIUM 25 MG PO TABS: 25.0000 mg | ORAL_TABLET | Freq: Every day | ORAL | 3 refills | Status: DC

## 2024-01-23 ENCOUNTER — Ambulatory Visit: Payer: Self-pay

## 2024-01-23 NOTE — Telephone Encounter (Signed)
 FYI Only or Action Required?: Action required by provider: clinical question for provider and update on patient condition.  Patient was last seen in primary care on 01/03/2024 by Knute Thersia Bitters, FNP.  Called Nurse Triage reporting Hypertension.  Symptoms began several days ago.  Interventions attempted: Nothing.  Symptoms are: unchanged.  Triage Disposition: See PCP When Office is Open (Within 3 Days)  Patient/caregiver understands and will follow disposition?: No, wishes to speak with PCP  Copied from CRM [phone]. Topic: Clinical - Medication Question >> Jan 23, 2024  1:00 PM Cleave MATSU wrote: Reason for CRM: pt said the medication that alexis switched him to isn't working. Can she switch it to something else Reason for Disposition  [1] Taking BP medications AND [2] feels is having side effects (e.g., impotence, cough, dizzy upon standing)  Answer Assessment - Initial Assessment Questions Additional info: Patient medication was changed from lisinopril  to losartan , since he has noticed that has blood pressure is elevated each day in 130's/80's when it had been 110's/70's. He would like to know from PCP Thersia if he can go back on lisinopril  or if we could increase losartan  dose, his preference is to go back on lisinopril . Please advise.    1. BLOOD PRESSURE: What is your blood pressure? Did you take at least two measurements 5 minutes apart?     131/83 2. ONSET: When did you take your blood pressure?     today 3. HOW: How did you take your blood pressure? (e.g., automatic home BP monitor, visiting nurse)     Home cuff 4. HISTORY: Do you have a history of high blood pressure?     yes 5. MEDICINES: Are you taking any medicines for blood pressure? Have you missed any doses recently?     losartan  6. OTHER SYMPTOMS: Do you have any symptoms? (e.g., blurred vision, chest pain, difficulty breathing, headache, weakness)     Occasional lightheadedness when  standing  Protocols used: Blood Pressure - High-A-AH

## 2024-01-24 NOTE — Telephone Encounter (Signed)
 Tried calling pt but line went directly to [facility] sent pt a mychart message of info per  [place] about BP meds.

## 2024-01-26 ENCOUNTER — Ambulatory Visit: Admitting: Physical Therapy

## 2024-01-29 NOTE — Therapy (Signed)
 OUTPATIENT PHYSICAL THERAPY THORACOLUMBAR TREATMENT   Patient Name: Nicholas Cooke MRN: [id number] DOB:[birth year], 73 y.o., male Today's Date: 01/31/2024  END OF SESSION:  PT End of Session - 01/31/24 0725     Visit Number 3    Number of Visits 8    Date for PT Re-Evaluation 03/07/24    Authorization Type [employer] [facility]    PT Start Time [phone]    PT Stop Time 0803    PT Time Calculation (min) 45 min    Activity Tolerance Patient tolerated treatment well    Behavior During Therapy [facility] for tasks assessed/performed            Past Medical History:  Diagnosis Date   Chronic kidney disease    Dental crowns present    Depression    Enlarged prostate    Hypertension    states under control with med., has been on med. x 2 yr.   Inguinal hernia 12/2015   Osteoarthritis    Past Surgical History:  Procedure Laterality Date   APPENDECTOMY  yrs ago   CYSTOSCOPY WITH INSERTION OF UROLIFT N/A 07/03/2018   Procedure: CYSTOSCOPY WITH INSERTION OF UROLIFT;  Surgeon: Nieves Cough, MD;  Location: [facility];  Service: Urology;  Laterality: N/A;  ONLY NEEDS 30 MIN FOR PROCEDURE   HERNIA REPAIR  2014   INGUINAL HERNIA REPAIR  02/16/2012   Procedure: HERNIA REPAIR INGUINAL ADULT;  Surgeon: Vicenta DELENA Poli, MD;  Location: [facility];  Service: General;  Laterality: Left;  left inguinal hernia repair with mesh   INGUINAL HERNIA REPAIR Bilateral 12/23/2015   Procedure: LAPAROSCOPIC LEFT INGUINAL HERNIA WITH MESH;  Surgeon: Vicenta Poli, MD;  Location: [facility];  Service: General;  Laterality: Bilateral;   INSERTION OF MESH Bilateral 12/23/2015   Procedure: INSERTION OF MESH;  Surgeon: Vicenta Poli, MD;  Location: [facility];  Service: General;  Laterality: Bilateral;   JOINT REPLACEMENT     KNEE ARTHROSCOPY WITH MEDIAL MENISECTOMY Right 10/18/2013   Procedure: RIGHT KNEE ARTHROSCOPY WITH PARTIAL MEDIAL  MENISECTOMY;  Surgeon: Reyes JAYSON Billing, MD;  Location: [facility];  Service: Orthopedics;  Laterality: Right;   SHOULDER ARTHROSCOPY WITH ROTATOR CUFF REPAIR Left 2001   TOTAL KNEE ARTHROPLASTY Right 05/14/2015   Procedure: REMOVAL OF HARDWARE AND RIGHT TOTAL KNEE ARTHROPLASTY;  Surgeon: Reyes Billing, MD;  Location: [facility];  Service: Orthopedics;  Laterality: Right;   ULNAR NERVE REPAIR Right 1996   Patient Active Problem List   Diagnosis Date Noted   Lumbosacral spondylosis with radiculopathy 06/30/2023   Other chronic pain 06/30/2023   Lumbosacral spondylosis without myelopathy 06/30/2023   Lymphocytosis 06/27/2023   Pre-syncope 06/27/2023   Prediabetes 05/19/2023   Acute bilateral low back pain without sciatica 11/24/2022   Benign prostatic hyperplasia without lower urinary tract symptoms 05/17/2022   Stage 3b chronic kidney disease (HCC) 05/17/2022   Diverticulosis 05/17/2022   Hyperlipidemia 05/17/2022   Depression, recurrent (HCC) 05/17/2022   Essential hypertension 06/15/2020   Bilateral impacted cerumen 03/15/2018   Rhinitis 03/15/2018   Tinnitus aurium, bilateral 03/15/2018   Osteoarthritis of right knee 05/14/2015   Right knee DJD 05/14/2015   Hemiplegia affecting nondominant side (HCC) 06/13/2013   Left inguinal hernia 01/24/2012    PCP: Knute Thersia Bitters FNP  REFERRING PROVIDER: Garnell Harlene CROME, FNP  REFERRING DIAG: [phone] (ICD-10-CM) - Other spondylosis with radiculopathy, lumbosacral region  Rationale for Evaluation and Treatment: Rehabilitation  THERAPY DIAG:  Other  low back pain  Joint stiffness of both hips  Difficulty in walking, not elsewhere classified  ONSET DATE: chronic   SUBJECTIVE:                                                                                                                                                                                           SUBJECTIVE STATEMENT: Pt reports he over did his HEP, 3x daily, initially  which caused an increase in low back pain. He is now completing his HEP 1-2x daily which is helping him.  EVAL: Patient was referred from primary care provider.  Patient reports back pain extending into his LLE at times.  He has has difficulty mowing the lawn, standing, bending and tying shoes.  Described as sharp pain spreading out to both sides.  With standing, pain is in the post hips on fire,  pain goes to the ankle.  He feels like his neck is affected as well.  He has had shots for his cervical and lumbar spine. Patient has had PT before and it worsened the pain.  Patient would like to be able to live with this pain, does not currently stretch but reports walking 15 min around the block once a day. Walking does not increase his back pain in this amount of time but may with longer distances.     PERTINENT HISTORY:  Rt TKA L hip pain  HTN Kidney ds   PAIN:  Are you having pain? Yes: NPRS scale: 3/10 Pain location: low back  Pain description: aching  Aggravating factors: standing 15-20 min  Relieving factors: lying down, reclined position  PRECAUTIONS: None  RED FLAGS: None   WEIGHT BEARING RESTRICTIONS: No  FALLS:  Has patient fallen in last 6 months? Yes. Number of falls 1, fell down steps (slipper) and I fell in the kitchen x 2 , episodes of dehydration  Denies balance issues    LIVING ENVIRONMENT: Lives with: lives with their spouse Lives in: House/apartment Stairs: Yes: Internal: 12 steps; on right going up and External: a few  steps; on right going up Has following equipment at home: Single point cane  OCCUPATION: retred   PLOF: Independent, recreationally he is limited by pain (likes basketball)   PATIENT GOALS: I want to pain relief   NEXT MD VISIT: Tues. 01/15/24  OBJECTIVE:  Note: Objective measures were completed at Evaluation unless otherwise noted.  DIAGNOSTIC FINDINGS:  EXAM: LUMBAR SPINE - 3 VIEW   COMPARISON:  Lumbar spine radiograph dated  11/01/2019   FINDINGS: There is no evidence of lumbar spine fracture. Alignment is normal. Intervertebral disc spaces are maintained. Lower lumbar  facet arthropathy. Radiopaque surgical clips project over the midline pelvis.   IMPRESSION: Lower lumbar facet arthropathy. No acute fracture or traumatic listhesis.    CLINICAL DATA:  Right hip pain, no recent injury   EXAM: DG HIP (WITH OR WITHOUT PELVIS) 3-4V BILAT   COMPARISON:  None Available.   FINDINGS: Mild osteoarthritis changes in the hips with joint space narrowing and spurring, right slightly greater than left. SI joints symmetric. No acute bony abnormality. Specifically, no fracture, subluxation, or dislocation. Radiation seeds in the region of the prostate.   IMPRESSION: Mild osteoarthritis in the hips bilaterally, right slightly greater than left.   No acute bony abnormality.    PATIENT SURVEYS:  Modified Oswestry:  MODIFIED OSWESTRY DISABILITY SCALE  Date: 01/11/24 Score  Pain intensity 3 =  Pain medication provides me with moderate relief from pain.  2. Personal care (washing, dressing, etc.) 1 =  I can take care of myself normally, but it increases my pain.  3. Lifting 3 = Pain prevents me from lifting heavy weights, but I can manage (5) I have hardly any social life because of my pain. light to medium weights if they are conveniently positioned  4. Walking 2 =  Pain prevents me from walking more than  mile.  5. Sitting 2 =  Pain prevents me from sitting more than 1 hour.  6. Standing 3 =  Pain prevents me from standing more than 1/2 hour.  7. Sleeping 1 = I can sleep well only by using pain medication.  8. Social Life 4 =  Pain has restricted my social life to my home  9. Traveling 2 =  My pain restricts my travel over 2 hours.  10. Employment/ Homemaking 2 = I can perform most of my homemaking/job duties, but pain prevents me from performing more physically stressful activities (eg, lifting, vacuuming).   Total 23/50   Interpretation of scores: Score Category Description  0-20% Minimal Disability The patient can cope with most living activities. Usually no treatment is indicated apart from advice on lifting, sitting and exercise  21-40% Moderate Disability The patient experiences more pain and difficulty with sitting, lifting and standing. Travel and social life are more difficult and they may be disabled from work. Personal care, sexual activity and sleeping are not grossly affected, and the patient can usually be managed by conservative means  41-60% Severe Disability Pain remains the main problem in this group, but activities of daily living are affected. These patients require a detailed investigation  61-80% Crippled Back pain impinges on all aspects of the patient's life. Positive intervention is required  81-100% Bed-bound  These patients are either bed-bound or exaggerating their symptoms  Bluford FORBES Zoe DELENA Karon DELENA, et al. Surgery versus conservative management of stable thoracolumbar fracture: the PRESTO feasibility RCT. [place]): [employer]; 2021 Nov. [facility] Technology Assessment, No. 25.62.) Appendix 3, Oswestry Disability Index category descriptors. Available from: Find[employer]el[employer]cz  Minimally Clinically Important Difference (MCID) = 12.8%  COGNITION: Overall cognitive status: Within functional limits for tasks assessed     SENSATION: LLE tingling and numbness intermittent   MUSCLE LENGTH: Hamstrings: Rt tight, passive SLR to 45 deg, Lt. Passive SLR to 35 deg with back pain  Thomas test: NT but very tight   POSTURE: rounded shoulders, forward head, and loss of Lumbar curve   PALPATION: Pain along bilateral low lumbar spine and across to both sides into bilateral hips , glutes    LUMBAR ROM:   AROM  eval  Flexion Hands to knees with knees unlocked   Extension Feels good   Right lateral flexion Min   Left lateral flexion  Min   Right rotation 75%  Left rotation 75%   (Blank rows = not tested)  LOWER EXTREMITY ROM:   PROM limited in hip extension, abduction and ER/IR not measured    LOWER EXTREMITY MMT:    MMT Right eval Left eval  Hip flexion 4 4  Hip extension 3 3  Hip abduction 3+ 3+  Hip adduction    Hip internal rotation    Hip external rotation    Knee flexion 5 5  Knee extension 5 5  Ankle dorsiflexion 5 5  Ankle plantarflexion    Ankle inversion    Ankle eversion     (Blank rows = not tested)  LUMBAR SPECIAL TESTS:  Straight leg raise test: PositiveLLE  FUNCTIONAL TESTS:  NT on eval   GAIT: Distance walked: 100  Assistive device utilized: None Level of assistance: Modified independence Comments: slow pace , min limp Rt knee   TREATMENT DATE:  [facility] Adult PT Treatment:                                                DATE: 01/31/24 Therapeutic Exercise: Supine Lower Trunk Rotation x5 5 Hooklying Single Knee to Chest Stretch  x3 20 Supine Hamstring Stretch  x3 20 Therapeutic Activity: Supine Bridge x10 3 Supine Transversus Abdominis Bracing x10 3 Supine Ab bracing 90/90 x5 10 Supine hip clams BluTB x12 3 Sit to Stand with hinged hip x10 Shoulder Extension with Resistance  x15 BluTB Paloff press x10 each BluTB Updated HEP  [facility] Adult PT Treatment:                                                DATE: 01/19/24 Therapeutic Exercise: Supine Lower Trunk Rotation x5 10 Hooklying Single Knee to Chest Stretch  x3 20 Supine Hamstring Stretch  x3 20 Supine Bridge x10 3 Supine Transversus Abdominis Bracing x10 3 Supine hip clams BluTB x12 3 Sit to Stand with Arm Reach Toward Target  x10 Shoulder Extension with Resistance  2x10 GTB Updated HEP  [facility] Adult PT Treatment:                                                DATE: 01/11/24 Self Care: HEP, explanation of rationale of the exercises and POC, hurt not harm and importance of daily mobility routine for long term  benefit, health.        Body mechanics and hip hinge to save back and knees  PATIENT EDUCATION:  Education details: see above self care  Person educated: Patient Education method: Explanation, Demonstration, Verbal cues, and Handouts Education comprehension: verbalized understanding and needs further education  HOME EXERCISE PROGRAM: Access Code: [email or web address] URL: [email or web address] Date: 01/31/2024 Prepared by: Dasie Daft  Exercises - Supine Lower Trunk Rotation  - 1-2 x daily - 7 x weekly - 1-2 sets - 10 reps - 10 hold - Hooklying Single Knee to Chest Stretch  - 1-2 x daily - 7 x weekly - 1-2 sets - 3 reps - 30 hold - Supine Hamstring Stretch  - 1-2 x daily - 7 x weekly - 1-2 sets - 5-10 reps - 10 hold - Sit to Stand with Arm Reach Toward Target  - 1 x daily - 7 x weekly - 1-2 sets - 10 reps - 5 hold - Supine Bridge  - 1 x daily - 7 x weekly - 1-2 sets - 10 reps - 3 hold - Supine Transversus Abdominis Bracing - Hands on Ground  - 1 x daily - 7 x weekly - 1-2 sets - 10 reps - 3 hold - Supine 90/90 Abdominal Bracing  - 1 x daily - 7 x weekly - 1-2 sets - 5 reps - 10 hold - Shoulder Extension with Resistance  - 1 x daily - 7 x weekly - 1-2 sets - 10 reps - 3 hold - Standing Anti-Rotation Press with Anchored Resistance  - 1 x daily - 7 x weekly - 1-2 sets - 10 reps - 3 hold  ASSESSMENT:  CLINICAL IMPRESSION: PT was completed for lumbopelvic flexibility and strengthening with progressions with core strengthening. HEP was updated. Verbal cueing was provided for most proper technique. Pt tolerated the prescribed exs without adverse effects. Pt will continue to benefit from skilled PT to address impairments for improved back function with minimized pain.  EVAL: Patient is a 73 y.o. male who was seen today for physical therapy evaluation and treatment for low back pain with  radiating pain into L>R LE. He has previously had PT which gave him no benefit and so he is understandably skeptical. Discussed his ONE takeaway being a proper hip hinge to reduce back pain with functional mobility   OBJECTIVE IMPAIRMENTS: decreased activity tolerance, decreased balance, decreased coordination, decreased endurance, decreased knowledge of condition, decreased mobility, difficulty walking, decreased ROM, decreased strength, hypomobility, increased fascial restrictions, impaired flexibility, impaired sensation, improper body mechanics, postural dysfunction, and pain.   ACTIVITY LIMITATIONS: carrying, lifting, bending, sitting, standing, squatting, sleeping, stairs, bed mobility, and locomotion level  PARTICIPATION LIMITATIONS: cleaning, interpersonal relationship, driving, shopping, community activity, and yard work  PERSONAL FACTORS: Age, Past/current experiences, Time since onset of injury/illness/exacerbation, and 3+ comorbidities: chronic back pain, neck pain, Rt knee and L hip  are also affecting patient's functional outcome.   REHAB POTENTIAL: Fair due to chronicity and previous experience of PT   CLINICAL DECISION MAKING: Stable/uncomplicated  EVALUATION COMPLEXITY: Low   GOALS: Goals reviewed with patient? Yes  SHORT TERM GOALS: Target date: 02/08/2024  Patient will be able to show independence for initial HEP to include posture, core and hip strength and stability.   Baseline: Goal status: ONGOING  2.  Pt will complete 2 min walk test and set goal  Baseline:  Goal status: INITIAL  3.  Pt will begin to understand proper hip hinge for ADLs and in clinic exercises  Baseline:  Goal status: ONGOING  LONG TERM GOALS: Target date: 03/07/2024    Patient will be independent with  final HEP upon discharge from PT and report consistent benefit following exercise completion.    Baseline: unknown Goal status: INITIAL  2.  Patient will be able to stand/walk for 30 min  with no more than min increasing pain in back, legs  Baseline: 15 min  Goal status: INITIAL  3.  Patient will be able to improve 2 min walk test distance by 50 feet or more with LRAD and no increase in pain.   Baseline:  Goal status: INITIAL  4.  Patient will be able to demonstrate proper posture and lifting techniques related to spine health and reduction of symptoms.  Baseline:  Goal status: INITIAL  5.  Pt will report 25% improvement in LE symptoms (radicular) to demo improved neural tension  Baseline:  Goal status: INITIAL  PLAN:  PT FREQUENCY: 1-2x/week  PT DURATION: 8 weeks  PLANNED INTERVENTIONS: 97164- PT Re-evaluation, 97750- Physical Performance Testing, 97110-Therapeutic exercises, 97530- Therapeutic activity, [id number]- Neuromuscular re-education, 97535- Self Care, [id number]- Manual therapy, [id number]- Gait training, [phone]- Aquatic Therapy, [phone] (1-2 muscles), [id number] (3+ muscles)- Dry Needling, Patient/Family education, Balance training, Joint mobilization, Spinal mobilization, Cryotherapy, and Moist heat.  PLAN FOR NEXT SESSION: check HEP, NuStep   Lorissa Kishbaugh MS, PT 01/31/24 8:25 AM

## 2024-01-31 ENCOUNTER — Ambulatory Visit

## 2024-01-31 DIAGNOSIS — R262 Difficulty in walking, not elsewhere classified: Secondary | ICD-10-CM | POA: Diagnosis not present

## 2024-01-31 DIAGNOSIS — M25652 Stiffness of left hip, not elsewhere classified: Secondary | ICD-10-CM | POA: Diagnosis not present

## 2024-01-31 DIAGNOSIS — M5459 Other low back pain: Secondary | ICD-10-CM | POA: Diagnosis not present

## 2024-01-31 DIAGNOSIS — M25651 Stiffness of right hip, not elsewhere classified: Secondary | ICD-10-CM

## 2024-02-06 NOTE — Telephone Encounter (Signed)
 Pt finally responded to mychart message about BP meds. Routing to [employer].

## 2024-02-06 NOTE — Therapy (Signed)
 OUTPATIENT PHYSICAL THERAPY THORACOLUMBAR TREATMENT   Patient Name: Nicholas Cooke MRN: [id number] DOB:Dec 05, 1950, 73 y.o., male Today's Date: 02/07/2024  END OF SESSION:  PT End of Session - 02/07/24 0737     Visit Number 4    Number of Visits 8    Date for Recertification  03/07/24    Authorization Type [employer] [facility]    PT Start Time [phone]    PT Stop Time 0800    PT Time Calculation (min) 44 min    Activity Tolerance Patient tolerated treatment well    Behavior During Therapy [facility] for tasks assessed/performed             Past Medical History:  Diagnosis Date   Chronic kidney disease    Dental crowns present    Depression    Enlarged prostate    Hypertension    states under control with med., has been on med. x 2 yr.   Inguinal hernia 12/2015   Osteoarthritis    Past Surgical History:  Procedure Laterality Date   APPENDECTOMY  yrs ago   CYSTOSCOPY WITH INSERTION OF UROLIFT N/A 07/03/2018   Procedure: CYSTOSCOPY WITH INSERTION OF UROLIFT;  Surgeon: Nieves Cough, MD;  Location: [facility];  Service: Urology;  Laterality: N/A;  ONLY NEEDS 30 MIN FOR PROCEDURE   HERNIA REPAIR  2014   INGUINAL HERNIA REPAIR  02/16/2012   Procedure: HERNIA REPAIR INGUINAL ADULT;  Surgeon: Vicenta DELENA Poli, MD;  Location: [facility];  Service: General;  Laterality: Left;  left inguinal hernia repair with mesh   INGUINAL HERNIA REPAIR Bilateral 12/23/2015   Procedure: LAPAROSCOPIC LEFT INGUINAL HERNIA WITH MESH;  Surgeon: Vicenta Poli, MD;  Location: [facility];  Service: General;  Laterality: Bilateral;   INSERTION OF MESH Bilateral 12/23/2015   Procedure: INSERTION OF MESH;  Surgeon: Vicenta Poli, MD;  Location: [facility];  Service: General;  Laterality: Bilateral;   JOINT REPLACEMENT     KNEE ARTHROSCOPY WITH MEDIAL MENISECTOMY Right 10/18/2013   Procedure: RIGHT KNEE ARTHROSCOPY WITH PARTIAL MEDIAL  MENISECTOMY;  Surgeon: Reyes JAYSON Billing, MD;  Location: [facility];  Service: Orthopedics;  Laterality: Right;   SHOULDER ARTHROSCOPY WITH ROTATOR CUFF REPAIR Left 2001   TOTAL KNEE ARTHROPLASTY Right 05/14/2015   Procedure: REMOVAL OF HARDWARE AND RIGHT TOTAL KNEE ARTHROPLASTY;  Surgeon: Reyes Billing, MD;  Location: [facility];  Service: Orthopedics;  Laterality: Right;   ULNAR NERVE REPAIR Right 1996   Patient Active Problem List   Diagnosis Date Noted   Lumbosacral spondylosis with radiculopathy 06/30/2023   Other chronic pain 06/30/2023   Lumbosacral spondylosis without myelopathy 06/30/2023   Lymphocytosis 06/27/2023   Pre-syncope 06/27/2023   Prediabetes 05/19/2023   Acute bilateral low back pain without sciatica 11/24/2022   Benign prostatic hyperplasia without lower urinary tract symptoms 05/17/2022   Stage 3b chronic kidney disease (HCC) 05/17/2022   Diverticulosis 05/17/2022   Hyperlipidemia 05/17/2022   Depression, recurrent 05/17/2022   Essential hypertension 06/15/2020   Bilateral impacted cerumen 03/15/2018   Rhinitis 03/15/2018   Tinnitus aurium, bilateral 03/15/2018   Osteoarthritis of right knee 05/14/2015   Right knee DJD 05/14/2015   Hemiplegia affecting nondominant side (HCC) 06/13/2013   Left inguinal hernia 01/24/2012    PCP: Knute Thersia Bitters FNP  REFERRING PROVIDER: Garnell Harlene CROME, FNP  REFERRING DIAG: [phone] (ICD-10-CM) - Other spondylosis with radiculopathy, lumbosacral region  Rationale for Evaluation and Treatment: Rehabilitation  THERAPY DIAG:  Other  low back pain  Joint stiffness of both hips  Difficulty in walking, not elsewhere classified  ONSET DATE: chronic   SUBJECTIVE:                                                                                                                                                                                           SUBJECTIVE STATEMENT: Pt reports overall his low back is feeling better. His back  bothers him the most with prolonged standing  EVAL: Patient was referred from primary care provider.  Patient reports back pain extending into his LLE at times.  He has has difficulty mowing the lawn, standing, bending and tying shoes.  Described as sharp pain spreading out to both sides.  With standing, pain is in the post hips on fire,  pain goes to the ankle.  He feels like his neck is affected as well.  He has had shots for his cervical and lumbar spine. Patient has had PT before and it worsened the pain.  Patient would like to be able to live with this pain, does not currently stretch but reports walking 15 min around the block once a day. Walking does not increase his back pain in this amount of time but may with longer distances.     PERTINENT HISTORY:  Rt TKA L hip pain  HTN Kidney ds   PAIN:  Are you having pain? Yes: NPRS scale: 3/10 Pain location: low back  Pain description: aching  Aggravating factors: standing 15-20 min  Relieving factors: lying down, reclined position  PRECAUTIONS: None  RED FLAGS: None   WEIGHT BEARING RESTRICTIONS: No  FALLS:  Has patient fallen in last 6 months? Yes. Number of falls 1, fell down steps (slipper) and I fell in the kitchen x 2 , episodes of dehydration  Denies balance issues    LIVING ENVIRONMENT: Lives with: lives with their spouse Lives in: House/apartment Stairs: Yes: Internal: 12 steps; on right going up and External: a few  steps; on right going up Has following equipment at home: Single point cane  OCCUPATION: retred   PLOF: Independent, recreationally he is limited by pain (likes basketball)   PATIENT GOALS: I want to pain relief   NEXT MD VISIT: Tues. 01/15/24  OBJECTIVE:  Note: Objective measures were completed at Evaluation unless otherwise noted.  DIAGNOSTIC FINDINGS:  EXAM: LUMBAR SPINE - 3 VIEW   COMPARISON:  Lumbar spine radiograph dated 11/01/2019   FINDINGS: There is no evidence of lumbar spine  fracture. Alignment is normal. Intervertebral disc spaces are maintained. Lower lumbar facet arthropathy. Radiopaque surgical clips project over the midline pelvis.  IMPRESSION: Lower lumbar facet arthropathy. No acute fracture or traumatic listhesis.    CLINICAL DATA:  Right hip pain, no recent injury   EXAM: DG HIP (WITH OR WITHOUT PELVIS) 3-4V BILAT   COMPARISON:  None Available.   FINDINGS: Mild osteoarthritis changes in the hips with joint space narrowing and spurring, right slightly greater than left. SI joints symmetric. No acute bony abnormality. Specifically, no fracture, subluxation, or dislocation. Radiation seeds in the region of the prostate.   IMPRESSION: Mild osteoarthritis in the hips bilaterally, right slightly greater than left.   No acute bony abnormality.    PATIENT SURVEYS:  Modified Oswestry:  MODIFIED OSWESTRY DISABILITY SCALE  Date: 01/11/24 Score  Pain intensity 3 =  Pain medication provides me with moderate relief from pain.  2. Personal care (washing, dressing, etc.) 1 =  I can take care of myself normally, but it increases my pain.  3. Lifting 3 = Pain prevents me from lifting heavy weights, but I can manage (5) I have hardly any social life because of my pain. light to medium weights if they are conveniently positioned  4. Walking 2 =  Pain prevents me from walking more than  mile.  5. Sitting 2 =  Pain prevents me from sitting more than 1 hour.  6. Standing 3 =  Pain prevents me from standing more than 1/2 hour.  7. Sleeping 1 = I can sleep well only by using pain medication.  8. Social Life 4 =  Pain has restricted my social life to my home  9. Traveling 2 =  My pain restricts my travel over 2 hours.  10. Employment/ Homemaking 2 = I can perform most of my homemaking/job duties, but pain prevents me from performing more physically stressful activities (eg, lifting, vacuuming).  Total 23/50   Interpretation of scores: Score Category  Description  0-20% Minimal Disability The patient can cope with most living activities. Usually no treatment is indicated apart from advice on lifting, sitting and exercise  21-40% Moderate Disability The patient experiences more pain and difficulty with sitting, lifting and standing. Travel and social life are more difficult and they may be disabled from work. Personal care, sexual activity and sleeping are not grossly affected, and the patient can usually be managed by conservative means  41-60% Severe Disability Pain remains the main problem in this group, but activities of daily living are affected. These patients require a detailed investigation  61-80% Crippled Back pain impinges on all aspects of the patient's life. Positive intervention is required  81-100% Bed-bound  These patients are either bed-bound or exaggerating their symptoms  Bluford FORBES Zoe DELENA Karon DELENA, et al. Surgery versus conservative management of stable thoracolumbar fracture: the PRESTO feasibility RCT. [place]): [employer]; 2021 Nov. [facility] Technology Assessment, No. 25.62.) Appendix 3, Oswestry Disability Index category descriptors. Available from: FindJewel[employer].cz  Minimally Clinically Important Difference (MCID) = 12.8%  COGNITION: Overall cognitive status: Within functional limits for tasks assessed     SENSATION: LLE tingling and numbness intermittent   MUSCLE LENGTH: Hamstrings: Rt tight, passive SLR to 45 deg, Lt. Passive SLR to 35 deg with back pain  Thomas test: NT but very tight   POSTURE: rounded shoulders, forward head, and loss of Lumbar curve   PALPATION: Pain along bilateral low lumbar spine and across to both sides into bilateral hips , glutes    LUMBAR ROM:   AROM eval  Flexion Hands to knees with knees unlocked   Extension  Feels good   Right lateral flexion Min   Left lateral flexion Min   Right rotation 75%  Left rotation 75%   (Blank  rows = not tested)  LOWER EXTREMITY ROM:   PROM limited in hip extension, abduction and ER/IR not measured    LOWER EXTREMITY MMT:    MMT Right eval Left eval  Hip flexion 4 4  Hip extension 3 3  Hip abduction 3+ 3+  Hip adduction    Hip internal rotation    Hip external rotation    Knee flexion 5 5  Knee extension 5 5  Ankle dorsiflexion 5 5  Ankle plantarflexion    Ankle inversion    Ankle eversion     (Blank rows = not tested)  LUMBAR SPECIAL TESTS:  Straight leg raise test: PositiveLLE  FUNCTIONAL TESTS:  NT on eval   GAIT: Distance walked: 100  Assistive device utilized: None Level of assistance: Modified independence Comments: slow pace , min limp Rt knee   TREATMENT DATE:  [facility] Adult PT Treatment:                                                DATE: 02/07/24 Therapeutic Exercise: Seated trunk flexion fwd and laterally Supine Lower Trunk Rotation x5 5 Therapeutic Activity: Nustep 8 min L5 LE/UE STS x10 c hinged hip, then 15# x10 Hinged hip dead lift 2x10 15#, verbal cueing for proper technique Shoulder rows 2x10 13# Shoulder ext 2x10 13# Paloff press x10 each BluTB Self care: Provided information for potential cause of increased low back pain c prolonged standing and the purpose of PT intervention  [facility] Adult PT Treatment:                                                DATE: 01/31/24 Therapeutic Exercise: Supine Lower Trunk Rotation x5 5 Hooklying Single Knee to Chest Stretch  x3 20 Supine Hamstring Stretch  x3 20 Therapeutic Activity: Supine Bridge x10 3 Supine Transversus Abdominis Bracing x10 3 Supine Ab bracing 90/90 x5 10 Supine hip clams BluTB x12 3 Sit to Stand with hinged hip x10 Shoulder Extension with Resistance  x15 BluTB Paloff press x10 each BluTB Updated HEP  [facility] Adult PT Treatment:                                                DATE: 01/19/24 Therapeutic Exercise: Supine Lower Trunk Rotation x5 10 Hooklying Single Knee to  Chest Stretch  x3 20 Supine Hamstring Stretch  x3 20 Supine Bridge x10 3 Supine Transversus Abdominis Bracing x10 3 Supine hip clams BluTB x12 3 Sit to Stand with Arm Reach Toward Target  x10 Shoulder Extension with Resistance  2x10 GTB Updated HEP                                                           PATIENT EDUCATION:  Education  details: see above self care  Person educated: Patient Education method: Explanation, Demonstration, Verbal cues, and Handouts Education comprehension: verbalized understanding and needs further education  HOME EXERCISE PROGRAM: Access Code: [email or web address] URL: [email or web address] Date: 02/07/2024 Prepared by: Dasie Daft  Exercises - Supine Lower Trunk Rotation  - 1-2 x daily - 7 x weekly - 1-2 sets - 10 reps - 10 hold - Hooklying Single Knee to Chest Stretch  - 1-2 x daily - 7 x weekly - 1-2 sets - 3 reps - 30 hold - Supine Hamstring Stretch  - 1-2 x daily - 7 x weekly - 1-2 sets - 5-10 reps - 10 hold - Supine Bridge  - 1 x daily - 7 x weekly - 1-2 sets - 10 reps - 3 hold - Supine Transversus Abdominis Bracing - Hands on Ground  - 1 x daily - 7 x weekly - 1-2 sets - 10 reps - 3 hold - Supine 90/90 Abdominal Bracing  - 1 x daily - 7 x weekly - 1-2 sets - 5 reps - 10 hold - Shoulder Extension with Resistance  - 1 x daily - 7 x weekly - 1-2 sets - 10 reps - 3 hold - Standing Anti-Rotation Press with Anchored Resistance  - 1 x daily - 7 x weekly - 1-2 sets - 10 reps - 3 hold - Sit to Stand Without Arm Support  - 1 x daily - 7 x weekly - 2 sets - 10 reps - 3 hold - Seated Flexion Stretch with Swiss Ball  - 1 x daily - 7 x weekly - 1 sets - 10-20 reps - 5-20 hold  ASSESSMENT:  CLINICAL IMPRESSION: PT was continued for lumbopelvic flexibility and strengthening. Pt participated in more exs for back strengthening involving lifting c proper technique. Pt needs continued Ed and work on proper technique for dead lifting. Pt tolerated prescribed  exs in PT today without adverse effects. Pt is responding positively to PT.   EVAL: Patient is a 73 y.o. male who was seen today for physical therapy evaluation and treatment for low back pain with radiating pain into L>R LE. He has previously had PT which gave him no benefit and so he is understandably skeptical. Discussed his ONE takeaway being a proper hip hinge to reduce back pain with functional mobility   OBJECTIVE IMPAIRMENTS: decreased activity tolerance, decreased balance, decreased coordination, decreased endurance, decreased knowledge of condition, decreased mobility, difficulty walking, decreased ROM, decreased strength, hypomobility, increased fascial restrictions, impaired flexibility, impaired sensation, improper body mechanics, postural dysfunction, and pain.   ACTIVITY LIMITATIONS: carrying, lifting, bending, sitting, standing, squatting, sleeping, stairs, bed mobility, and locomotion level  PARTICIPATION LIMITATIONS: cleaning, interpersonal relationship, driving, shopping, community activity, and yard work  PERSONAL FACTORS: Age, Past/current experiences, Time since onset of injury/illness/exacerbation, and 3+ comorbidities: chronic back pain, neck pain, Rt knee and L hip  are also affecting patient's functional outcome.   REHAB POTENTIAL: Fair due to chronicity and previous experience of PT   CLINICAL DECISION MAKING: Stable/uncomplicated  EVALUATION COMPLEXITY: Low   GOALS: Goals reviewed with patient? Yes  SHORT TERM GOALS: Target date: 02/08/2024  Patient will be able to show independence for initial HEP to include posture, core and hip strength and stability.   Baseline: Goal status: MET 02/07/24  2.  Pt will complete 2 min walk test and set goal  Baseline:  Goal status: INITIAL  3.  Pt will begin to understand proper hip hinge for ADLs and in clinic exercises  Baseline:  Goal status: IMPROVING  LONG TERM GOALS: Target date: 03/07/2024    Patient will be  independent with final HEP upon discharge from PT and report consistent benefit following exercise completion.    Baseline: unknown Goal status: INITIAL  2.  Patient will be able to stand/walk for 30 min with no more than min increasing pain in back, legs  Baseline: 15 min  Goal status: INITIAL  3.  Patient will be able to improve 2 min walk test distance by 50 feet or more with LRAD and no increase in pain.   Baseline:  Goal status: INITIAL  4.  Patient will be able to demonstrate proper posture and lifting techniques related to spine health and reduction of symptoms.  Baseline:  Goal status: INITIAL  5.  Pt will report 25% improvement in LE symptoms (radicular) to demo improved neural tension  Baseline:  Goal status: INITIAL  PLAN:  PT FREQUENCY: 1-2x/week  PT DURATION: 8 weeks  PLANNED INTERVENTIONS: 97164- PT Re-evaluation, 97750- Physical Performance Testing, 97110-Therapeutic exercises, 97530- Therapeutic activity, [id number]- Neuromuscular re-education, 97535- Self Care, [id number]- Manual therapy, [id number]- Gait training, [phone]- Aquatic Therapy, [phone] (1-2 muscles), 20561 (3+ muscles)- Dry Needling, Patient/Family education, Balance training, Joint mobilization, Spinal mobilization, Cryotherapy, and Moist heat.  PLAN FOR NEXT SESSION: check HEP, NuStep   Lott Seelbach MS, PT 02/07/24 8:27 AM

## 2024-02-07 ENCOUNTER — Other Ambulatory Visit (HOSPITAL_BASED_OUTPATIENT_CLINIC_OR_DEPARTMENT_OTHER): Payer: Self-pay | Admitting: Family Medicine

## 2024-02-07 ENCOUNTER — Ambulatory Visit

## 2024-02-07 DIAGNOSIS — M25652 Stiffness of left hip, not elsewhere classified: Secondary | ICD-10-CM | POA: Diagnosis not present

## 2024-02-07 DIAGNOSIS — R262 Difficulty in walking, not elsewhere classified: Secondary | ICD-10-CM

## 2024-02-07 DIAGNOSIS — M25651 Stiffness of right hip, not elsewhere classified: Secondary | ICD-10-CM | POA: Diagnosis not present

## 2024-02-07 DIAGNOSIS — M5459 Other low back pain: Secondary | ICD-10-CM | POA: Diagnosis not present

## 2024-02-07 MED ORDER — LOSARTAN POTASSIUM 25 MG PO TABS: 37.5000 mg | ORAL_TABLET | Freq: Every day | ORAL | 3 refills | Status: DC

## 2024-02-14 DIAGNOSIS — M25552 Pain in left hip: Secondary | ICD-10-CM | POA: Diagnosis not present

## 2024-02-23 ENCOUNTER — Other Ambulatory Visit (HOSPITAL_BASED_OUTPATIENT_CLINIC_OR_DEPARTMENT_OTHER): Payer: Self-pay | Admitting: Family Medicine

## 2024-02-23 NOTE — Telephone Encounter (Signed)
 Copied from CRM [id number]. Topic: Clinical - Medication Refill >> Feb 23, 2024  5:38 PM Sophia H wrote: Medication: losartan  (COZAAR ) 25 MG tablet **Patient has been out for 3 days now, waiting on meds to come via mail order but patient is requesting a bridge to get him to that. States was told 1-2 weeks delivery time.   Has the patient contacted their pharmacy? Yes.  This is the patient's preferred pharmacy:   [facility] [id number] - [place], [place] - [address] [place] [address] Phone: [phone] Fax: [phone]  Is this the correct pharmacy for this prescription? Yes If no, delete pharmacy and type the correct one.   Has the prescription been filled recently? Yes  Is the patient out of the medication? Yes  Has the patient been seen for an appointment in the last year OR does the patient have an upcoming appointment? Yes, seen Aug 20th.  Can we respond through MyChart? Yes  Agent: Please be advised that Rx refills may take up to 3 business days. We ask that you follow-up with your pharmacy.

## 2024-02-23 NOTE — Telephone Encounter (Signed)
 losartan  (COZAAR ) 25 MG tablet **Patient has been out for 3 days now, waiting on meds to come via mail order but patient is requesting a bridge to get him to that. States was told 1-2 weeks delivery time.

## 2024-02-26 MED ORDER — LOSARTAN POTASSIUM 25 MG PO TABS: 37.5000 mg | ORAL_TABLET | Freq: Every day | ORAL | 3 refills | Status: DC

## 2024-02-28 ENCOUNTER — Other Ambulatory Visit: Payer: Self-pay

## 2024-02-28 ENCOUNTER — Inpatient Hospital Stay (HOSPITAL_BASED_OUTPATIENT_CLINIC_OR_DEPARTMENT_OTHER)
Admission: EM | Admit: 2024-02-28 | Discharge: 2024-03-02 | DRG: 872 | Disposition: A | Discharge status: Home / Self Care | Attending: Internal Medicine | Admitting: Internal Medicine

## 2024-02-28 ENCOUNTER — Encounter (HOSPITAL_BASED_OUTPATIENT_CLINIC_OR_DEPARTMENT_OTHER): Payer: Self-pay | Admitting: Emergency Medicine

## 2024-02-28 ENCOUNTER — Emergency Department (HOSPITAL_BASED_OUTPATIENT_CLINIC_OR_DEPARTMENT_OTHER)

## 2024-02-28 DIAGNOSIS — M4727 Other spondylosis with radiculopathy, lumbosacral region: Secondary | ICD-10-CM | POA: Diagnosis present

## 2024-02-28 DIAGNOSIS — N400 Enlarged prostate without lower urinary tract symptoms: Secondary | ICD-10-CM | POA: Diagnosis present

## 2024-02-28 DIAGNOSIS — Z9181 History of falling: Secondary | ICD-10-CM

## 2024-02-28 DIAGNOSIS — Z7982 Long term (current) use of aspirin: Secondary | ICD-10-CM

## 2024-02-28 DIAGNOSIS — Z8261 Family history of arthritis: Secondary | ICD-10-CM

## 2024-02-28 DIAGNOSIS — R0682 Tachypnea, not elsewhere classified: Secondary | ICD-10-CM | POA: Diagnosis present

## 2024-02-28 DIAGNOSIS — K5790 Diverticulosis of intestine, part unspecified, without perforation or abscess without bleeding: Secondary | ICD-10-CM | POA: Diagnosis present

## 2024-02-28 DIAGNOSIS — Z79899 Other long term (current) drug therapy: Secondary | ICD-10-CM

## 2024-02-28 DIAGNOSIS — G8194 Hemiplegia, unspecified affecting left nondominant side: Secondary | ICD-10-CM | POA: Diagnosis present

## 2024-02-28 DIAGNOSIS — R6520 Severe sepsis without septic shock: Secondary | ICD-10-CM | POA: Diagnosis present

## 2024-02-28 DIAGNOSIS — A419 Sepsis, unspecified organism: Secondary | ICD-10-CM | POA: Diagnosis not present

## 2024-02-28 DIAGNOSIS — N1832 Chronic kidney disease, stage 3b: Secondary | ICD-10-CM | POA: Diagnosis present

## 2024-02-28 DIAGNOSIS — I959 Hypotension, unspecified: Secondary | ICD-10-CM | POA: Diagnosis present

## 2024-02-28 DIAGNOSIS — G8190 Hemiplegia, unspecified affecting unspecified side: Secondary | ICD-10-CM

## 2024-02-28 DIAGNOSIS — N179 Acute kidney failure, unspecified: Secondary | ICD-10-CM | POA: Diagnosis not present

## 2024-02-28 DIAGNOSIS — Z91148 Patient's other noncompliance with medication regimen for other reason: Secondary | ICD-10-CM

## 2024-02-28 DIAGNOSIS — Z823 Family history of stroke: Secondary | ICD-10-CM

## 2024-02-28 DIAGNOSIS — I129 Hypertensive chronic kidney disease with stage 1 through stage 4 chronic kidney disease, or unspecified chronic kidney disease: Secondary | ICD-10-CM | POA: Diagnosis present

## 2024-02-28 DIAGNOSIS — E872 Acidosis, unspecified: Secondary | ICD-10-CM | POA: Diagnosis present

## 2024-02-28 DIAGNOSIS — R0602 Shortness of breath: Secondary | ICD-10-CM | POA: Diagnosis not present

## 2024-02-28 DIAGNOSIS — E871 Hypo-osmolality and hyponatremia: Secondary | ICD-10-CM | POA: Diagnosis present

## 2024-02-28 DIAGNOSIS — I10 Essential (primary) hypertension: Secondary | ICD-10-CM | POA: Diagnosis present

## 2024-02-28 DIAGNOSIS — Z96651 Presence of right artificial knee joint: Secondary | ICD-10-CM | POA: Diagnosis present

## 2024-02-28 DIAGNOSIS — E785 Hyperlipidemia, unspecified: Secondary | ICD-10-CM | POA: Diagnosis present

## 2024-02-28 DIAGNOSIS — F339 Major depressive disorder, recurrent, unspecified: Secondary | ICD-10-CM | POA: Diagnosis present

## 2024-02-28 DIAGNOSIS — K5732 Diverticulitis of large intestine without perforation or abscess without bleeding: Secondary | ICD-10-CM | POA: Diagnosis present

## 2024-02-28 DIAGNOSIS — Z7984 Long term (current) use of oral hypoglycemic drugs: Secondary | ICD-10-CM

## 2024-02-28 DIAGNOSIS — R7303 Prediabetes: Secondary | ICD-10-CM | POA: Diagnosis present

## 2024-02-28 DIAGNOSIS — K5792 Diverticulitis of intestine, part unspecified, without perforation or abscess without bleeding: Secondary | ICD-10-CM | POA: Diagnosis present

## 2024-02-28 LAB — CBC
HCT: 46.7 % (ref 39.0–52.0)
Hemoglobin: 15.8 g/dL (ref 13.0–17.0)
MCH: 29.3 pg (ref 26.0–34.0)
MCHC: 33.8 g/dL (ref 30.0–36.0)
MCV: 86.5 fL (ref 80.0–100.0)
Platelets: 212 K/uL (ref 150–400)
RBC: 5.4 MIL/uL (ref 4.22–5.81)
RDW: 13.3 % (ref 11.5–15.5)
WBC: 14.2 K/uL — ABNORMAL HIGH (ref 4.0–10.5)
nRBC: 0 % (ref 0.0–0.2)

## 2024-02-28 LAB — BASIC METABOLIC PANEL WITH GFR
Anion gap: 16 — ABNORMAL HIGH (ref 5–15)
BUN: 24 mg/dL — ABNORMAL HIGH (ref 8–23)
CO2: 19 mmol/L — ABNORMAL LOW (ref 22–32)
Calcium: 9 mg/dL (ref 8.9–10.3)
Chloride: 97 mmol/L — ABNORMAL LOW (ref 98–111)
Creatinine, Ser: 2.79 mg/dL — ABNORMAL HIGH (ref 0.61–1.24)
GFR, Estimated: 24 mL/min — ABNORMAL LOW (ref >60)
Glucose, Bld: 120 mg/dL — ABNORMAL HIGH (ref 70–99)
Potassium: 3.7 mmol/L (ref 3.5–5.1)
Sodium: 132 mmol/L — ABNORMAL LOW (ref 135–145)

## 2024-02-28 LAB — HEPATIC FUNCTION PANEL
ALT: 12 U/L (ref 0–44)
AST: 22 U/L (ref 15–41)
Albumin: 4.1 g/dL (ref 3.5–5.0)
Alkaline Phosphatase: 78 U/L (ref 38–126)
Bilirubin, Direct: 0.4 mg/dL — ABNORMAL HIGH (ref 0.0–0.2)
Indirect Bilirubin: 0.5 mg/dL (ref 0.3–0.9)
Total Bilirubin: 0.9 mg/dL (ref 0.0–1.2)
Total Protein: 7.5 g/dL (ref 6.5–8.1)

## 2024-02-28 LAB — TROPONIN T, HIGH SENSITIVITY: Troponin T High Sensitivity: <15 ng/L (ref 0–19)

## 2024-02-28 MED ORDER — LACTATED RINGERS IV BOLUS: 1000.0000 mL | Freq: Once | INTRAVENOUS | Status: DC

## 2024-02-28 MED ORDER — LACTATED RINGERS IV BOLUS
1000.0000 mL | Freq: Once | INTRAVENOUS | Status: AC
  Administered 2024-02-28: 1000 mL via INTRAVENOUS

## 2024-02-28 MED ORDER — LACTATED RINGERS IV BOLUS
1500.0000 mL | Freq: Once | INTRAVENOUS | Status: AC
  Administered 2024-02-29: 1000 mL via INTRAVENOUS

## 2024-02-28 MED ORDER — SODIUM CHLORIDE 0.9 % IV SOLN
1.0000 g | Freq: Once | INTRAVENOUS | Status: AC
  Administered 2024-02-29: 1 g via INTRAVENOUS
  Filled 2024-02-28: qty 10

## 2024-02-28 MED ORDER — METRONIDAZOLE 500 MG/100ML IV SOLN
500.0000 mg | Freq: Once | INTRAVENOUS | Status: AC
  Administered 2024-02-29: 500 mg via INTRAVENOUS
  Filled 2024-02-28: qty 100

## 2024-02-28 NOTE — ED Notes (Signed)
 MD at bedside.

## 2024-02-28 NOTE — ED Triage Notes (Signed)
 Pt via POV with family c/o lethargy, hypotension 70s/40s, low O2 saturation at home. Recent changes to HTN med compliance. BP 78/54 in triage.

## 2024-02-28 NOTE — ED Provider Notes (Signed)
 [place] EMERGENCY DEPARTMENT AT [facility] Provider Note   CSN: [id number] Arrival date & time: 02/28/24  2145     Patient presents with: Hypotension   Nicholas Cooke is a 73 y.o. male.   HPI   Patient has history of prostate disease, hypertension, osteoarthritis, chronic kidney disease.  Patient used to take lisinopril  HCTZ and was switched to losartan .  Patient was out of his medications for the last few days.  He started taking 1-1/2 tablets of his lisinopril  HCTZ.  Last evening his blood pressure was in the 100s systolic.  Today his blood pressure has been in the 90s then down to the 80s and then this evening into the 70s.  Patient last took his blood pressure medications this morning.  He denies any chest pain.  He does not have any abdominal pain although has noted some pain in his lower back going down his leg.  He denies any fevers or chills.  He has had some loose stools recently but no vomiting no fevers.  Patient has been feeling weak and fatigued today  Prior to Admission medications   Medication Sig Start Date End Date Taking? Authorizing Provider  aspirin  EC 81 MG tablet Take 1 tablet (81 mg total) by mouth daily. Swallow whole. 12/14/22   Kayla Jeoffrey RAMAN, FNP  azelastine  (ASTELIN ) 0.1 % nasal spray Place 2 sprays into both nostrils 2 (two) times daily as needed for rhinitis. Use in each nostril as directed Patient not taking: Reported on 01/03/2024 06/22/23   Tobie Eldora NOVAK, MD  escitalopram  (LEXAPRO ) 20 MG tablet Take 1 tablet (20 mg total) by mouth daily. 05/27/23   Caudle, Thersia Bitters, FNP  gabapentin  (NEURONTIN ) 300 MG capsule Take 300 mg by mouth 2 (two) times daily. 06/29/23   [provider]  losartan  (COZAAR ) 25 MG tablet Take 1.5 tablets (37.5 mg total) by mouth daily. 02/26/24 03/27/24  Knute Thersia Bitters, FNP  metFORMIN  (GLUCOPHAGE -XR) 500 MG 24 hr tablet Take 1 tablet (500 mg total) by mouth daily with breakfast. 01/22/24   Caudle, Thersia Bitters, FNP  rosuvastatin  (CRESTOR ) 10 MG tablet Take 1 tablet (10 mg total) by mouth daily. 09/13/23   Caudle, Thersia Bitters, FNP  tadalafil (CIALIS) 5 MG tablet Take 5 mg by mouth daily as needed for erectile dysfunction.    [provider]  traZODone  (DESYREL ) 100 MG tablet Take 1 tablet (100 mg total) by mouth at bedtime. 05/27/23   Knute Thersia Bitters, FNP    Allergies: Patient has no known allergies.    Review of Systems  Updated Vital Signs BP (!) 80/53   Pulse 83   Temp 97.8 F (36.6 C) (Oral)   Resp 17   Ht 1.753 m (5' 9)   Wt 82.1 kg   SpO2 90%   BMI 26.73 kg/m   Physical Exam Vitals and nursing note reviewed.  Constitutional:      General: He is not in acute distress.    Appearance: He is well-developed.  HENT:     Head: Normocephalic and atraumatic.     Right Ear: External ear normal.     Left Ear: External ear normal.  Eyes:     General: No scleral icterus.       Right eye: No discharge.        Left eye: No discharge.     Conjunctiva/sclera: Conjunctivae normal.  Neck:     Trachea: No tracheal deviation.  Cardiovascular:     Rate and Rhythm:  Normal rate and regular rhythm.  Pulmonary:     Effort: Pulmonary effort is normal. No respiratory distress.     Breath sounds: Normal breath sounds. No stridor. No wheezing or rales.  Abdominal:     General: Bowel sounds are normal. There is no distension.     Palpations: Abdomen is soft.     Tenderness: There is no abdominal tenderness. There is no guarding or rebound.  Musculoskeletal:        General: No tenderness or deformity.     Cervical back: Neck supple.  Skin:    General: Skin is warm and dry.     Findings: No rash.  Neurological:     General: No focal deficit present.     Mental Status: He is alert.     Cranial Nerves: No cranial nerve deficit, dysarthria or facial asymmetry.     Sensory: No sensory deficit.     Motor: No abnormal muscle tone or seizure activity.     Coordination:  Coordination normal.  Psychiatric:        Mood and Affect: Mood normal.     (all labs ordered are listed, but only abnormal results are displayed) Labs Reviewed  BASIC METABOLIC PANEL WITH GFR - Abnormal; Notable for the following components:      Result Value   Sodium 132 (*)    Chloride 97 (*)    CO2 19 (*)    Glucose, Bld 120 (*)    BUN 24 (*)    Creatinine, Ser 2.79 (*)    GFR, Estimated 24 (*)    Anion gap 16 (*)    All other components within normal limits  CBC - Abnormal; Notable for the following components:   WBC 14.2 (*)    All other components within normal limits  HEPATIC FUNCTION PANEL - Abnormal; Notable for the following components:   Bilirubin, Direct 0.4 (*)    All other components within normal limits  CULTURE, BLOOD (ROUTINE X 2)  CULTURE, BLOOD (ROUTINE X 2)  URINALYSIS, ROUTINE W REFLEX MICROSCOPIC  LACTIC ACID, PLASMA  LACTIC ACID, PLASMA  TROPONIN T, HIGH SENSITIVITY  TROPONIN T, HIGH SENSITIVITY    EKG: EKG Interpretation Date/Time:  Wednesday February 28 2024 21:54:38 EDT Ventricular Rate:  93 PR Interval:  162 QRS Duration:  100 QT Interval:  374 QTC Calculation: 465 R Axis:   30  Text Interpretation: Normal sinus rhythm Normal ECG When compared with ECG of 03-Jul-2018 08:20, No significant change was found Confirmed by Randol Simmonds [phone]) on 02/28/2024 9:55:59 PM  Radiology: [facility] Chest Port 1 View Result Date: 02/28/2024 CLINICAL DATA:  Shortness of breath EXAM: PORTABLE CHEST 1 VIEW COMPARISON:  11/25/2018 FINDINGS: The heart size and mediastinal contours are within normal limits. Both lungs are clear. The visualized skeletal structures are unremarkable. No pneumothorax. IMPRESSION: No active disease. Electronically Signed   By: Franky Crease M.D.   On: 02/28/2024 22:06     .Critical Care  Performed by: Randol Simmonds, MD Authorized by: Randol Simmonds, MD   Critical care provider statement:    Critical care time (minutes):  30   Critical care  was time spent personally by me on the following activities:  Development of treatment plan with patient or surrogate, discussions with consultants, evaluation of patient's response to treatment, examination of patient, ordering and review of laboratory studies, ordering and review of radiographic studies, ordering and performing treatments and interventions, pulse oximetry, re-evaluation of patient's condition and review of old charts  Medications Ordered in the ED  lactated ringers  bolus 1,500 mL (has no administration in time range)  cefTRIAXone (ROCEPHIN) 1 g in sodium chloride  0.9 % 100 mL IVPB (has no administration in time range)  metroNIDAZOLE (FLAGYL) IVPB 500 mg (has no administration in time range)  lactated ringers  bolus 1,000 mL (1,000 mLs Intravenous New Bag/Given 02/28/24 2243)    Clinical Course as of 02/28/24 2353  Wed Feb 28, 2024  2321 Basic metabolic panel(!) Creatinine is elevated compared to previous [JK]  2321 CBC(!) Leukocytosis noted but no signs of infection at this time [JK]  2321 Troponin T, High Sensitivity Troponin normal [JK]  2322 Chest x-ray without acute findings [JK]  2343 Blood pressure improving slightly at the bedside 85/55.  Will continue IV fluid hydration.  Will CT abdomen pelvis.  Empiric antibiotics added [JK]    Clinical Course User Index [JK] Randol Simmonds, MD                                 Medical Decision Making Problems Addressed: AKI (acute kidney injury): acute illness or injury that poses a threat to life or bodily functions Hypotension, unspecified hypotension type: acute illness or injury that poses a threat to life or bodily functions  Amount and/or Complexity of Data Reviewed Labs: ordered. Decision-making details documented in ED Course. Radiology: ordered.  Risk Prescription drug management.   Patient presented to the ED with complaints of hypotension.  Patient does report continued blood pressure medication use with his  recent low blood pressures.  He also took an ACE inhibitor instead of his ARB.  Patient however also reports some recent diarrhea a few times per day.  He is having some abdominal discomfort.  I am concerned about the possibility of evolving sepsis with his hypotension although currently afebrile and only mild leukocytosis  It is possible his hypotension could be related to AKI continued use of blood pressure medications.  Will give additional fluid boluses empiric antibiotics.  Blood pressure slowly improving.  Will plan on CT scan of his abdomen pelvis for further evaluation.   Pt will require admission to the hospital.  Care turned over to Dr Geroldine     Final diagnoses:  AKI (acute kidney injury)  Hypotension, unspecified hypotension type    ED Discharge Orders     None          Randol Simmonds, MD 02/28/24 2355

## 2024-02-28 NOTE — ED Notes (Signed)
 Patient attempted urine sample but unable to urinate at this time.

## 2024-02-28 NOTE — ED Notes (Signed)
 Patient placed on 2L [place] at this time due to desaturations (to 85%). RN and EDP made aware.

## 2024-02-29 ENCOUNTER — Emergency Department (HOSPITAL_BASED_OUTPATIENT_CLINIC_OR_DEPARTMENT_OTHER)

## 2024-02-29 DIAGNOSIS — Z79899 Other long term (current) drug therapy: Secondary | ICD-10-CM | POA: Diagnosis not present

## 2024-02-29 DIAGNOSIS — E785 Hyperlipidemia, unspecified: Secondary | ICD-10-CM | POA: Diagnosis present

## 2024-02-29 DIAGNOSIS — N400 Enlarged prostate without lower urinary tract symptoms: Secondary | ICD-10-CM | POA: Diagnosis present

## 2024-02-29 DIAGNOSIS — Z96651 Presence of right artificial knee joint: Secondary | ICD-10-CM | POA: Diagnosis present

## 2024-02-29 DIAGNOSIS — E871 Hypo-osmolality and hyponatremia: Secondary | ICD-10-CM | POA: Diagnosis present

## 2024-02-29 DIAGNOSIS — R0682 Tachypnea, not elsewhere classified: Secondary | ICD-10-CM | POA: Diagnosis present

## 2024-02-29 DIAGNOSIS — Z8261 Family history of arthritis: Secondary | ICD-10-CM | POA: Diagnosis not present

## 2024-02-29 DIAGNOSIS — Z7982 Long term (current) use of aspirin: Secondary | ICD-10-CM | POA: Diagnosis not present

## 2024-02-29 DIAGNOSIS — R6520 Severe sepsis without septic shock: Secondary | ICD-10-CM | POA: Diagnosis present

## 2024-02-29 DIAGNOSIS — Z823 Family history of stroke: Secondary | ICD-10-CM | POA: Diagnosis not present

## 2024-02-29 DIAGNOSIS — Z7984 Long term (current) use of oral hypoglycemic drugs: Secondary | ICD-10-CM | POA: Diagnosis not present

## 2024-02-29 DIAGNOSIS — E872 Acidosis, unspecified: Secondary | ICD-10-CM | POA: Diagnosis present

## 2024-02-29 DIAGNOSIS — N1832 Chronic kidney disease, stage 3b: Secondary | ICD-10-CM | POA: Diagnosis not present

## 2024-02-29 DIAGNOSIS — K5732 Diverticulitis of large intestine without perforation or abscess without bleeding: Secondary | ICD-10-CM | POA: Diagnosis not present

## 2024-02-29 DIAGNOSIS — A419 Sepsis, unspecified organism: Secondary | ICD-10-CM | POA: Diagnosis not present

## 2024-02-29 DIAGNOSIS — I959 Hypotension, unspecified: Secondary | ICD-10-CM | POA: Diagnosis present

## 2024-02-29 DIAGNOSIS — I129 Hypertensive chronic kidney disease with stage 1 through stage 4 chronic kidney disease, or unspecified chronic kidney disease: Secondary | ICD-10-CM | POA: Diagnosis present

## 2024-02-29 DIAGNOSIS — K5720 Diverticulitis of large intestine with perforation and abscess without bleeding: Secondary | ICD-10-CM | POA: Diagnosis not present

## 2024-02-29 DIAGNOSIS — F339 Major depressive disorder, recurrent, unspecified: Secondary | ICD-10-CM | POA: Diagnosis present

## 2024-02-29 DIAGNOSIS — K5792 Diverticulitis of intestine, part unspecified, without perforation or abscess without bleeding: Secondary | ICD-10-CM | POA: Diagnosis not present

## 2024-02-29 DIAGNOSIS — M5416 Radiculopathy, lumbar region: Secondary | ICD-10-CM | POA: Insufficient documentation

## 2024-02-29 DIAGNOSIS — R7303 Prediabetes: Secondary | ICD-10-CM | POA: Diagnosis present

## 2024-02-29 DIAGNOSIS — Z91148 Patient's other noncompliance with medication regimen for other reason: Secondary | ICD-10-CM | POA: Diagnosis not present

## 2024-02-29 DIAGNOSIS — Z9181 History of falling: Secondary | ICD-10-CM | POA: Diagnosis not present

## 2024-02-29 DIAGNOSIS — M4727 Other spondylosis with radiculopathy, lumbosacral region: Secondary | ICD-10-CM | POA: Diagnosis present

## 2024-02-29 DIAGNOSIS — G8194 Hemiplegia, unspecified affecting left nondominant side: Secondary | ICD-10-CM | POA: Diagnosis present

## 2024-02-29 DIAGNOSIS — N179 Acute kidney failure, unspecified: Principal | ICD-10-CM | POA: Diagnosis present

## 2024-02-29 LAB — COMPREHENSIVE METABOLIC PANEL WITH GFR
ALT: 15 U/L (ref 0–44)
AST: 23 U/L (ref 15–41)
Albumin: 3.7 g/dL (ref 3.5–5.0)
Alkaline Phosphatase: 64 U/L (ref 38–126)
Anion gap: 8 (ref 5–15)
BUN: 18 mg/dL (ref 8–23)
CO2: 26 mmol/L (ref 22–32)
Calcium: 8.9 mg/dL (ref 8.9–10.3)
Chloride: 102 mmol/L (ref 98–111)
Creatinine, Ser: 1.99 mg/dL — ABNORMAL HIGH (ref 0.61–1.24)
GFR, Estimated: 35 mL/min — ABNORMAL LOW (ref >60)
Glucose, Bld: 99 mg/dL (ref 70–99)
Potassium: 4.4 mmol/L (ref 3.5–5.1)
Sodium: 136 mmol/L (ref 135–145)
Total Bilirubin: 0.7 mg/dL (ref 0.0–1.2)
Total Protein: 6.7 g/dL (ref 6.5–8.1)

## 2024-02-29 LAB — TROPONIN T, HIGH SENSITIVITY: Troponin T High Sensitivity: <15 ng/L (ref 0–19)

## 2024-02-29 LAB — CBC
HCT: 47.4 % (ref 39.0–52.0)
Hemoglobin: 15.4 g/dL (ref 13.0–17.0)
MCH: 29.1 pg (ref 26.0–34.0)
MCHC: 32.5 g/dL (ref 30.0–36.0)
MCV: 89.4 fL (ref 80.0–100.0)
Platelets: 193 K/uL (ref 150–400)
RBC: 5.3 MIL/uL (ref 4.22–5.81)
RDW: 13.2 % (ref 11.5–15.5)
WBC: 10.7 K/uL — ABNORMAL HIGH (ref 4.0–10.5)
nRBC: 0 % (ref 0.0–0.2)

## 2024-02-29 LAB — URINALYSIS, ROUTINE W REFLEX MICROSCOPIC
Bilirubin Urine: NEGATIVE
Glucose, UA: NEGATIVE mg/dL
Hgb urine dipstick: NEGATIVE
Ketones, ur: NEGATIVE mg/dL
Leukocytes,Ua: NEGATIVE
Nitrite: NEGATIVE
Protein, ur: NEGATIVE mg/dL
Specific Gravity, Urine: 1.012 (ref 1.005–1.030)
pH: 5 (ref 5.0–8.0)

## 2024-02-29 LAB — LACTIC ACID, PLASMA: Lactic Acid, Venous: 1.4 mmol/L (ref 0.5–1.9)

## 2024-02-29 LAB — MAGNESIUM: Magnesium: 2.1 mg/dL (ref 1.7–2.4)

## 2024-02-29 LAB — PHOSPHORUS: Phosphorus: 3.6 mg/dL (ref 2.5–4.6)

## 2024-02-29 MED ORDER — ONDANSETRON HCL 4 MG/2ML IJ SOLN: 4.0000 mg | Freq: Four times a day (QID) | INTRAMUSCULAR | Status: DC | PRN

## 2024-02-29 MED ORDER — LACTATED RINGERS IV SOLN: INTRAVENOUS | Status: AC

## 2024-02-29 MED ORDER — ASPIRIN 81 MG PO TBEC
81.0000 mg | DELAYED_RELEASE_TABLET | Freq: Every day | ORAL | Status: DC
  Administered 2024-02-29 – 2024-03-02 (×3): 81 mg via ORAL
  Filled 2024-02-29 (×3): qty 1

## 2024-02-29 MED ORDER — MORPHINE SULFATE (PF) 4 MG/ML IV SOLN
4.0000 mg | Freq: Once | INTRAVENOUS | Status: AC
  Administered 2024-02-29: 4 mg via INTRAVENOUS
  Filled 2024-02-29: qty 1

## 2024-02-29 MED ORDER — ESCITALOPRAM OXALATE 20 MG PO TABS
20.0000 mg | ORAL_TABLET | Freq: Every day | ORAL | Status: DC
  Administered 2024-02-29 – 2024-03-02 (×3): 20 mg via ORAL
  Filled 2024-02-29 (×3): qty 1

## 2024-02-29 MED ORDER — TRAZODONE HCL 100 MG PO TABS
100.0000 mg | ORAL_TABLET | Freq: Every day | ORAL | Status: DC
  Administered 2024-02-29 – 2024-03-01 (×2): 100 mg via ORAL
  Filled 2024-02-29 (×2): qty 1

## 2024-02-29 MED ORDER — ACETAMINOPHEN 650 MG RE SUPP: 650.0000 mg | Freq: Four times a day (QID) | RECTAL | Status: DC | PRN

## 2024-02-29 MED ORDER — SODIUM CHLORIDE 0.9 % IV SOLN
2.0000 g | INTRAVENOUS | Status: DC
  Administered 2024-02-29 – 2024-03-01 (×2): 2 g via INTRAVENOUS
  Filled 2024-02-29 (×2): qty 20

## 2024-02-29 MED ORDER — METRONIDAZOLE 500 MG/100ML IV SOLN
500.0000 mg | Freq: Two times a day (BID) | INTRAVENOUS | Status: AC
Start: 2024-02-29 — End: ?
  Administered 2024-02-29 – 2024-03-02 (×4): 500 mg via INTRAVENOUS
  Filled 2024-02-29 (×4): qty 100

## 2024-02-29 MED ORDER — ACETAMINOPHEN 325 MG PO TABS: 650.0000 mg | ORAL_TABLET | Freq: Four times a day (QID) | ORAL | Status: DC | PRN

## 2024-02-29 MED ORDER — LACTATED RINGERS IV BOLUS
1000.0000 mL | Freq: Once | INTRAVENOUS | Status: AC
  Administered 2024-02-29: 1000 mL via INTRAVENOUS

## 2024-02-29 MED ORDER — HYDROMORPHONE HCL 1 MG/ML IJ SOLN
0.5000 mg | INTRAMUSCULAR | Status: DC | PRN
  Filled 2024-02-29: qty 0.5

## 2024-02-29 MED ORDER — LACTATED RINGERS IV BOLUS
500.0000 mL | Freq: Once | INTRAVENOUS | Status: AC
  Administered 2024-02-29: 500 mL via INTRAVENOUS

## 2024-02-29 MED ORDER — GABAPENTIN 300 MG PO CAPS
300.0000 mg | ORAL_CAPSULE | Freq: Two times a day (BID) | ORAL | Status: DC
  Administered 2024-02-29 – 2024-03-02 (×4): 300 mg via ORAL
  Filled 2024-02-29 (×4): qty 1

## 2024-02-29 MED ORDER — ROSUVASTATIN CALCIUM 10 MG PO TABS
10.0000 mg | ORAL_TABLET | Freq: Every day | ORAL | Status: DC
  Administered 2024-02-29 – 2024-03-02 (×3): 10 mg via ORAL
  Filled 2024-02-29 (×3): qty 1

## 2024-02-29 MED ORDER — ONDANSETRON HCL 4 MG PO TABS: 4.0000 mg | ORAL_TABLET | Freq: Four times a day (QID) | ORAL | Status: DC | PRN

## 2024-02-29 MED ORDER — FENTANYL CITRATE (PF) 50 MCG/ML IJ SOSY
50.0000 ug | PREFILLED_SYRINGE | Freq: Once | INTRAMUSCULAR | Status: AC
  Administered 2024-02-29: 50 ug via INTRAVENOUS
  Filled 2024-02-29: qty 1

## 2024-02-29 MED ORDER — HYDROMORPHONE HCL 1 MG/ML IJ SOLN
0.5000 mg | INTRAMUSCULAR | Status: DC | PRN
  Administered 2024-02-29 – 2024-03-01 (×4): 0.5 mg via INTRAVENOUS
  Filled 2024-02-29 (×3): qty 0.5

## 2024-02-29 NOTE — ED Provider Notes (Addendum)
  Physical Exam  BP 92/66   Pulse 68   Temp 97.7 F (36.5 C) (Oral)   Resp 10   Ht 5' 9 (1.753 m)   Wt 82.1 kg   SpO2 93%   BMI 26.73 kg/m   Physical Exam  Procedures  Procedures  ED Course / MDM   Clinical Course as of 02/29/24 0742  Wed Feb 28, 2024  2321 Basic metabolic panel(!) Creatinine is elevated compared to previous [JK]  2321 CBC(!) Leukocytosis noted but no signs of infection at this time [JK]  2321 Troponin T, High Sensitivity Troponin normal [JK]  2322 Chest x-ray without acute findings [JK]  2343 Blood pressure improving slightly at the bedside 85/55.  Will continue IV fluid hydration.  Will CT abdomen pelvis.  Empiric antibiotics added [JK]    Clinical Course User Index [JK] Randol Simmonds, MD   Medical Decision Making Amount and/or Complexity of Data Reviewed Labs: ordered. Decision-making details documented in ED Course. Radiology: ordered.  Risk Prescription drug management. Decision regarding hospitalization.   Patient boarding overnight in our emergency room.  Diverticulitis.  Blood pressure is improved.  Nontachycardic.  Patient looks quite comfortable in the room.  States he is now having a little bit of discomfort across his lower abdomen.  Abdomen soft.  Have ordered some fentanyl  for the patient.  Reassessment 9:10 AM-patient had couple of soft blood pressures.  Went and reevaluated the patient.  Overall continue to look well.  He had good DP pulses.  Ordered another liter of fluid, patient's maps improved to the 70s.  He was complaining of some pain in his left leg.  He has good pulses in his left leg.  There is no swelling, erythema or warmth.  Leg well-perfused.  He had some tenderness in his left IT band that appeared to be musculoskeletal.      Mannie Pac T, DO 02/29/24 0743    Mannie Pac T, DO 02/29/24 [phone]

## 2024-02-29 NOTE — ED Notes (Signed)
 Provided pt with raisin bran cereal and milk. Also gave wife some oatmeal.

## 2024-02-29 NOTE — H&P (Signed)
 History and Physical    Patient: Nicholas Cooke FMW:[id number] DOB: 04/21/1951 DOA: 02/28/2024 DOS: the patient was seen and examined on 02/29/2024 PCP: Knute Thersia Bitters, FNP  Patient coming from: Home  Chief Complaint:  Chief Complaint  Patient presents with   Hypotension   HPI: Nicholas Cooke is a 73 y.o. 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  No nausea, emesis, constipation, melena or hematochezia.  No flank pain, dysuria, frequency or hematuria.  She has also LLQ pain, multiple episodes of diarrhea and also stated that he is having lower back pain radiating to his left lower extremity. He denied fever, chills, but has had night sweats.  No rhinorrhea, sore throat, wheezing or hemoptysis.  No chest pain, palpitations, diaphoresis, PND, orthopnea or pitting edema of the lower extremities.  No polyuria, polydipsia, polyphagia or blurred vision.   ED course: Initial vital signs were temperature 97.8 F, pulse 92, respiration 20, BP 78/54 mmHg and O2 sat 92% on room air.  The patient received analgesics, LR 3000 mm bolus followed by LR 1000 mL/h for 1 day, ceftriaxone 1 g IVPB and metronidazole 500 mg IVPB.  Lab work: Urinalysis was normal.  CBC showed a white count of 14.2, hemoglobin 15.8 g/dL platelets 787.  Troponin x 2 and lactic acid were normal.  LFTs were unremarkable except for a direct bilirubin of 0.4 mg/dL.  BMP shows sodium 132, potassium 3.7, chloride 97 and CO2 19 mmol/L with an anion gap of 16.  Glucose 120, BUN 24, creatinine 2.79 and calcium  9.0 mg/dL.  In the past 10  months, his creatinine has ranged from 1.46 to 1.78 mg/dL.  Imaging: CT abdomen/pelvis without contrast showing left colonic diverticulitis.  No abscess or free air.   Review of Systems: As mentioned in the history of present illness. All other systems reviewed and are negative. Past Medical History:  Diagnosis Date   Chronic kidney disease    Dental crowns present    Depression    Enlarged prostate    Hypertension    states under control with med., has been on med. x 2 yr.   Inguinal hernia 12/2015   Osteoarthritis    Past Surgical History:  Procedure Laterality Date   APPENDECTOMY  yrs ago   CYSTOSCOPY WITH INSERTION OF UROLIFT N/A 07/03/2018   Procedure: CYSTOSCOPY WITH INSERTION OF UROLIFT;  Surgeon: Nieves Cough, MD;  Location: [facility];  Service: Urology;  Laterality: N/A;  ONLY NEEDS 30 MIN FOR PROCEDURE   HERNIA REPAIR  2014   INGUINAL HERNIA REPAIR  02/16/2012   Procedure: HERNIA REPAIR INGUINAL ADULT;  Surgeon: Vicenta DELENA Poli, MD;  Location: [facility];  Service: General;  Laterality: Left;  left inguinal hernia repair with mesh   INGUINAL HERNIA REPAIR Bilateral 12/23/2015   Procedure: LAPAROSCOPIC LEFT INGUINAL HERNIA WITH MESH;  Surgeon: Vicenta Poli, MD;  Location: [facility];  Service: General;  Laterality: Bilateral;   INSERTION OF MESH Bilateral 12/23/2015   Procedure: INSERTION OF MESH;  Surgeon: Vicenta Poli, MD;  Location: [facility];  Service: General;  Laterality: Bilateral;   JOINT REPLACEMENT     KNEE ARTHROSCOPY WITH MEDIAL MENISECTOMY Right 10/18/2013  Procedure: RIGHT KNEE ARTHROSCOPY WITH PARTIAL MEDIAL MENISECTOMY;  Surgeon: Reyes JAYSON Billing, MD;  Location: [facility];  Service: Orthopedics;  Laterality: Right;   SHOULDER ARTHROSCOPY WITH ROTATOR CUFF REPAIR Left 2001   TOTAL KNEE ARTHROPLASTY Right 05/14/2015   Procedure: REMOVAL OF HARDWARE AND RIGHT TOTAL KNEE ARTHROPLASTY;   Surgeon: Reyes Billing, MD;  Location: [facility];  Service: Orthopedics;  Laterality: Right;   ULNAR NERVE REPAIR Right 1996   Social History:  reports that he has never smoked. He has never used smokeless tobacco. He reports that he does not drink alcohol and does not use drugs.  No Known Allergies  Family History  Problem Relation Age of Onset   Arthritis Mother    Stroke Father    Early death Sister     Prior to Admission medications   Medication Sig Start Date End Date Taking? Authorizing Provider  aspirin  EC 81 MG tablet Take 1 tablet (81 mg total) by mouth daily. Swallow whole. 12/14/22   Kayla Jeoffrey RAMAN, FNP  azelastine  (ASTELIN ) 0.1 % nasal spray Place 2 sprays into both nostrils 2 (two) times daily as needed for rhinitis. Use in each nostril as directed Patient not taking: Reported on 01/03/2024 06/22/23   Tobie Eldora NOVAK, MD  escitalopram  (LEXAPRO ) 20 MG tablet Take 1 tablet (20 mg total) by mouth daily. 05/27/23   Caudle, Thersia Bitters, FNP  gabapentin  (NEURONTIN ) 300 MG capsule Take 300 mg by mouth 2 (two) times daily. 06/29/23   [provider]  losartan  (COZAAR ) 25 MG tablet Take 1.5 tablets (37.5 mg total) by mouth daily. 02/26/24 03/27/24  Knute Thersia Bitters, FNP  metFORMIN  (GLUCOPHAGE -XR) 500 MG 24 hr tablet Take 1 tablet (500 mg total) by mouth daily with breakfast. 01/22/24   Caudle, Thersia Bitters, FNP  rosuvastatin  (CRESTOR ) 10 MG tablet Take 1 tablet (10 mg total) by mouth daily. 09/13/23   Caudle, Thersia Bitters, FNP  tadalafil (CIALIS) 5 MG tablet Take 5 mg by mouth daily as needed for erectile dysfunction.    [provider]  traZODone  (DESYREL ) 100 MG tablet Take 1 tablet (100 mg total) by mouth at bedtime. 05/27/23   Knute Thersia Bitters, FNP    Physical Exam: Vitals:   02/29/24 1200 02/29/24 1215 02/29/24 1230 02/29/24 1344  BP: 95/70 104/63 95/68 118/71  Pulse: 66 64 62 60  Resp: 10 17 19 18   Temp: 97.9 F (36.6 C)   98.1 F (36.7 C)  TempSrc:  Oral   Oral  SpO2: 92% 92% 95% 96%  Weight:      Height:       Physical Exam Vitals and nursing note reviewed.  Constitutional:      General: He is awake. He is not in acute distress.    Appearance: He is ill-appearing.  HENT:     Head: Normocephalic.     Nose: No rhinorrhea.     Mouth/Throat:     Mouth: Mucous membranes are moist.  Eyes:     General: No scleral icterus.    Pupils: Pupils are equal, round, and reactive to light.  Neck:     Vascular: No JVD.  Cardiovascular:     Rate and Rhythm: Normal rate and regular rhythm.     Heart sounds: S1 normal and S2 normal.  Pulmonary:     Effort: Pulmonary effort is normal.     Breath sounds: Normal breath sounds. No wheezing, rhonchi or rales.  Abdominal:     General: Bowel sounds are normal.  There is no distension.     Palpations: Abdomen is soft.     Tenderness: There is abdominal tenderness in the left lower quadrant. There is no right CVA tenderness, left CVA tenderness, guarding or rebound.  Musculoskeletal:     Cervical back: Neck supple.     Right lower leg: No edema.     Left lower leg: No edema.  Skin:    General: Skin is warm and dry.  Neurological:     General: No focal deficit present.     Mental Status: He is alert and oriented to person, place, and time.  Psychiatric:        Mood and Affect: Mood normal.        Behavior: Behavior normal. Behavior is cooperative.     Data Reviewed:  Results are pending, will review when available.  EKG: Vent. rate 93 BPM PR interval 162 ms QRS duration 100 ms QT/QTcB 374/465 ms P-R-T axes 52 30 42 Normal sinus rhythm Normal ECG  Assessment and Plan: Principal Problem:   Diverticulosis Complicated by:   Acute diverticulitis Admit to PCU/inpatient. Continue IV fluids. Continue ceftriaxone 2 g every 24 hours.   Continue metronidazole 500 mg IVPB q 12 hr. Analgesics as needed. Antiemetics as needed. Follow-up blood culture and sensitivity Follow CBC and CMP  in a.m.  Active Problems:   Metabolic acidosis Due to:   AKI (acute kidney injury) Superimposed on:   Stage 3b chronic kidney disease (HCC) Secondary to:   Hypotension Continue IV fluids. Hold ARB/ACE. Avoid hypotension. Avoid nephrotoxins. Monitor intake and output. Monitor renal function electrolytes.    Lumbosacral spondylosis with radiculopathy Analgesics as needed. Had recent injections on spine.    Hemiplegia affecting nondominant side (HCC) Supportive care.    Essential hypertension Holding ARB. Will consider ordering amlodipine if needed.    Hyperlipidemia Continue rosuvastatin  10 mg p.o. daily.    Depression, recurrent Continue escitalopram  20 mg p.o. daily. Continue trazodone  100 mg p.o. bedtime.    Prediabetes Blood glucose was normal.    Hyponatremia Minimal. Already resolved.    Advance Care Planning:   Code Status: Full Code   Consults: His wife and daughter were at bedside.  Family Communication:   Severity of Illness: The appropriate patient status for this patient is INPATIENT. Inpatient status is judged to be reasonable and necessary in order to provide the required intensity of service to ensure the patient's safety. The patient's presenting symptoms, physical exam findings, and initial radiographic and laboratory data in the context of their chronic comorbidities is felt to place them at high risk for further clinical deterioration. Furthermore, it is not anticipated that the patient will be medically stable for discharge from the hospital within 2 midnights of admission.   * I certify that at the point of admission it is my clinical judgment that the patient will require inpatient hospital care spanning beyond 2 midnights from the point of admission due to high intensity of service, high risk for further deterioration and high frequency of surveillance required.*  Author: Alm Dorn Castor, MD 02/29/2024 2:12 PM  For on call review  [email or web address].   This document was prepared using Dragon voice recognition software and may contain some unintended transcription errors.

## 2024-02-29 NOTE — ED Notes (Signed)
 Provider aware of low bp. Wants pressure bag of 500 mL to raise pressure

## 2024-02-29 NOTE — ED Notes (Addendum)
 Pressure bagged 500 mL per provider to raise bp.

## 2024-02-29 NOTE — ED Notes (Signed)
 MD at bedside.

## 2024-02-29 NOTE — ED Notes (Signed)
 [facility] with cl called for transport

## 2024-03-01 DIAGNOSIS — K5792 Diverticulitis of intestine, part unspecified, without perforation or abscess without bleeding: Secondary | ICD-10-CM | POA: Diagnosis not present

## 2024-03-01 LAB — COMPREHENSIVE METABOLIC PANEL WITH GFR
ALT: 16 U/L (ref 0–44)
AST: 24 U/L (ref 15–41)
Albumin: 3.5 g/dL (ref 3.5–5.0)
Alkaline Phosphatase: 69 U/L (ref 38–126)
Anion gap: 8 (ref 5–15)
BUN: 16 mg/dL (ref 8–23)
CO2: 27 mmol/L (ref 22–32)
Calcium: 8.9 mg/dL (ref 8.9–10.3)
Chloride: 105 mmol/L (ref 98–111)
Creatinine, Ser: 1.46 mg/dL — ABNORMAL HIGH (ref 0.61–1.24)
GFR, Estimated: 51 mL/min — ABNORMAL LOW (ref >60)
Glucose, Bld: 89 mg/dL (ref 70–99)
Potassium: 4.3 mmol/L (ref 3.5–5.1)
Sodium: 140 mmol/L (ref 135–145)
Total Bilirubin: 0.3 mg/dL (ref 0.0–1.2)
Total Protein: 6.1 g/dL — ABNORMAL LOW (ref 6.5–8.1)

## 2024-03-01 LAB — CBC
HCT: 43.1 % (ref 39.0–52.0)
Hemoglobin: 13.9 g/dL (ref 13.0–17.0)
MCH: 28.7 pg (ref 26.0–34.0)
MCHC: 32.3 g/dL (ref 30.0–36.0)
MCV: 88.9 fL (ref 80.0–100.0)
Platelets: 197 K/uL (ref 150–400)
RBC: 4.85 MIL/uL (ref 4.22–5.81)
RDW: 12.8 % (ref 11.5–15.5)
WBC: 7.4 K/uL (ref 4.0–10.5)
nRBC: 0 % (ref 0.0–0.2)

## 2024-03-01 LAB — HIV ANTIBODY (ROUTINE TESTING W REFLEX): HIV Screen 4th Generation wRfx: NONREACTIVE

## 2024-03-01 MED ORDER — OXYCODONE HCL 5 MG PO TABS
5.0000 mg | ORAL_TABLET | ORAL | Status: DC | PRN
  Administered 2024-03-01 (×2): 10 mg via ORAL
  Filled 2024-03-01 (×2): qty 2

## 2024-03-01 NOTE — Progress Notes (Signed)
 PROGRESS NOTE    Nicholas Cooke  FMW:[id number] DOB: Jun 30, 1950 DOA: 02/28/2024 PCP: Knute Thersia Bitters, FNP   Brief Narrative:  Nicholas Cooke is a 73 y.o. 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  Hospitalist called for admission.  Assessment & Plan:   Principal Problem:   Acute diverticulitis Active Problems:   Hemiplegia affecting nondominant side (HCC)   Essential hypertension   Stage 3b chronic kidney disease (HCC)   Diverticulosis   Hyperlipidemia   Depression, recurrent   Prediabetes   Lumbosacral spondylosis with radiculopathy   AKI (acute kidney injury)   Hyponatremia   Metabolic acidosis   Hypotension   Sepsis secondary to acute diverticulitis, recurrent Transient hypotension without shock -Patient and family report multiple episodes of diverticulitis over the past few years becoming more frequent, last noted episode just 3 months ago  - Sepsis criteria met with leukocytosis, tachypnea and notable source - CT abdomen pelvis confirms left colonic diverticulitis without abscess or free air - Given recurrent episodes General Surgery was sidelined, recommending outpatient follow-up for further discussion on elective hemicolectomy -there is no indication for urgent or emergent procedure at this time - Continue broad-spectrum antibiotic coverage at this time including ceftriaxone, Flagyl - Transition from IV to p.o. analgesics, follow cultures -Continue IV fluids, advance diet as tolerated - Pending improvement plan for discharge on p.o. antibiotics in the next 48 to 72 hours  certainly reasonable.   Anion gap metabolic acidosis Secondary to AKI on stage 3b chronic kidney disease - Lactic acid within normal limits, gap closed with IV fluids and supportive care, most recent bicarb 27  Hypotension -Transient secondary to above, resolving with fluids  - Hold antihypertensives at this time to avoid hypotension  - Resume home losartan  once more appropriate  Lumbosacral spondylosis with radiculopathy Chronic, status post recent injection, currently well-controlled   Hemiplegia affecting nondominant side (HCC) Supportive care. Essential hypertension hold losartan  -resume in 24 to 48 hours if indicated Hyperlipidemia Continue rosuvastatin  10 mg p.o. daily. Depression, recurrent Continue escitalopram , Trazodone  Prediabetes -prior A1c 5.9, diet controlled Hypovolemic  -resolved, not likely clinically relevant at this time   DVT prophylaxis: SCDs Start: 02/29/24 1426 -holding chemical prophylaxis given high risk for bleeding in setting of diverticulitis Code Status:   Code Status: Full Code Family Communication: Wife at bedside, niece over the phone  Status is: Inpatient  Dispo: The patient is from: Home              Anticipated d/c is to: Home              Anticipated d/c date is: 48 to 72 hours              Patient currently not medically stable for discharge  Consultants:  General surgery  Procedures:  None  Antimicrobials:  Ceftriaxone, Flagyl  Subjective: No acute issues or events overnight, patient feeling markedly improved with IV fluids and supportive care.  Continues to have episodes of diarrhea as well as left lower quadrant abdominal pain that is sharp but transient.  He indicates his pain is currently poorly controlled on IV narcotics alone, transition to p.o. as above  Objective: Vitals:   03/01/24 0157 03/01/24 0558 03/01/24 1038 03/01/24 1301  BP: ROLLEN)  101/53 131/87 114/76 112/74  Pulse: 75 76 71 81  Resp:   20 20  Temp: 97.6 F (36.4  C) 98.5 F (36.9 C) 98.4 F (36.9 C) 98 F (36.7 C)  TempSrc: Oral Oral Oral Oral  SpO2: 91% 95% 96% 96%  Weight:      Height:        Intake/Output Summary (Last 24 hours) at 03/01/2024 1535 Last data filed at 03/01/2024 0900 Gross per 24 hour  Intake 1646.39 ml  Output 1400 ml  Net 246.39 ml   Filed Weights   02/28/24 2151  Weight: 82.1 kg    Examination:  General:  Pleasantly resting in bed, No acute distress. HEENT:  Normocephalic atraumatic.  Sclerae nonicteric, noninjected.  Extraocular movements intact bilaterally. Neck:  Without mass or deformity.  Trachea is midline. Lungs:  Clear to auscultate bilaterally without rhonchi, wheeze, or rales. Heart:  Regular rate and rhythm.  Without murmurs, rubs, or gallops. Abdomen:  Soft, tender at the left lower quadrant.  Without guarding or rebound. Extremities: Without cyanosis, clubbing, edema, or obvious deformity. Skin:  Warm and dry, no erythema.   Data Reviewed: I have personally reviewed following labs and imaging studies  CBC: Recent Labs  Lab 02/28/24 2156 02/29/24 1432 03/01/24 0400  WBC 14.2* 10.7* 7.4  HGB 15.8 15.4 13.9  HCT 46.7 47.4 43.1  MCV 86.5 89.4 88.9  PLT 212 193 197   Basic Metabolic Panel: Recent Labs  Lab 02/28/24 2156 02/29/24 1432 03/01/24 0400  NA 132* 136 140  K 3.7 4.4 4.3  CL 97* 102 105  CO2 19* 26 27  GLUCOSE 120* 99 89  BUN 24* 18 16  CREATININE 2.79* 1.99* 1.46*  CALCIUM  9.0 8.9 8.9  MG  --  2.1  --   PHOS  --  3.6  --    GFR: Estimated Creatinine Clearance: 47.1 mL/min (A) (by C-G formula based on SCr of 1.46 mg/dL (H)). Liver Function Tests: Recent Labs  Lab 02/28/24 2228 02/29/24 1432 03/01/24 0400  AST 22 23 24   ALT 12 15 16   ALKPHOS 78 64 69  BILITOT 0.9 0.7 0.3  PROT 7.5 6.7 6.1*  ALBUMIN 4.1 3.7 3.5   No results for input(s): LIPASE, AMYLASE in the last 168 hours. No results for input(s): AMMONIA in the last 168 hours. Coagulation  Profile: No results for input(s): INR, PROTIME in the last 168 hours. Cardiac Enzymes: No results for input(s): CKTOTAL, CKMB, CKMBINDEX, TROPONINI in the last 168 hours. BNP (last 3 results) No results for input(s): PROBNP in the last 8760 hours. HbA1C: No results for input(s): HGBA1C in the last 72 hours. CBG: No results for input(s): GLUCAP in the last 168 hours. Lipid Profile: No results for input(s): CHOL, HDL, LDLCALC, TRIG, CHOLHDL, LDLDIRECT in the last 72 hours. Thyroid Function Tests: No results for input(s): TSH, T4TOTAL, FREET4, T3FREE, THYROIDAB in the last 72 hours. Anemia Panel: No results for input(s): VITAMINB12, FOLATE, FERRITIN, TIBC, IRON, RETICCTPCT in the last 72 hours. Sepsis Labs: Recent Labs  Lab 02/29/24 0043  LATICACIDVEN 1.4    Recent Results (from the past 240 hours)  Blood culture (routine x 2)     Status: None (Preliminary result)   Collection Time: 02/28/24 10:28 PM   Specimen: BLOOD RIGHT FOREARM  Result Value Ref Range Status   Specimen Description   Final    BLOOD RIGHT FOREARM Performed at [facility], [address], [place], [place] [address]    Special  Requests   Final    BOTTLES DRAWN AEROBIC AND ANAEROBIC Blood Culture adequate volume Performed at [facility], [address] [place], [place] [address]    Culture   Final    NO GROWTH < 24 HOURS Performed at [facility], [address], [place], [place] [address]    Report Status PENDING  Incomplete  Blood culture (routine x 2)     Status: None (Preliminary result)   Collection Time: 02/28/24 10:33 PM   Specimen: BLOOD RIGHT FOREARM  Result Value Ref Range Status   Specimen Description   Final    BLOOD RIGHT FOREARM Performed at [facility], [address] [place], [place] [address]    Special Requests   Final    BOTTLES DRAWN AEROBIC AND ANAEROBIC Blood  Culture adequate volume Performed at [facility], [address], [place], [place] [address]    Culture   Final    NO GROWTH < 24 HOURS Performed at [facility], [address] [place], [place] [address]    Report Status PENDING  Incomplete         Radiology Studies: CT ABDOMEN PELVIS WO CONTRAST Result Date: 02/29/2024 EXAM: CT ABDOMEN AND PELVIS WITHOUT CONTRAST 02/29/2024 12:25:06 AM TECHNIQUE: CT of the abdomen and pelvis was performed without the administration of intravenous contrast. Multiplanar reformatted images are provided for review. Automated exposure control, iterative reconstruction, and/or weight-based adjustment of the mA/kV was utilized to reduce the radiation dose to as low as reasonably achievable. COMPARISON: 04/16/2022 CLINICAL HISTORY: Sepsis. Pt via POV with family c/o lethargy, hypotension 70s/40s, low O2 saturation at home. Recent changes to HTN med compliance. BP 78/54 in triage. FINDINGS: LOWER CHEST: Mild subpleural scarring/atelectasis in the new lingula and bilateral lower lobes. LIVER: The liver is unremarkable. GALLBLADDER AND BILE DUCTS: Layering mild gallbladder sludge versus noncalcified gallstones (image 27) without associated inflammatory changes. No biliary ductal dilatation. SPLEEN: No acute abnormality. PANCREAS: No acute abnormality. ADRENAL GLANDS: No acute abnormality. KIDNEYS, URETERS AND BLADDER: No stones in the kidneys or ureters. No hydronephrosis. No perinephric or periureteral stranding. Urinary bladder is unremarkable. GI AND BOWEL: Stomach demonstrates no acute abnormality. Left colonic diverticulosis with mild diverticulitis along the distal descending colon (image 50). There is no bowel obstruction. PERITONEUM AND RETROPERITONEUM: No ascites. No free air to suggest macroscopic perforation. No drainable fluid collection/abscess. VASCULATURE: Aorta is normal in caliber. LYMPH NODES: No lymphadenopathy. REPRODUCTIVE  ORGANS: Prostatomegaly with postsurgical changes related to urolift procedure. BONES AND SOFT TISSUES: No acute osseous abnormality. No focal soft tissue abnormality. IMPRESSION: 1. Left colonic diverticulitis, as above. No abscess or free air. Electronically signed by: Pinkie Pebbles MD 02/29/2024 12:28 AM EDT RP Workstation: [id number]   DG Chest Port 1 View Result Date: 02/28/2024 CLINICAL DATA:  Shortness of breath EXAM: PORTABLE CHEST 1 VIEW COMPARISON:  11/25/2018 FINDINGS: The heart size and mediastinal contours are within normal limits. Both lungs are clear. The visualized skeletal structures are unremarkable. No pneumothorax. IMPRESSION: No active disease. Electronically Signed   By: Franky Crease M.D.   On: 02/28/2024 22:06   Scheduled Meds:  aspirin  EC  81 mg Oral Daily   escitalopram   20 mg Oral Daily   gabapentin   300 mg Oral BID   rosuvastatin   10 mg Oral Daily   traZODone   100 mg Oral QHS   Continuous Infusions:  cefTRIAXone (ROCEPHIN)  IV 2 g (02/29/24 2135)   metronidazole 500 mg (03/01/24 0618)  LOS: 1 day   Time spent:  Elsie JAYSON Montclair, DO [facility]  If 7PM-7AM, please contact night-coverage [email or web address]  03/01/2024, 3:35 PM

## 2024-03-01 NOTE — Consult Note (Signed)
 Nicholas Cooke 10/27/1950  [id number].    Requesting MD: Dr. Elsie Montclair Chief Complaint/Reason for Consult: Diverticulitis  HPI: Nicholas Cooke is a 73 y.o. male with a history of diverticulitis who we are asked to see for recurrent diverticulitis.  Patient reports that he has had at least 7 flares of diverticulitis since 2010.  He reports for the last 2 years to become more frequent with at least 4 episodes.  He reports he has had to start depression medication secondary to his recurrent episodes of diverticulitis and has interested in talking about surgery for his recurrent diverticulitis.  He follows with [facility], Dr. Saintclair, for this.  He reports he had a colonoscopy in 2023 that showed pandiverticulosis as well as polyps that were benign.  I cannot see this report in our system.  He reports normally he will develop LLQ abdominal pain when he has a flare of his diverticulitis and he can manage these with outpatient antibiotics.  This most recent episode started 2-3 days ago with LLQ abdominal pain with radiation to his suprapubic abdomen.  He was noted to have soft blood pressures at home which prompted his visit to the ED.  He denies any fever, nausea, vomiting or hematochezia.  He had a CT A/P that showed left colonic diverticulitis without abscess or free air.  He was admitted and started on antibiotics.  His blood pressure has improved since admission and he is currently HDS.  WBC has normalized.  He reports he is tolerating a regular diet and had a small BM this morning.  He is on a baby aspirin  but otherwise denies any blood thinners.  Reports history of left inguinal hernia repair with mesh in 2013 and 2017.   ROS: ROS As above, see hpi  Family History  Problem Relation Age of Onset   Arthritis Mother    Stroke Father    Early death Sister     Past Medical History:  Diagnosis Date   Chronic kidney disease    Dental crowns present    Depression    Enlarged prostate     Hypertension    states under control with med., has been on med. x 2 yr.   Inguinal hernia 12/2015   Osteoarthritis     Past Surgical History:  Procedure Laterality Date   APPENDECTOMY  yrs ago   CYSTOSCOPY WITH INSERTION OF UROLIFT N/A 07/03/2018   Procedure: CYSTOSCOPY WITH INSERTION OF UROLIFT;  Surgeon: Nieves Cough, MD;  Location: [facility];  Service: Urology;  Laterality: N/A;  ONLY NEEDS 30 MIN FOR PROCEDURE   HERNIA REPAIR  2014   INGUINAL HERNIA REPAIR  02/16/2012   Procedure: HERNIA REPAIR INGUINAL ADULT;  Surgeon: Vicenta DELENA Poli, MD;  Location: [facility];  Service: General;  Laterality: Left;  left inguinal hernia repair with mesh   INGUINAL HERNIA REPAIR Bilateral 12/23/2015   Procedure: LAPAROSCOPIC LEFT INGUINAL HERNIA WITH MESH;  Surgeon: Vicenta Poli, MD;  Location: [facility];  Service: General;  Laterality: Bilateral;   INSERTION OF MESH Bilateral 12/23/2015   Procedure: INSERTION OF MESH;  Surgeon: Vicenta Poli, MD;  Location: [facility];  Service: General;  Laterality: Bilateral;   JOINT REPLACEMENT     KNEE ARTHROSCOPY WITH MEDIAL MENISECTOMY Right 10/18/2013   Procedure: RIGHT KNEE ARTHROSCOPY WITH PARTIAL MEDIAL MENISECTOMY;  Surgeon: Reyes JAYSON Billing, MD;  Location: [facility];  Service: Orthopedics;  Laterality: Right;   SHOULDER ARTHROSCOPY WITH  ROTATOR CUFF REPAIR Left 2001   TOTAL KNEE ARTHROPLASTY Right 05/14/2015   Procedure: REMOVAL OF HARDWARE AND RIGHT TOTAL KNEE ARTHROPLASTY;  Surgeon: Reyes Billing, MD;  Location: [facility];  Service: Orthopedics;  Laterality: Right;   ULNAR NERVE REPAIR Right 1996    Social History:  reports that he has never smoked. He has never used smokeless tobacco. He reports that he does not drink alcohol and does not use drugs.  Allergies: No Known Allergies  Medications Prior to Admission  Medication Sig Dispense Refill   aspirin  EC 81 MG tablet Take 1  tablet (81 mg total) by mouth daily. Swallow whole. 90 tablet 1   escitalopram  (LEXAPRO ) 20 MG tablet Take 1 tablet (20 mg total) by mouth daily. 90 tablet 3   gabapentin  (NEURONTIN ) 300 MG capsule Take 300 mg by mouth 2 (two) times daily.     melatonin 5 MG TABS Take 10 mg by mouth at bedtime as needed.     metFORMIN  (GLUCOPHAGE -XR) 500 MG 24 hr tablet Take 1 tablet (500 mg total) by mouth daily with breakfast. 90 tablet 3   rosuvastatin  (CRESTOR ) 10 MG tablet Take 1 tablet (10 mg total) by mouth daily. (Patient taking differently: Take 10 mg by mouth in the morning.) 90 tablet 3   tadalafil (CIALIS) 5 MG tablet Take 5 mg by mouth daily as needed for erectile dysfunction.     traMADol  (ULTRAM ) 50 MG tablet Take 50 mg by mouth 2 (two) times daily as needed.     traZODone  (DESYREL ) 100 MG tablet Take 1 tablet (100 mg total) by mouth at bedtime. 90 tablet 3   losartan  (COZAAR ) 25 MG tablet Take 1.5 tablets (37.5 mg total) by mouth daily. (Patient not taking: Reported on 02/29/2024) 45 tablet 3     Physical Exam: Blood pressure 112/74, pulse 81, temperature 98 F (36.7 C), temperature source Oral, resp. rate 20, height 5' 9 (1.753 m), weight 82.1 kg, SpO2 96%. General: pleasant, WD/WN male who is laying in bed in NAD HEENT: head is normocephalic, atraumatic.  Sclera are non-icteric.  Heart: regular, rate, and rhythm.   Lungs:  Respiratory effort nonlabored Abd:  Soft, ND, mild LLQ ttp. No masses, hernias, or organomegaly MS: no BUE edema Skin: warm and dry  Psych: A&Ox4 with an appropriate affect Neuro: normal speech, thought process intact, gait not assessed   Results for orders placed or performed during the hospital encounter of 02/28/24 (from the past 48 hours)  Basic metabolic panel     Status: Abnormal   Collection Time: 02/28/24  9:56 PM  Result Value Ref Range   Sodium 132 (L) 135 - 145 mmol/L   Potassium 3.7 3.5 - 5.1 mmol/L   Chloride 97 (L) 98 - 111 mmol/L   CO2 19 (L) 22 -  32 mmol/L   Glucose, Bld 120 (H) 70 - 99 mg/dL    Comment: Glucose reference range applies only to samples taken after fasting for at least 8 hours.   BUN 24 (H) 8 - 23 mg/dL   Creatinine, Ser 7.20 (H) 0.61 - 1.24 mg/dL   Calcium  9.0 8.9 - 10.3 mg/dL   GFR, Estimated 24 (L) >60 mL/min    Comment: (NOTE) Calculated using the CKD-EPI Creatinine Equation (2021)    Anion gap 16 (H) 5 - 15    Comment: Performed at [facility], [address] [place], [place] [address]  CBC     Status: Abnormal   Collection Time: 02/28/24  9:56 PM  Result Value Ref Range   WBC 14.2 (H) 4.0 - 10.5 K/uL   RBC 5.40 4.22 - 5.81 MIL/uL   Hemoglobin 15.8 13.0 - 17.0 g/dL   HCT 53.2 60.9 - 47.9 %   MCV 86.5 80.0 - 100.0 fL   MCH 29.3 26.0 - 34.0 pg   MCHC 33.8 30.0 - 36.0 g/dL   RDW 86.6 88.4 - 84.4 %   Platelets 212 150 - 400 K/uL   nRBC 0.0 0.0 - 0.2 %    Comment: Performed at [facility], [address] [place], [place] [address]  Troponin T, High Sensitivity     Status: None   Collection Time: 02/28/24  9:56 PM  Result Value Ref Range   Troponin T High Sensitivity <15 0 - 19 ng/L    Comment: (NOTE) Biotin concentrations > 1000 ng/mL falsely decrease TnT results.  Serial cardiac troponin measurements are suggested.  Refer to the Links section for chest pain algorithms and additional  guidance. Performed at [facility], [address] [place], [place] [address]   Hepatic function panel     Status: Abnormal   Collection Time: 02/28/24 10:28 PM  Result Value Ref Range   Total Protein 7.5 6.5 - 8.1 g/dL   Albumin 4.1 3.5 - 5.0 g/dL   AST 22 15 - 41 U/L   ALT 12 0 - 44 U/L   Alkaline Phosphatase 78 38 - 126 U/L   Total Bilirubin 0.9 0.0 - 1.2 mg/dL   Bilirubin, Direct 0.4 (H) 0.0 - 0.2 mg/dL   Indirect Bilirubin 0.5 0.3 - 0.9 mg/dL    Comment: Performed at [facility], [address], [place], [place] [address]   Blood culture (routine x 2)     Status: None (Preliminary result)   Collection Time: 02/28/24 10:28 PM   Specimen: BLOOD RIGHT FOREARM  Result Value Ref Range   Specimen Description      BLOOD RIGHT FOREARM Performed at [facility], [address] [place], [place] [address]    Special Requests      BOTTLES DRAWN AEROBIC AND ANAEROBIC Blood Culture adequate volume Performed at [facility], [address], [place], [place] [address]    Culture      NO GROWTH < 24 HOURS Performed at [facility], [address], [place], [place] [address]    Report Status PENDING   Blood culture (routine x 2)     Status: None (Preliminary result)   Collection Time: 02/28/24 10:33 PM   Specimen: BLOOD RIGHT FOREARM  Result Value Ref Range   Specimen Description      BLOOD RIGHT FOREARM Performed at [facility], [address] [place], [place] [address]    Special Requests      BOTTLES DRAWN AEROBIC AND ANAEROBIC Blood Culture adequate volume Performed at [facility], [address], [place], [place] [address]    Culture      NO GROWTH < 24 HOURS Performed at [facility], [address], [place], [place] [address]    Report Status PENDING   Troponin T, High Sensitivity     Status: None   Collection Time: 02/29/24 12:43 AM  Result Value Ref Range   Troponin T High Sensitivity <15 0 - 19 ng/L    Comment: (NOTE) Biotin concentrations > 1000 ng/mL falsely decrease TnT results.  Serial cardiac troponin measurements are suggested.  Refer to the Links section for  chest pain algorithms and additional  guidance. Performed at [facility], [address] [place], [place] [address]   Lactic acid, plasma     Status: None   Collection Time: 02/29/24 12:43 AM  Result Value Ref Range   Lactic Acid, Venous 1.4 0.5 - 1.9 mmol/L    Comment: Performed at [facility],  [address] [place], [place] [address]  Urinalysis, Routine w reflex microscopic -Urine, Clean Catch     Status: None   Collection Time: 02/29/24  3:24 AM  Result Value Ref Range   Color, Urine YELLOW YELLOW   APPearance CLEAR CLEAR   Specific Gravity, Urine 1.012 1.005 - 1.030   pH 5.0 5.0 - 8.0   Glucose, UA NEGATIVE NEGATIVE mg/dL   Hgb urine dipstick NEGATIVE NEGATIVE   Bilirubin Urine NEGATIVE NEGATIVE   Ketones, ur NEGATIVE NEGATIVE mg/dL   Protein, ur NEGATIVE NEGATIVE mg/dL   Nitrite NEGATIVE NEGATIVE   Leukocytes,Ua NEGATIVE NEGATIVE    Comment: Performed at [facility], [address], [place], [place] [address]  CBC     Status: Abnormal   Collection Time: 02/29/24  2:32 PM  Result Value Ref Range   WBC 10.7 (H) 4.0 - 10.5 K/uL   RBC 5.30 4.22 - 5.81 MIL/uL   Hemoglobin 15.4 13.0 - 17.0 g/dL   HCT 52.5 60.9 - 47.9 %   MCV 89.4 80.0 - 100.0 fL   MCH 29.1 26.0 - 34.0 pg   MCHC 32.5 30.0 - 36.0 g/dL   RDW 86.7 88.4 - 84.4 %   Platelets 193 150 - 400 K/uL   nRBC 0.0 0.0 - 0.2 %    Comment: Performed at [facility], [address] [place], [place] [address]  Comprehensive metabolic panel     Status: Abnormal   Collection Time: 02/29/24  2:32 PM  Result Value Ref Range   Sodium 136 135 - 145 mmol/L   Potassium 4.4 3.5 - 5.1 mmol/L   Chloride 102 98 - 111 mmol/L   CO2 26 22 - 32 mmol/L   Glucose, Bld 99 70 - 99 mg/dL    Comment: Glucose reference range applies only to samples taken after fasting for at least 8 hours.   BUN 18 8 - 23 mg/dL   Creatinine, Ser 8.00 (H) 0.61 - 1.24 mg/dL   Calcium  8.9 8.9 - 10.3 mg/dL   Total Protein 6.7 6.5 - 8.1 g/dL   Albumin 3.7 3.5 - 5.0 g/dL   AST 23 15 - 41 U/L   ALT 15 0 - 44 U/L   Alkaline Phosphatase 64 38 - 126 U/L   Total Bilirubin 0.7 0.0 - 1.2 mg/dL   GFR, Estimated 35 (L) >60 mL/min    Comment: (NOTE) Calculated using the CKD-EPI Creatinine Equation (2021)    Anion gap 8  5 - 15    Comment: Performed at [facility], [address], [place], [place] [address]  Magnesium      Status: None   Collection Time: 02/29/24  2:32 PM  Result Value Ref Range   Magnesium  2.1 1.7 - 2.4 mg/dL    Comment: Performed at [facility], [address], [place], [place] [address]  Phosphorus     Status: None   Collection Time: 02/29/24  2:32 PM  Result Value Ref Range   Phosphorus 3.6 2.5 - 4.6 mg/dL    Comment: Performed at [facility], [address] [place],  [place] [address]  HIV Antibody (routine testing w rflx)     Status: None   Collection Time: 03/01/24  4:00 AM  Result Value Ref Range   HIV Screen 4th Generation wRfx Non Reactive Non Reactive    Comment: Performed at [facility], [address], [place], [place] [address]  CBC     Status: None   Collection Time: 03/01/24  4:00 AM  Result Value Ref Range   WBC 7.4 4.0 - 10.5 K/uL   RBC 4.85 4.22 - 5.81 MIL/uL   Hemoglobin 13.9 13.0 - 17.0 g/dL   HCT 56.8 60.9 - 47.9 %   MCV 88.9 80.0 - 100.0 fL   MCH 28.7 26.0 - 34.0 pg   MCHC 32.3 30.0 - 36.0 g/dL   RDW 87.1 88.4 - 84.4 %   Platelets 197 150 - 400 K/uL   nRBC 0.0 0.0 - 0.2 %    Comment: Performed at [facility], [address], [place], [place] [address]  Comprehensive metabolic panel     Status: Abnormal   Collection Time: 03/01/24  4:00 AM  Result Value Ref Range   Sodium 140 135 - 145 mmol/L   Potassium 4.3 3.5 - 5.1 mmol/L   Chloride 105 98 - 111 mmol/L   CO2 27 22 - 32 mmol/L   Glucose, Bld 89 70 - 99 mg/dL    Comment: Glucose reference range applies only to samples taken after fasting for at least 8 hours.   BUN 16 8 - 23 mg/dL   Creatinine, Ser 8.53 (H) 0.61 - 1.24 mg/dL   Calcium  8.9 8.9 - 10.3 mg/dL   Total Protein 6.1 (L) 6.5 - 8.1 g/dL   Albumin 3.5 3.5 - 5.0 g/dL   AST 24 15 - 41 U/L   ALT 16 0 - 44 U/L   Alkaline Phosphatase 69 38 - 126 U/L   Total  Bilirubin 0.3 0.0 - 1.2 mg/dL   GFR, Estimated 51 (L) >60 mL/min    Comment: (NOTE) Calculated using the CKD-EPI Creatinine Equation (2021)    Anion gap 8 5 - 15    Comment: Performed at [facility], [address] [place], [place] [address]   CT ABDOMEN PELVIS WO CONTRAST Result Date: 02/29/2024 EXAM: CT ABDOMEN AND PELVIS WITHOUT CONTRAST 02/29/2024 12:25:06 AM TECHNIQUE: CT of the abdomen and pelvis was performed without the administration of intravenous contrast. Multiplanar reformatted images are provided for review. Automated exposure control, iterative reconstruction, and/or weight-based adjustment of the mA/kV was utilized to reduce the radiation dose to as low as reasonably achievable. COMPARISON: 04/16/2022 CLINICAL HISTORY: Sepsis. Pt via POV with family c/o lethargy, hypotension 70s/40s, low O2 saturation at home. Recent changes to HTN med compliance. BP 78/54 in triage. FINDINGS: LOWER CHEST: Mild subpleural scarring/atelectasis in the new lingula and bilateral lower lobes. LIVER: The liver is unremarkable. GALLBLADDER AND BILE DUCTS: Layering mild gallbladder sludge versus noncalcified gallstones (image 27) without associated inflammatory changes. No biliary ductal dilatation. SPLEEN: No acute abnormality. PANCREAS: No acute abnormality. ADRENAL GLANDS: No acute abnormality. KIDNEYS, URETERS AND BLADDER: No stones in the kidneys or ureters. No hydronephrosis. No perinephric or periureteral stranding. Urinary bladder is unremarkable. GI AND BOWEL: Stomach demonstrates no acute abnormality. Left colonic diverticulosis with mild diverticulitis along the distal descending colon (image 50). There is no bowel obstruction. PERITONEUM AND RETROPERITONEUM: No ascites. No free air to suggest macroscopic perforation. No drainable fluid collection/abscess. VASCULATURE: Aorta is normal in caliber.  LYMPH NODES: No lymphadenopathy. REPRODUCTIVE ORGANS: Prostatomegaly with postsurgical  changes related to urolift procedure. BONES AND SOFT TISSUES: No acute osseous abnormality. No focal soft tissue abnormality. IMPRESSION: 1. Left colonic diverticulitis, as above. No abscess or free air. Electronically signed by: Pinkie Pebbles MD 02/29/2024 12:28 AM EDT RP Workstation: [id number]   DG Chest Port 1 View Result Date: 02/28/2024 CLINICAL DATA:  Shortness of breath EXAM: PORTABLE CHEST 1 VIEW COMPARISON:  11/25/2018 FINDINGS: The heart size and mediastinal contours are within normal limits. Both lungs are clear. The visualized skeletal structures are unremarkable. No pneumothorax. IMPRESSION: No active disease. Electronically Signed   By: Franky Crease M.D.   On: 02/28/2024 22:06    Anti-infectives (From admission, onward)    Start     Dose/Rate Route Frequency Ordered Stop   02/29/24 2200  cefTRIAXone (ROCEPHIN) 2 g in sodium chloride  0.9 % 100 mL IVPB        2 g 200 mL/hr over 30 Minutes Intravenous Every 24 hours 02/29/24 1411     02/29/24 1500  metroNIDAZOLE (FLAGYL) IVPB 500 mg        500 mg 100 mL/hr over 60 Minutes Intravenous Every 12 hours 02/29/24 1411     02/29/24 0000  metroNIDAZOLE (FLAGYL) IVPB 500 mg        500 mg 100 mL/hr over 60 Minutes Intravenous  Once 02/28/24 2352 02/29/24 0220   02/28/24 2345  cefTRIAXone (ROCEPHIN) 1 g in sodium chloride  0.9 % 100 mL IVPB        1 g 200 mL/hr over 30 Minutes Intravenous  Once 02/28/24 2343 02/29/24 0121       Assessment/Plan Recurrent Diverticulitis - No evidence of complicating features (microperforation, perforation, abscess) on CT. HDS on last vital check without fever, tachycardia or hypotension. WBC wnl. Abdomen soft. He is on a regular diet. No indication for emergency surgery. General surgery will sign off.  - Regarding recurrent flares of Diverticulitis, will arrange follow up in the office to discuss possible elective colectomy. Discussed with Colorectal surgery. Would ask for him to see GI in follow up as  well for flex sig vs colonoscopy before his appointment.   I reviewed nursing notes, hospitalist notes, last 24 h vitals and pain scores, last 48 h intake and output, last 24 h labs and trends, and last 24 h imaging results.   Ozell CHRISTELLA Shaper, [facility] 03/01/2024, 2:07 PM Please see Amion for pager number during day hours 7:00am-4:30pm

## 2024-03-02 DIAGNOSIS — K5792 Diverticulitis of intestine, part unspecified, without perforation or abscess without bleeding: Secondary | ICD-10-CM | POA: Diagnosis not present

## 2024-03-02 MED ORDER — OXYCODONE HCL 5 MG PO TABS: 5.0000 mg | ORAL_TABLET | ORAL | 0 refills | Status: AC | PRN

## 2024-03-02 MED ORDER — OXYCODONE HCL 5 MG PO TABS: 5.0000 mg | ORAL_TABLET | ORAL | 0 refills | Status: DC | PRN

## 2024-03-02 MED ORDER — CIPROFLOXACIN HCL 500 MG PO TABS: 500.0000 mg | ORAL_TABLET | Freq: Two times a day (BID) | ORAL | 0 refills | Status: AC

## 2024-03-02 MED ORDER — METRONIDAZOLE 500 MG PO TABS: 500.0000 mg | ORAL_TABLET | Freq: Three times a day (TID) | ORAL | 0 refills | Status: AC

## 2024-03-02 MED ORDER — TRAMADOL HCL 50 MG PO TABS: 50.0000 mg | ORAL_TABLET | Freq: Two times a day (BID) | ORAL | 0 refills | Status: AC | PRN

## 2024-03-02 NOTE — Discharge Summary (Signed)
 Physician Discharge Summary  Salih Rivere FMW:[id number] DOB: [birth year] DOA: 02/28/2024  PCP: Knute Thersia Bitters, FNP  Admit date: 02/28/2024 Discharge date: 03/02/2024  Admitted From: Home Disposition: Home  Recommendations for Outpatient Follow-up:  Follow up with PCP in 1-2 weeks Follow-up with general surgery as scheduled  Home Health: None Equipment/Devices: None  Discharge Condition: Stable CODE STATUS: Full Diet recommendation: Low-salt low-fat diet  Brief/Interim Summary: Noriel Cedotal is a 73 y.o. 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  Hospitalist called for admission.  Patient admitted as above with acute recurrent diverticulitis, fortunately patient resolved quite quickly over the past 24 hours with supportive care and antibiotics.  Given patient's recurrent episodes discussion was had with general surgery for possible evaluation for elective hemicolectomy.  At this time given patient's resolution of symptoms approaching baseline he is otherwise stable and agreeable for discharge home -continue antibiotic course as below.  Plan for follow-up with PCP as well as surgery for further discussion in regards to ongoing treatment of diverticulosis.  Discharge Diagnoses:  Principal Problem:   Acute diverticulitis Active Problems:   Hemiplegia affecting nondominant side (HCC)   Essential hypertension   Stage 3b chronic kidney disease (HCC)   Diverticulosis   Hyperlipidemia   Depression, recurrent   Prediabetes   Lumbosacral spondylosis with radiculopathy   AKI (acute kidney  injury)   Hyponatremia   Metabolic acidosis   Hypotension  Sepsis secondary to acute diverticulitis, recurrent Transient hypotension without shock -Patient and family report multiple episodes of diverticulitis over the past few years becoming more frequent, last noted episode just 3 months ago  - Sepsis criteria met with leukocytosis, tachypnea and notable source - CT abdomen pelvis confirms left colonic diverticulitis without abscess or free air - Given recurrent episodes General Surgery was sidelined, recommending outpatient follow-up for further discussion on elective hemicolectomy -there is no indication for urgent or emergent procedure at this time - Transition to p.o. Cipro/Flagyl at discharge - Tolerating p.o. well   Anion gap metabolic acidosis Secondary to AKI on stage 3b chronic kidney disease - Lactic acid within normal limits, gap closed with IV fluids and supportive care, most recent bicarb 27   Hypotension, resolved -Transient secondary to above, resolving with fluids  - Resume home medications at discharge   Lumbosacral spondylosis with radiculopathy Chronic, status post recent injection, currently well-controlled   Hemiplegia affecting nondominant side (HCC) Supportive care. Essential hypertension hold losartan  -resume in 24 to 48 hours if indicated Hyperlipidemia Continue rosuvastatin  10 mg p.o. daily. Depression, recurrent Continue escitalopram , Trazodone  Prediabetes -prior A1c 5.9, diet controlled Hypovolemic  -resolved, not likely clinically relevant at this time   Discharge Instructions  Discharge Instructions     Call MD for:  difficulty breathing, headache or visual disturbances   Complete by: As directed    Call MD for:  extreme fatigue   Complete by: As directed    Call MD for:  hives   Complete by: As directed    Call MD for:  persistant dizziness or light-headedness   Complete by: As directed    Call MD for:  persistant nausea and vomiting    Complete by: As directed    Call MD for:  severe uncontrolled pain   Complete  by: As directed    Call MD for:  temperature >100.4   Complete by: As directed    Diet - low sodium heart healthy   Complete by: As directed    Discharge instructions   Complete by: As directed    Follow up with general surgery for further discussion on elective hemi-colectomy per previous discussion. Return to hospital if worsening abdominal pain, noted bleeding, or worsening fevers/chills.   Increase activity slowly   Complete by: As directed       Allergies as of 03/02/2024   No Known Allergies      Medication List     STOP taking these medications    losartan  25 MG tablet Commonly known as: COZAAR        TAKE these medications    aspirin  EC 81 MG tablet Take 1 tablet (81 mg total) by mouth daily. Swallow whole.   ciprofloxacin 500 MG tablet Commonly known as: Cipro Take 1 tablet (500 mg total) by mouth 2 (two) times daily for 10 days.   escitalopram  20 MG tablet Commonly known as: LEXAPRO  Take 1 tablet (20 mg total) by mouth daily.   gabapentin  300 MG capsule Commonly known as: NEURONTIN  Take 300 mg by mouth 2 (two) times daily.   melatonin 5 MG Tabs Take 10 mg by mouth at bedtime as needed.   metFORMIN  500 MG 24 hr tablet Commonly known as: GLUCOPHAGE -XR Take 1 tablet (500 mg total) by mouth daily with breakfast.   metroNIDAZOLE 500 MG tablet Commonly known as: FLAGYL Take 1 tablet (500 mg total) by mouth 3 (three) times daily for 10 days.   oxyCODONE  5 MG immediate release tablet Commonly known as: Oxy IR/ROXICODONE  Take 1 tablet (5 mg total) by mouth every 4 (four) hours as needed for up to 5 days for severe pain (pain score 7-10) or moderate pain (pain score 4-6).   rosuvastatin  10 MG tablet Commonly known as: CRESTOR  Take 1 tablet (10 mg total) by mouth daily. What changed: when to take this   tadalafil 5 MG tablet Commonly known as: CIALIS Take 5 mg by mouth  daily as needed for erectile dysfunction.   traMADol  50 MG tablet Commonly known as: ULTRAM  Take 1 tablet (50 mg total) by mouth 2 (two) times daily as needed. Resume after completing or once no longer needing oxycodone . Start taking on: March 08, 2024 What changed:  additional instructions These instructions start on March 08, 2024. If you are unsure what to do until then, ask your doctor or other care provider.   traZODone  100 MG tablet Commonly known as: DESYREL  Take 1 tablet (100 mg total) by mouth at bedtime.        Follow-up Information     Saintclair Jasper, MD Follow up.   Specialty: Gastroenterology Why: For repeat Colonoscopy Contact information: [address] [place] [address] [phone]         Sheldon Standing, MD Follow up on 03/18/2024.   Specialties: General Surgery, Colon and Rectal Surgery Why: 1:45pm. To discuss surgery. Please bring a copy of your photo ID, insurance card and arrive 30 minutes prior to your appointment for paperwork. Contact information: [address] [place] [address] [phone]                No Known Allergies  Consultations: General Surgery  Procedures/Studies: CT ABDOMEN PELVIS WO CONTRAST Result Date: 02/29/2024 EXAM: CT ABDOMEN AND PELVIS WITHOUT CONTRAST 02/29/2024 12:25:06 AM TECHNIQUE: CT  of the abdomen and pelvis was performed without the administration of intravenous contrast. Multiplanar reformatted images are provided for review. Automated exposure control, iterative reconstruction, and/or weight-based adjustment of the mA/kV was utilized to reduce the radiation dose to as low as reasonably achievable. COMPARISON: 04/16/2022 CLINICAL HISTORY: Sepsis. Pt via POV with family c/o lethargy, hypotension 70s/40s, low O2 saturation at home. Recent changes to HTN med compliance. BP 78/54 in triage. FINDINGS: LOWER CHEST: Mild subpleural scarring/atelectasis in the new lingula and bilateral  lower lobes. LIVER: The liver is unremarkable. GALLBLADDER AND BILE DUCTS: Layering mild gallbladder sludge versus noncalcified gallstones (image 27) without associated inflammatory changes. No biliary ductal dilatation. SPLEEN: No acute abnormality. PANCREAS: No acute abnormality. ADRENAL GLANDS: No acute abnormality. KIDNEYS, URETERS AND BLADDER: No stones in the kidneys or ureters. No hydronephrosis. No perinephric or periureteral stranding. Urinary bladder is unremarkable. GI AND BOWEL: Stomach demonstrates no acute abnormality. Left colonic diverticulosis with mild diverticulitis along the distal descending colon (image 50). There is no bowel obstruction. PERITONEUM AND RETROPERITONEUM: No ascites. No free air to suggest macroscopic perforation. No drainable fluid collection/abscess. VASCULATURE: Aorta is normal in caliber. LYMPH NODES: No lymphadenopathy. REPRODUCTIVE ORGANS: Prostatomegaly with postsurgical changes related to urolift procedure. BONES AND SOFT TISSUES: No acute osseous abnormality. No focal soft tissue abnormality. IMPRESSION: 1. Left colonic diverticulitis, as above. No abscess or free air. Electronically signed by: Pinkie Pebbles MD 02/29/2024 12:28 AM EDT RP Workstation: [id number]   DG Chest Port 1 View Result Date: 02/28/2024 CLINICAL DATA:  Shortness of breath EXAM: PORTABLE CHEST 1 VIEW COMPARISON:  11/25/2018 FINDINGS: The heart size and mediastinal contours are within normal limits. Both lungs are clear. The visualized skeletal structures are unremarkable. No pneumothorax. IMPRESSION: No active disease. Electronically Signed   By: Franky Crease M.D.   On: 02/28/2024 22:06     Subjective: No acute issues or events overnight, pain currently well-controlled tolerating p.o. well multiple bowel movements without any constipation or diarrhea.  Otherwise denies headache fever chills chest pain shortness of breath nausea or vomiting   Discharge Exam: Vitals:   03/01/24 2143  03/02/24 0608  BP: (!) 143/89 117/75  Pulse: 66 76  Resp: 18 18  Temp: 98.3 F (36.8 C) 98.7 F (37.1 C)  SpO2: 98% 94%   Vitals:   03/01/24 1038 03/01/24 1301 03/01/24 2143 03/02/24 0608  BP: 114/76 112/74 (!) 143/89 117/75  Pulse: 71 81 66 76  Resp: 20 20 18 18   Temp: 98.4 F (36.9 C) 98 F (36.7 C) 98.3 F (36.8 C) 98.7 F (37.1 C)  TempSrc: Oral Oral Oral Oral  SpO2: 96% 96% 98% 94%  Weight:      Height:        General: Pt is alert, awake, not in acute distress Cardiovascular: RRR, S1/S2 +, no rubs, no gallops Respiratory: CTA bilaterally, no wheezing, no rhonchi Abdominal: Soft, NT, ND, bowel sounds + Extremities: no edema, no cyanosis    The results of significant diagnostics from this hospitalization (including imaging, microbiology, ancillary and laboratory) are listed below for reference.     Microbiology: Recent Results (from the past 240 hours)  Blood culture (routine x 2)     Status: None (Preliminary result)   Collection Time: 02/28/24 10:28 PM   Specimen: BLOOD RIGHT FOREARM  Result Value Ref Range Status   Specimen Description   Final    BLOOD RIGHT FOREARM Performed at [facility], [address] [place], [place] [address]  Special Requests   Final    BOTTLES DRAWN AEROBIC AND ANAEROBIC Blood Culture adequate volume Performed at [facility], [address] [place], [place] [address]    Culture   Final    NO GROWTH 2 DAYS Performed at [facility], [address] [place], [place] [address]    Report Status PENDING  Incomplete  Blood culture (routine x 2)     Status: None (Preliminary result)   Collection Time: 02/28/24 10:33 PM   Specimen: BLOOD RIGHT FOREARM  Result Value Ref Range Status   Specimen Description   Final    BLOOD RIGHT FOREARM Performed at [facility], [address] [place], [place] [address]    Special Requests   Final    BOTTLES DRAWN AEROBIC AND  ANAEROBIC Blood Culture adequate volume Performed at [facility], [address] [place], [place] [address]    Culture   Final    NO GROWTH 2 DAYS Performed at [facility], [address], [place], [place] [address]    Report Status PENDING  Incomplete     Labs: BNP (last 3 results) No results for input(s): BNP in the last 8760 hours. Basic Metabolic Panel: Recent Labs  Lab 02/28/24 2156 02/29/24 1432 03/01/24 0400  NA 132* 136 140  K 3.7 4.4 4.3  CL 97* 102 105  CO2 19* 26 27  GLUCOSE 120* 99 89  BUN 24* 18 16  CREATININE 2.79* 1.99* 1.46*  CALCIUM  9.0 8.9 8.9  MG  --  2.1  --   PHOS  --  3.6  --    Liver Function Tests: Recent Labs  Lab 02/28/24 2228 02/29/24 1432 03/01/24 0400  AST 22 23 24   ALT 12 15 16   ALKPHOS 78 64 69  BILITOT 0.9 0.7 0.3  PROT 7.5 6.7 6.1*  ALBUMIN 4.1 3.7 3.5   No results for input(s): LIPASE, AMYLASE in the last 168 hours. No results for input(s): AMMONIA in the last 168 hours. CBC: Recent Labs  Lab 02/28/24 2156 02/29/24 1432 03/01/24 0400  WBC 14.2* 10.7* 7.4  HGB 15.8 15.4 13.9  HCT 46.7 47.4 43.1  MCV 86.5 89.4 88.9  PLT 212 193 197   Cardiac Enzymes: No results for input(s): CKTOTAL, CKMB, CKMBINDEX, TROPONINI in the last 168 hours. BNP: Invalid input(s): POCBNP CBG: No results for input(s): GLUCAP in the last 168 hours. D-Dimer No results for input(s): DDIMER in the last 72 hours. Hgb A1c No results for input(s): HGBA1C in the last 72 hours. Lipid Profile No results for input(s): CHOL, HDL, LDLCALC, TRIG, CHOLHDL, LDLDIRECT in the last 72 hours. Thyroid function studies No results for input(s): TSH, T4TOTAL, T3FREE, THYROIDAB in the last 72 hours.  Invalid input(s): FREET3 Anemia work up No results for input(s): VITAMINB12, FOLATE, FERRITIN, TIBC, IRON, RETICCTPCT in the last 72 hours. Urinalysis    Component Value  Date/Time   COLORURINE YELLOW 02/29/2024 0324   APPEARANCEUR CLEAR 02/29/2024 0324   LABSPEC 1.012 02/29/2024 0324   PHURINE 5.0 02/29/2024 0324   GLUCOSEU NEGATIVE 02/29/2024 0324   HGBUR NEGATIVE 02/29/2024 0324   BILIRUBINUR NEGATIVE 02/29/2024 0324   KETONESUR NEGATIVE 02/29/2024 0324   PROTEINUR NEGATIVE 02/29/2024 0324   UROBILINOGEN 0.2 06/13/2013 0655   NITRITE NEGATIVE 02/29/2024 0324   LEUKOCYTESUR NEGATIVE 02/29/2024 0324   Sepsis Labs Recent Labs  Lab 02/28/24 2156 02/29/24 1432 03/01/24 0400  WBC 14.2* 10.7* 7.4   Microbiology Recent Results (from the  past 240 hours)  Blood culture (routine x 2)     Status: None (Preliminary result)   Collection Time: 02/28/24 10:28 PM   Specimen: BLOOD RIGHT FOREARM  Result Value Ref Range Status   Specimen Description   Final    BLOOD RIGHT FOREARM Performed at [facility], [address], [place], [place] [address]    Special Requests   Final    BOTTLES DRAWN AEROBIC AND ANAEROBIC Blood Culture adequate volume Performed at [facility], [address], [place], [place] [address]    Culture   Final    NO GROWTH 2 DAYS Performed at [facility], [address], [place], [place] [address]    Report Status PENDING  Incomplete  Blood culture (routine x 2)     Status: None (Preliminary result)   Collection Time: 02/28/24 10:33 PM   Specimen: BLOOD RIGHT FOREARM  Result Value Ref Range Status   Specimen Description   Final    BLOOD RIGHT FOREARM Performed at [facility], [address], [place], [place] [address]    Special Requests   Final    BOTTLES DRAWN AEROBIC AND ANAEROBIC Blood Culture adequate volume Performed at [facility], [address], [place], [place] [address]    Culture   Final    NO GROWTH 2 DAYS Performed at [facility], [address], [place], [place] [address]    Report Status PENDING  Incomplete      Time coordinating discharge: Over 30 minutes  SIGNED:   Elsie JAYSON Montclair, DO [facility] 03/02/2024, 5:34 PM Pager   If 7PM-7AM, please contact night-coverage [email or web address]

## 2024-03-05 LAB — CULTURE, BLOOD (ROUTINE X 2)
Culture: NO GROWTH
Culture: NO GROWTH
Special Requests: ADEQUATE
Special Requests: ADEQUATE

## 2024-03-07 ENCOUNTER — Ambulatory Visit (HOSPITAL_BASED_OUTPATIENT_CLINIC_OR_DEPARTMENT_OTHER): Admitting: Family Medicine

## 2024-03-07 ENCOUNTER — Encounter (HOSPITAL_BASED_OUTPATIENT_CLINIC_OR_DEPARTMENT_OTHER): Payer: Self-pay | Admitting: Family Medicine

## 2024-03-07 VITALS — BP 117/77 | HR 74 | Ht 69.0 in | Wt 184.6 lb

## 2024-03-07 DIAGNOSIS — I10 Essential (primary) hypertension: Secondary | ICD-10-CM | POA: Diagnosis not present

## 2024-03-07 DIAGNOSIS — R400 Somnolence: Secondary | ICD-10-CM

## 2024-03-07 DIAGNOSIS — N1832 Chronic kidney disease, stage 3b: Secondary | ICD-10-CM

## 2024-03-07 DIAGNOSIS — K5790 Diverticulosis of intestine, part unspecified, without perforation or abscess without bleeding: Secondary | ICD-10-CM | POA: Diagnosis not present

## 2024-03-07 DIAGNOSIS — Z09 Encounter for follow-up examination after completed treatment for conditions other than malignant neoplasm: Secondary | ICD-10-CM

## 2024-03-07 NOTE — Progress Notes (Signed)
 "   New Patient Office Visit  Subjective:   Nicholas Cooke 12/22/1950 03/07/2024  Chief Complaint  Patient presents with   Hospitalization Follow-up    Patient was recently hospitalized 10/15 and is here following up from stay. States only concern is that he does not have his strength back yet and also feels fatigued.    Discussed the use of AI scribe software for clinical note transcription with the patient, who gave verbal consent to proceed.  History of Present Illness Nicholas Cooke is a 73 year old male with chronic kidney disease and diverticulosis who presents for a hospital follow-up visit. He is accompanied by his wife, Nicholas Cooke, who joined the visit via phone.  He is here for a follow-up visit after a recent hospital discharge due to complications related to diverticulosis, which led to fluid loss and kidney injury. He is currently completing a course of antibiotics with a few days remaining and feels 'okay' since returning home. His bowel movements remain liquid, with approximately two episodes per day since his hospital discharge. He has an appt week with General Surgery to discuss possible elective colectomy.   During his hospital stay, losartan  was temporarily discontinued due to fluid loss and kidney concerns. He has since restarted losartan , taking one and a half tablets daily, and his blood pressure, which was previously high, has now stabilized. He was previously switched from lisinopril  to losartan  due to a cough associated with lisinopril .  He experiences chronic fatigue and low energy, feeling easily tired and nostalgic. No headaches, but he wakes up gasping and coughing at night. His wife notes that he sleeps in separate rooms, and she occasionally hears sounds that could indicate sleep disturbances. He is not interested in pursuing a sleep study at this time.  He received his flu shot at [facility] prior to this visit.     Latest Ref Rng & Units 03/01/2024    4:00 AM  02/29/2024    2:32 PM 02/28/2024    9:56 PM  BMP  Glucose 70 - 99 mg/dL 89  99  879   BUN 8 - 23 mg/dL 16  18  24    Creatinine 0.61 - 1.24 mg/dL 8.53  8.00  7.20   Sodium 135 - 145 mmol/L 140  136  132   Potassium 3.5 - 5.1 mmol/L 4.3  4.4  3.7   Chloride 98 - 111 mmol/L 105  102  97   CO2 22 - 32 mmol/L 27  26  19    Calcium  8.9 - 10.3 mg/dL 8.9  8.9  9.0    Lab Results  Component Value Date   WBC 7.4 03/01/2024   HGB 13.9 03/01/2024   HCT 43.1 03/01/2024   MCV 88.9 03/01/2024   PLT 197 03/01/2024     The following portions of the patient's history were reviewed and updated as appropriate: past medical history, past surgical history, family history, social history, allergies, medications, and problem list.   Patient Active Problem List   Diagnosis Date Noted   Uncontrolled daytime somnolence 03/08/2024   Acute diverticulitis 02/29/2024   Lumbar radiculopathy 02/29/2024   AKI (acute kidney injury) 02/29/2024   Hyponatremia 02/29/2024   Metabolic acidosis 02/29/2024   Hypotension 02/29/2024   Lumbosacral spondylosis with radiculopathy 06/30/2023   Other chronic pain 06/30/2023   Lumbosacral spondylosis without myelopathy 06/30/2023   Lymphocytosis 06/27/2023   Pre-syncope 06/27/2023   Prediabetes 05/19/2023   Acute bilateral low back pain without sciatica 11/24/2022   Benign prostatic  hyperplasia without lower urinary tract symptoms 05/17/2022   Stage 3b chronic kidney disease (HCC) 05/17/2022   Diverticulosis 05/17/2022   Hyperlipidemia 05/17/2022   Depression, recurrent 05/17/2022   Amaurosis fugax 03/12/2021   History of diverticulitis 01/29/2021   Insomnia 12/28/2020   Essential hypertension 06/15/2020   Bilateral impacted cerumen 03/15/2018   Rhinitis 03/15/2018   Tinnitus aurium, bilateral 03/15/2018   Osteoarthritis of right knee 05/14/2015   Right knee DJD 05/14/2015   Hemiplegia affecting nondominant side (HCC) 06/13/2013   Left inguinal hernia  01/24/2012   Past Medical History:  Diagnosis Date   Chronic kidney disease    Dental crowns present    Depression    Enlarged prostate    Hypertension    states under control with med., has been on med. x 2 yr.   Inguinal hernia 12/2015   Osteoarthritis    Past Surgical History:  Procedure Laterality Date   APPENDECTOMY  yrs ago   CYSTOSCOPY WITH INSERTION OF UROLIFT N/A 07/03/2018   Procedure: CYSTOSCOPY WITH INSERTION OF UROLIFT;  Surgeon: Nieves Cough, MD;  Location: [facility];  Service: Urology;  Laterality: N/A;  ONLY NEEDS 30 MIN FOR PROCEDURE   HERNIA REPAIR  2014   INGUINAL HERNIA REPAIR  02/16/2012   Procedure: HERNIA REPAIR INGUINAL ADULT;  Surgeon: Vicenta DELENA Poli, MD;  Location: [facility];  Service: General;  Laterality: Left;  left inguinal hernia repair with mesh   INGUINAL HERNIA REPAIR Bilateral 12/23/2015   Procedure: LAPAROSCOPIC LEFT INGUINAL HERNIA WITH MESH;  Surgeon: Vicenta Poli, MD;  Location: [facility];  Service: General;  Laterality: Bilateral;   INSERTION OF MESH Bilateral 12/23/2015   Procedure: INSERTION OF MESH;  Surgeon: Vicenta Poli, MD;  Location: [facility];  Service: General;  Laterality: Bilateral;   JOINT REPLACEMENT     KNEE ARTHROSCOPY WITH MEDIAL MENISECTOMY Right 10/18/2013   Procedure: RIGHT KNEE ARTHROSCOPY WITH PARTIAL MEDIAL MENISECTOMY;  Surgeon: Reyes JAYSON Billing, MD;  Location: [facility];  Service: Orthopedics;  Laterality: Right;   SHOULDER ARTHROSCOPY WITH ROTATOR CUFF REPAIR Left 2001   TOTAL KNEE ARTHROPLASTY Right 05/14/2015   Procedure: REMOVAL OF HARDWARE AND RIGHT TOTAL KNEE ARTHROPLASTY;  Surgeon: Reyes Billing, MD;  Location: [facility];  Service: Orthopedics;  Laterality: Right;   ULNAR NERVE REPAIR Right 1996   Family History  Problem Relation Age of Onset   Arthritis Mother    Stroke Father    Early death Sister    Social History   Socioeconomic  History   Marital status: Married    Spouse name: Not on file   Number of children: Not on file   Years of education: Not on file   Highest education level: 12th grade  Occupational History   Occupation: retired  Tobacco Use   Smoking status: Never   Smokeless tobacco: Never  Vaping Use   Vaping status: Never Used  Substance and Sexual Activity   Alcohol use: No   Drug use: No   Sexual activity: Not Currently    Birth control/protection: None  Other Topics Concern   Not on file  Social History Narrative   Right handed   Drinks caffeine   Two story home   Married   Retired from working for [employer]    Social Drivers of [employer]: Low Risk  (01/02/2024)   Overall Financial Resource Strain (CARDIA)    Difficulty of Paying Living Expenses:  Not hard at all  Food Insecurity: No Food Insecurity (02/29/2024)   Hunger Vital Sign    Worried About Running Out of Food in the Last Year: Never true    Ran Out of Food in the Last Year: Never true  Transportation Needs: No Transportation Needs (02/29/2024)   PRAPARE - Administrator, Civil Service (Medical): No    Lack of Transportation (Non-Medical): No  Physical Activity: Inactive (01/02/2024)   Exercise Vital Sign    Days of Exercise per Week: 0 days    Minutes of Exercise per Session: Not on file  Stress: No Stress Concern Present (01/02/2024)   [employer] of Occupational Health - Occupational Stress Questionnaire    Feeling of Stress: Not at all  Social Connections: Moderately Integrated (02/29/2024)   Social Connection and Isolation Panel    Frequency of Communication with Friends and Family: More than three times a week    Frequency of Social Gatherings with Friends and Family: Three times a week    Attends Religious Services: Never    Active Member of Clubs or Organizations: No    Attends Banker Meetings: 1 to 4 times per year    Marital Status:  Married  Recent Concern: Social Connections - Moderately Isolated (01/02/2024)   Social Connection and Isolation Panel    Frequency of Communication with Friends and Family: More than three times a week    Frequency of Social Gatherings with Friends and Family: Never    Attends Religious Services: Never    Database Administrator or Organizations: No    Attends Engineer, Structural: Not on file    Marital Status: Married  Catering Manager Violence: Not At Risk (02/29/2024)   Humiliation, Afraid, Rape, and Kick questionnaire    Fear of Current or Ex-Partner: No    Emotionally Abused: No    Physically Abused: No    Sexually Abused: No   Outpatient Medications Prior to Visit  Medication Sig Dispense Refill   aspirin  EC 81 MG tablet Take 1 tablet (81 mg total) by mouth daily. Swallow whole. 90 tablet 1   ciprofloxacin  (CIPRO ) 500 MG tablet Take 1 tablet (500 mg total) by mouth 2 (two) times daily for 10 days. 20 tablet 0   escitalopram  (LEXAPRO ) 20 MG tablet Take 1 tablet (20 mg total) by mouth daily. 90 tablet 3   gabapentin  (NEURONTIN ) 300 MG capsule Take 300 mg by mouth 2 (two) times daily.     melatonin 5 MG TABS Take 10 mg by mouth at bedtime as needed.     metFORMIN  (GLUCOPHAGE -XR) 500 MG 24 hr tablet Take 1 tablet (500 mg total) by mouth daily with breakfast. 90 tablet 3   rosuvastatin  (CRESTOR ) 10 MG tablet Take 1 tablet (10 mg total) by mouth daily. (Patient taking differently: Take 10 mg by mouth in the morning.) 90 tablet 3   tadalafil (CIALIS) 5 MG tablet Take 5 mg by mouth daily as needed for erectile dysfunction. (Patient taking differently: Take 5 mg by mouth daily.)     traMADol  (ULTRAM ) 50 MG tablet Take 1 tablet (50 mg total) by mouth 2 (two) times daily as needed. Resume after completing or once no longer needing oxycodone . 30 tablet 0   traZODone  (DESYREL ) 100 MG tablet Take 1 tablet (100 mg total) by mouth at bedtime. 90 tablet 3   metroNIDAZOLE  (FLAGYL ) 500 MG  tablet Take 1 tablet (500 mg total) by mouth 3 (three) times daily  for 10 days. 30 tablet 0   No facility-administered medications prior to visit.   No Known Allergies  ROS: A complete ROS was performed with pertinent positives/negatives noted in the HPI. The remainder of the ROS are negative.   Objective:   Today's Vitals   03/07/24 1540  BP: 117/77  Pulse: 74  SpO2: 96%  Weight: 184 lb 9.6 oz (83.7 kg)  Height: 5' 9 (1.753 m)    GENERAL: Well-appearing, in NAD. Well nourished.  SKIN: Pink, warm and dry.  Head: Normocephalic. NECK: Trachea midline. Full ROM w/o pain or tenderness. RESPIRATORY: Chest wall symmetrical. Respirations even and non-labored. EXTREMITIES: Without clubbing, cyanosis, or edema.  NEUROLOGIC: No motor or sensory deficits. Steady, even gait. C2-C12 intact.  PSYCH/MENTAL STATUS: Alert, oriented x 3. Cooperative, appropriate mood and affect.      Assessment & Plan:  1. Hospital discharge follow-up (Primary) 2. Diverticulosis Discussed patient's course of admission and reviewed lab work at time of discharge. Pt will complete course of abx prescirbed at discharge. Patient states he no longer is pursuing consult with Gen Surg for elective colectomy.     3. Stage 3b chronic kidney disease (HCC) Stable. Creatinine, GFR at baseline at time of discharge. Pt to continue with adequate hydration, renal diet and Losartan .   4. Essential hypertension Controlled currently. Continue current regimen. Discussed MOA of Losartan  versus Amlodipine  per patient questions. Will continue on Losartan  currently.   5. Uncontrolled daytime somnolence PCP recommend patient proceed with sleep evaluation. Patient declined and verbalized understanding of risk.     Patient to reach out to office if new, worrisome, or unresolved symptoms arise or if no improvement in patient's condition. Patient verbalized understanding and is agreeable to treatment plan. All questions answered to  patient's satisfaction.    Return in about 3 months (around 06/07/2024) for Follow up CKD, HTN .    Thersia Schuyler Stark, FNP "

## 2024-03-08 DIAGNOSIS — R400 Somnolence: Secondary | ICD-10-CM | POA: Insufficient documentation

## 2024-03-08 MED ORDER — LOSARTAN POTASSIUM 25 MG PO TABS: 37.5000 mg | ORAL_TABLET | Freq: Every day | ORAL | Status: DC

## 2024-03-12 ENCOUNTER — Ambulatory Visit (INDEPENDENT_AMBULATORY_CARE_PROVIDER_SITE_OTHER): Admitting: *Deleted

## 2024-03-12 ENCOUNTER — Encounter (HOSPITAL_BASED_OUTPATIENT_CLINIC_OR_DEPARTMENT_OTHER): Payer: Self-pay

## 2024-03-12 VITALS — Ht 69.0 in | Wt 184.0 lb

## 2024-03-12 DIAGNOSIS — Z0000 Encounter for general adult medical examination without abnormal findings: Secondary | ICD-10-CM

## 2024-03-12 NOTE — Patient Instructions (Signed)
 [name] , Thank you for taking time to come for your Medicare Wellness Visit. I appreciate your ongoing commitment to your health goals. Please review the following plan we discussed and let me know if I can assist you in the future.   Screening recommendations/referrals: Colonoscopy:  Recommended yearly ophthalmology/optometry visit for glaucoma screening and checkup Recommended yearly dental visit for hygiene and checkup  Vaccinations: Influenza vaccine:  Pneumococcal vaccine:  Tdap vaccine:  Shingles vaccine:        Preventive Care 65 Years and Older, Male Preventive care refers to lifestyle choices and visits with your health care provider that can promote health and wellness. What does preventive care include? A yearly physical exam. This is also called an annual well check. Dental exams once or twice a year. Routine eye exams. Ask your health care provider how often you should have your eyes checked. Personal lifestyle choices, including: Daily care of your teeth and gums. Regular physical activity. Eating a healthy diet. Avoiding tobacco and drug use. Limiting alcohol use. Practicing safe sex. Taking low doses of aspirin  every day. Taking vitamin and mineral supplements as recommended by your health care provider. What happens during an annual well check? The services and screenings done by your health care provider during your annual well check will depend on your age, overall health, lifestyle risk factors, and family history of disease. Counseling  Your health care provider may ask you questions about your: Alcohol use. Tobacco use. Drug use. Emotional well-being. Home and relationship well-being. Sexual activity. Eating habits. History of falls. Memory and ability to understand (cognition). Work and work astronomer. Screening  You may have the following tests or measurements: Height, weight, and BMI. Blood pressure. Lipid and cholesterol levels. These  may be checked every 5 years, or more frequently if you are over 29 years old. Skin check. Lung cancer screening. You may have this screening every year starting at age 81 if you have a 30-pack-year history of smoking and currently smoke or have quit within the past 15 years. Fecal occult blood test (FOBT) of the stool. You may have this test every year starting at age 86. Flexible sigmoidoscopy or colonoscopy. You may have a sigmoidoscopy every 5 years or a colonoscopy every 10 years starting at age 67. Prostate cancer screening. Recommendations will vary depending on your family history and other risks. Hepatitis C blood test. Hepatitis B blood test. Sexually transmitted disease (STD) testing. Diabetes screening. This is done by checking your blood sugar (glucose) after you have not eaten for a while (fasting). You may have this done every 1-3 years. Abdominal aortic aneurysm (AAA) screening. You may need this if you are a current or former smoker. Osteoporosis. You may be screened starting at age 36 if you are at high risk. Talk with your health care provider about your test results, treatment options, and if necessary, the need for more tests. Vaccines  Your health care provider may recommend certain vaccines, such as: Influenza vaccine. This is recommended every year. Tetanus, diphtheria, and acellular pertussis (Tdap, Td) vaccine. You may need a Td booster every 10 years. Zoster vaccine. You may need this after age 28. Pneumococcal 13-valent conjugate (PCV13) vaccine. One dose is recommended after age 51. Pneumococcal polysaccharide (PPSV23) vaccine. One dose is recommended after age 3. Talk to your health care provider about which screenings and vaccines you need and how often you need them. This information is not intended to replace advice given to you by your health  care provider. Make sure you discuss any questions you have with your health care provider. Document Released:  05/29/2015 Document Revised: 01/20/2016 Document Reviewed: 03/03/2015 Elsevier Interactive Patient Education  2017 [employer].  Fall Prevention in the Home Falls can cause injuries. They can happen to people of all ages. There are many things you can do to make your home safe and to help prevent falls. What can I do on the outside of my home? Regularly fix the edges of walkways and driveways and fix any cracks. Remove anything that might make you trip as you walk through a door, such as a raised step or threshold. Trim any bushes or trees on the path to your home. Use bright outdoor lighting. Clear any walking paths of anything that might make someone trip, such as rocks or tools. Regularly check to see if handrails are loose or broken. Make sure that both sides of any steps have handrails. Any raised decks and porches should have guardrails on the edges. Have any leaves, snow, or ice cleared regularly. Use sand or salt on walking paths during winter. Clean up any spills in your garage right away. This includes oil or grease spills. What can I do in the bathroom? Use night lights. Install grab bars by the toilet and in the tub and shower. Do not use towel bars as grab bars. Use non-skid mats or decals in the tub or shower. If you need to sit down in the shower, use a plastic, non-slip stool. Keep the floor dry. Clean up any water  that spills on the floor as soon as it happens. Remove soap buildup in the tub or shower regularly. Attach bath mats securely with double-sided non-slip rug tape. Do not have throw rugs and other things on the floor that can make you trip. What can I do in the bedroom? Use night lights. Make sure that you have a light by your bed that is easy to reach. Do not use any sheets or blankets that are too big for your bed. They should not hang down onto the floor. Have a firm chair that has side arms. You can use this for support while you get dressed. Do not have  throw rugs and other things on the floor that can make you trip. What can I do in the kitchen? Clean up any spills right away. Avoid walking on wet floors. Keep items that you use a lot in easy-to-reach places. If you need to reach something above you, use a strong step stool that has a grab bar. Keep electrical cords out of the way. Do not use floor polish or wax that makes floors slippery. If you must use wax, use non-skid floor wax. Do not have throw rugs and other things on the floor that can make you trip. What can I do with my stairs? Do not leave any items on the stairs. Make sure that there are handrails on both sides of the stairs and use them. Fix handrails that are broken or loose. Make sure that handrails are as long as the stairways. Check any carpeting to make sure that it is firmly attached to the stairs. Fix any carpet that is loose or worn. Avoid having throw rugs at the top or bottom of the stairs. If you do have throw rugs, attach them to the floor with carpet tape. Make sure that you have a light switch at the top of the stairs and the bottom of the stairs. If you do not  have them, ask someone to add them for you. What else can I do to help prevent falls? Wear shoes that: Do not have high heels. Have rubber bottoms. Are comfortable and fit you well. Are closed at the toe. Do not wear sandals. If you use a stepladder: Make sure that it is fully opened. Do not climb a closed stepladder. Make sure that both sides of the stepladder are locked into place. Ask someone to hold it for you, if possible. Clearly mark and make sure that you can see: Any grab bars or handrails. First and last steps. Where the edge of each step is. Use tools that help you move around (mobility aids) if they are needed. These include: Canes. Walkers. Scooters. Crutches. Turn on the lights when you go into a dark area. Replace any light bulbs as soon as they burn out. Set up your furniture so  you have a clear path. Avoid moving your furniture around. If any of your floors are uneven, fix them. If there are any pets around you, be aware of where they are. Review your medicines with your doctor. Some medicines can make you feel dizzy. This can increase your chance of falling. Ask your doctor what other things that you can do to help prevent falls. This information is not intended to replace advice given to you by your health care provider. Make sure you discuss any questions you have with your health care provider. Document Released: 02/26/2009 Document Revised: 10/08/2015 Document Reviewed: 06/06/2014 Elsevier Interactive Patient Education  2017 [employer].

## 2024-03-12 NOTE — Progress Notes (Signed)
 Subjective:   Nicholas Cooke is a 73 y.o. male who presents for Medicare Annual/Subsequent preventive examination.  Visit Complete: Virtual I connected with  Nicholas Cooke on 03/12/24 by a audio enabled telemedicine application and verified that I am speaking with the correct person using two identifiers.  Patient Location: Home  Provider Location: Home Office  I discussed the limitations of evaluation and management by telemedicine. The patient expressed understanding and agreed to proceed.  Vital Signs: Because this visit was a virtual/telehealth visit, some criteria may be missing or patient reported. Any vitals not documented were not able to be obtained and vitals that have been documented are patient reported.  Patient Medicare AWV questionnaire was completed by the patient on 03-10-2024; I have confirmed that all information answered by patient is correct and no changes since this date.  Cardiac Risk Factors include: advanced age (>65men, >93 women);hypertension;male gender;family history of premature cardiovascular disease;obesity (BMI >30kg/m2)     Objective:    Today's Vitals   03/12/24 1352  Weight: 184 lb (83.5 kg)  Height: 5' 9 (1.753 m)  PainSc: 4    Body mass index is 27.17 kg/m.     03/12/2024    1:53 PM 02/29/2024    4:18 PM 02/28/2024    9:53 PM 01/11/2024    8:06 AM 09/25/2023   11:13 AM 06/27/2023    2:08 PM 07/12/2022    1:43 PM  Advanced Directives  Does Patient Have a Medical Advance Directive? No  No No;Yes No No No  Type of Theme Park Manager;Living will     Does patient want to make changes to medical advance directive?    No - Patient declined     Copy of Healthcare Power of Attorney in Chart?    No - copy requested     Would patient like information on creating a medical advance directive?  No - Patient declined   No - Patient declined No - Patient declined Yes (MAU/Ambulatory/Procedural Areas - Information given)     Current Medications (verified) Outpatient Encounter Medications as of 03/12/2024  Medication Sig   aspirin  EC 81 MG tablet Take 1 tablet (81 mg total) by mouth daily. Swallow whole.   ciprofloxacin (CIPRO) 500 MG tablet Take 1 tablet (500 mg total) by mouth 2 (two) times daily for 10 days.   escitalopram  (LEXAPRO ) 20 MG tablet Take 1 tablet (20 mg total) by mouth daily.   gabapentin  (NEURONTIN ) 300 MG capsule Take 300 mg by mouth 2 (two) times daily.   losartan  (COZAAR ) 25 MG tablet Take 1.5 tablets (37.5 mg total) by mouth daily.   melatonin 5 MG TABS Take 10 mg by mouth at bedtime as needed.   metFORMIN  (GLUCOPHAGE -XR) 500 MG 24 hr tablet Take 1 tablet (500 mg total) by mouth daily with breakfast.   metroNIDAZOLE (FLAGYL) 500 MG tablet Take 1 tablet (500 mg total) by mouth 3 (three) times daily for 10 days.   rosuvastatin  (CRESTOR ) 10 MG tablet Take 1 tablet (10 mg total) by mouth daily.   tadalafil (CIALIS) 5 MG tablet Take 5 mg by mouth daily as needed for erectile dysfunction.   traMADol  (ULTRAM ) 50 MG tablet Take 1 tablet (50 mg total) by mouth 2 (two) times daily as needed. Resume after completing or once no longer needing oxycodone .   traZODone  (DESYREL ) 100 MG tablet Take 1 tablet (100 mg total) by mouth at bedtime.   No facility-administered encounter medications on file  as of 03/12/2024.    Allergies (verified) Patient has no known allergies.   History: Past Medical History:  Diagnosis Date   Chronic kidney disease    Dental crowns present    Depression    Enlarged prostate    Hypertension    states under control with med., has been on med. x 2 yr.   Inguinal hernia 12/2015   Osteoarthritis    Past Surgical History:  Procedure Laterality Date   APPENDECTOMY  yrs ago   CYSTOSCOPY WITH INSERTION OF UROLIFT N/A 07/03/2018   Procedure: CYSTOSCOPY WITH INSERTION OF UROLIFT;  Surgeon: Nieves Cough, MD;  Location: [facility];  Service: Urology;   Laterality: N/A;  ONLY NEEDS 30 MIN FOR PROCEDURE   HERNIA REPAIR  2014   INGUINAL HERNIA REPAIR  02/16/2012   Procedure: HERNIA REPAIR INGUINAL ADULT;  Surgeon: Vicenta DELENA Poli, MD;  Location: [facility];  Service: General;  Laterality: Left;  left inguinal hernia repair with mesh   INGUINAL HERNIA REPAIR Bilateral 12/23/2015   Procedure: LAPAROSCOPIC LEFT INGUINAL HERNIA WITH MESH;  Surgeon: Vicenta Poli, MD;  Location: [facility];  Service: General;  Laterality: Bilateral;   INSERTION OF MESH Bilateral 12/23/2015   Procedure: INSERTION OF MESH;  Surgeon: Vicenta Poli, MD;  Location: [facility];  Service: General;  Laterality: Bilateral;   JOINT REPLACEMENT     KNEE ARTHROSCOPY WITH MEDIAL MENISECTOMY Right 10/18/2013   Procedure: RIGHT KNEE ARTHROSCOPY WITH PARTIAL MEDIAL MENISECTOMY;  Surgeon: Reyes JAYSON Billing, MD;  Location: [facility];  Service: Orthopedics;  Laterality: Right;   SHOULDER ARTHROSCOPY WITH ROTATOR CUFF REPAIR Left 2001   TOTAL KNEE ARTHROPLASTY Right 05/14/2015   Procedure: REMOVAL OF HARDWARE AND RIGHT TOTAL KNEE ARTHROPLASTY;  Surgeon: Reyes Billing, MD;  Location: [facility];  Service: Orthopedics;  Laterality: Right;   ULNAR NERVE REPAIR Right 1996   Family History  Problem Relation Age of Onset   Arthritis Mother    Stroke Father    Early death Sister    Social History   Socioeconomic History   Marital status: Married    Spouse name: Not on file   Number of children: Not on file   Years of education: Not on file   Highest education level: Associate degree: occupational, scientist, product/process development, or vocational program  Occupational History   Occupation: retired  Tobacco Use   Smoking status: Never   Smokeless tobacco: Never  Vaping Use   Vaping status: Never Used  Substance and Sexual Activity   Alcohol use: No   Drug use: No   Sexual activity: Not Currently    Birth control/protection: None  Other Topics Concern    Not on file  Social History Narrative   Right handed   Drinks caffeine   Two story home   Married   Retired from working for [employer]    Social Drivers of [employer]: Low Risk  (03/12/2024)   Overall Financial Resource Strain (CARDIA)    Difficulty of Paying Living Expenses: Not hard at all  Food Insecurity: No Food Insecurity (03/12/2024)   Hunger Vital Sign    Worried About Running Out of Food in the Last Year: Never true    Ran Out of Food in the Last Year: Never true  Transportation Needs: No Transportation Needs (03/12/2024)   PRAPARE - Administrator, Civil Service (Medical): No    Lack of Transportation (Non-Medical): No  Physical Activity: Insufficiently Active (03/12/2024)   Exercise Vital Sign    Days of Exercise per Week: 2 days    Minutes of Exercise per Session: 20 min  Stress: No Stress Concern Present (03/12/2024)   [employer] of Occupational Health - Occupational Stress Questionnaire    Feeling of Stress: Only a little  Social Connections: Moderately Integrated (03/12/2024)   Social Connection and Isolation Panel    Frequency of Communication with Friends and Family: More than three times a week    Frequency of Social Gatherings with Friends and Family: Patient declined    Attends Religious Services: Patient declined    Database Administrator or Organizations: No    Attends Engineer, Structural: 1 to 4 times per year    Marital Status: Married  Recent Concern: Social Connections - Moderately Isolated (03/10/2024)   Social Connection and Isolation Panel    Frequency of Communication with Friends and Family: More than three times a week    Frequency of Social Gatherings with Friends and Family: Patient declined    Attends Religious Services: Patient declined    Database Administrator or Organizations: No    Attends Engineer, Structural: Not on file    Marital Status: Married     Tobacco Counseling Counseling given: Not Answered   Clinical Intake:  Pre-visit preparation completed: Yes  Pain : 0-10 Pain Score: 4  Pain Type: Chronic pain Pain Location: Back Pain Descriptors / Indicators: Burning, Aching, Discomfort Pain Onset: More than a month ago Pain Frequency: Intermittent     Diabetes: No  How often do you need to have someone help you when you read instructions, pamphlets, or other written materials from your doctor or pharmacy?: 1 - Never  Interpreter Needed?: No  Information entered by :: Mliss Graff LPN   Activities of Daily Living    03/12/2024    1:55 PM 03/10/2024    3:15 PM  In your present state of health, do you have any difficulty performing the following activities:  Hearing? 0 0  Vision? 0 0  Difficulty concentrating or making decisions? 0 0  Walking or climbing stairs? 0 0  Dressing or bathing? 0 0  Doing errands, shopping? 0 0  Preparing Food and eating ? N N  Using the Toilet? N N  In the past six months, have you accidently leaked urine? N N  Do you have problems with loss of bowel control? N N  Managing your Medications? N N  Managing your Finances? N N  Housekeeping or managing your Housekeeping? N N    Patient Care Team: Knute Thersia Bitters, FNP as PCP - General (Family Medicine) Georjean Darice HERO, MD as Consulting Physician (Neurology) [facility], [facility]  Indicate any recent Medical Services you may have received from other than [facility] providers in the past year (date may be approximate).     Assessment:   This is a routine wellness examination for [place].  Hearing/Vision screen Hearing Screening - Comments:: No trouble hearing Vision Screening - Comments:: Up to date Vision works   Goals Addressed             This Visit's Progress    Remain active and independent   On track    Weight (lb) < 200 lb (90.7 kg)   184 lb (83.5 kg)      Depression Screen    03/12/2024    1:56  PM 06/30/2023   10:31 AM 05/19/2023  10:14 AM 02/14/2023    1:45 PM 11/24/2022    8:30 AM 07/12/2022    1:42 PM 05/17/2022    9:49 AM  PHQ 2/9 Scores  PHQ - 2 Score 3 1 1 1 3  0 3  PHQ- 9 Score 3 5 6 3 10  5     Fall Risk    03/12/2024    2:09 PM 03/10/2024    3:15 PM 06/30/2023   10:31 AM 05/19/2023   10:14 AM 02/14/2023    1:45 PM  Fall Risk   Falls in the past year? 1 1 1  0 0  Number falls in past yr: 1 1 1  0 0  Injury with Fall? 1 0 1 0 0  Risk for fall due to :   Impaired balance/gait;Impaired mobility No Fall Risks No Fall Risks  Follow up Falls evaluation completed;Education provided;Falls prevention discussed  Falls evaluation completed Falls evaluation completed Falls evaluation completed    MEDICARE RISK AT HOME: Medicare Risk at Home Any stairs in or around the home?: Yes If so, are there any without handrails?: No Home free of loose throw rugs in walkways, pet beds, electrical cords, etc?: Yes Adequate lighting in your home to reduce risk of falls?: Yes Life alert?: No Use of a cane, walker or w/c?: No Grab bars in the bathroom?: No Shower chair or bench in shower?: Yes Elevated toilet seat or a handicapped toilet?: No  TIMED UP AND GO:  Was the test performed?  No    Cognitive Function:        03/12/2024    1:54 PM 07/12/2022    1:44 PM  6CIT Screen  What Year? 0 points 0 points  What month? 0 points 0 points  What time? 0 points 0 points  Count back from 20 0 points 0 points  Months in reverse 0 points 0 points  Repeat phrase 2 points 0 points  Total Score 2 points 0 points    Immunizations Immunization History  Administered Date(s) Administered   Hepatitis A, Ped/Adol-2 Dose 12/25/2021, 06/25/2022   Hepatitis B, PED/ADOLESCENT 12/25/2021, 06/25/2022   INFLUENZA, HIGH DOSE SEASONAL PF 02/28/2022, 02/10/2023   PFIZER(Purple Top)SARS-COV-2 Vaccination 07/29/2019, 08/20/2019, 03/05/2020, 08/31/2020, 03/10/2021, 02/10/2023   PNEUMOCOCCAL CONJUGATE-20  06/25/2022   RSV,unspecified 03/29/2022   Tdap 12/25/2021   Zoster Recombinant(Shingrix) 12/25/2021, 06/25/2022    TDAP status: Up to date  Flu Vaccine status: Up to date  Pneumococcal vaccine status: Up to date  Covid-19 vaccine status: Information provided on how to obtain vaccines.   Qualifies for Shingles Vaccine? No   Zostavax completed Yes   Shingrix Completed?: Yes  Screening Tests Health Maintenance  Topic Date Due   Hepatitis C Screening  06/29/2024 (Originally 04/13/1969)   COVID-19 Vaccine (8 - Pfizer risk 2025-26 season) 08/01/2024   Medicare Annual Wellness (AWV)  03/12/2025   Colonoscopy  05/15/2031   Pneumococcal Vaccine: 50+ Years  Completed   Influenza Vaccine  Completed   Zoster Vaccines- Shingrix  Completed   Meningococcal B Vaccine  Aged Out   DTaP/Tdap/Td  Discontinued   Hepatitis B Vaccines 19-59 Average Risk  Discontinued    Health Maintenance  There are no preventive care reminders to display for this patient.   Colorectal cancer screening: Type of screening: Colonoscopy. Completed 2023. Repeat every 5 years  Lung Cancer Screening: (Low Dose CT Chest recommended if Age 67-80 years, 20 pack-year currently smoking OR have quit w/in 15years.) does not qualify.   Lung Cancer Screening Referral:  Additional Screening:  Hepatitis C Screening  never done  Vision Screening: Recommended annual ophthalmology exams for early detection of glaucoma and other disorders of the eye. Is the patient up to date with their annual eye exam?  Yes  Who is the provider or what is the name of the office in which the patient attends annual eye exams? [facility] If pt is not established with a provider, would they like to be referred to a provider to establish care? No .   Dental Screening: Recommended annual dental exams for proper oral hygiene    Community Resource Referral / Chronic Care Management: CRR required this visit?  No   CCM required this visit?   No     Plan:     I have personally reviewed and noted the following in the patient's chart:   Medical and social history Use of alcohol, tobacco or illicit drugs  Current medications and supplements including opioid prescriptions. Patient is not currently taking opioid prescriptions. Functional ability and status Nutritional status Physical activity Advanced directives List of other physicians Hospitalizations, surgeries, and ER visits in previous 12 months Vitals Screenings to include cognitive, depression, and falls Referrals and appointments  In addition, I have reviewed and discussed with patient certain preventive protocols, quality metrics, and best practice recommendations. A written personalized care plan for preventive services as well as general preventive health recommendations were provided to patient.     Mliss Graff, LPN   [id number]   After Visit Summary: (MyChart) Due to this being a telephonic visit, the after visit summary with patients personalized plan was offered to patient via MyChart

## 2024-03-13 DIAGNOSIS — Z79891 Long term (current) use of opiate analgesic: Secondary | ICD-10-CM | POA: Diagnosis not present

## 2024-03-13 DIAGNOSIS — M160 Bilateral primary osteoarthritis of hip: Secondary | ICD-10-CM | POA: Diagnosis not present

## 2024-03-13 DIAGNOSIS — M545 Low back pain, unspecified: Secondary | ICD-10-CM | POA: Diagnosis not present

## 2024-03-13 DIAGNOSIS — G8929 Other chronic pain: Secondary | ICD-10-CM | POA: Diagnosis not present

## 2024-03-18 ENCOUNTER — Telehealth (HOSPITAL_BASED_OUTPATIENT_CLINIC_OR_DEPARTMENT_OTHER): Payer: Self-pay | Admitting: Family Medicine

## 2024-03-18 NOTE — Telephone Encounter (Signed)
 Received a telephone access record from the nurse line on the fax from the weekend.  Patient called in stating his blood pressure was up and he had been in the hospital last Saturday do we need to schedule a HFU for patient? Please advise.

## 2024-03-18 NOTE — Telephone Encounter (Signed)
 Pt was hospitalized 10/15. Seen 10/23 for hospital follow up.

## 2024-03-20 ENCOUNTER — Other Ambulatory Visit (HOSPITAL_BASED_OUTPATIENT_CLINIC_OR_DEPARTMENT_OTHER): Payer: Self-pay | Admitting: Family Medicine

## 2024-03-28 ENCOUNTER — Encounter (INDEPENDENT_AMBULATORY_CARE_PROVIDER_SITE_OTHER): Payer: Self-pay | Admitting: Family Medicine

## 2024-03-28 ENCOUNTER — Other Ambulatory Visit: Payer: Self-pay | Admitting: Family Medicine

## 2024-03-28 DIAGNOSIS — I10 Essential (primary) hypertension: Secondary | ICD-10-CM | POA: Diagnosis not present

## 2024-03-29 NOTE — Telephone Encounter (Signed)
 Please see mychart message sent by pt and advise.

## 2024-04-01 ENCOUNTER — Other Ambulatory Visit: Payer: Self-pay | Admitting: Family Medicine

## 2024-04-01 MED ORDER — AMLODIPINE BESYLATE 5 MG PO TABS: 5.0000 mg | ORAL_TABLET | Freq: Every day | ORAL | 3 refills | Status: AC

## 2024-04-01 NOTE — Telephone Encounter (Signed)
 Please see the MyChart message reply(ies) for my assessment and plan.    This patient gave consent for this Medical Advice Message and is aware that it may result in a bill to [employer], as well as the possibility of receiving a bill for a co-payment or deductible. They are an established patient, but are not seeking medical advice exclusively about a problem treated during an in person or video visit in the last seven days. I did not recommend an in person or video visit within seven days of my reply.    I spent a total of 7 minutes cumulative time within 7 days through [employer].  Thersia Schuyler Stark, [place]

## 2024-04-03 ENCOUNTER — Other Ambulatory Visit (HOSPITAL_BASED_OUTPATIENT_CLINIC_OR_DEPARTMENT_OTHER): Payer: Self-pay | Admitting: Family Medicine

## 2024-04-03 NOTE — Telephone Encounter (Unsigned)
 Copied from CRM #[id number]. Topic: Clinical - Medication Refill >> Apr 03, 2024  3:25 PM Jasmin G wrote: Medication: losartan  (COZAAR ) 25 MG tablet  Has the patient contacted their pharmacy? Yes (Agent: If no, request that the patient contact the pharmacy for the refill. If patient does not wish to contact the pharmacy document the reason why and proceed with request.) (Agent: If yes, when and what did the pharmacy advise?)  This is the patient's preferred pharmacy:  [facility] - [place], [place] - [address] [place] [address] Phone: [phone] Fax: [phone]  Is this the correct pharmacy for this prescription? Yes If no, delete pharmacy and type the correct one.   Has the prescription been filled recently? Yes  Is the patient out of the medication? No  Has the patient been seen for an appointment in the last year OR does the patient have an upcoming appointment? Yes  Can we respond through MyChart? No  Agent: Please be advised that Rx refills may take up to 3 business days. We ask that you follow-up with your pharmacy.

## 2024-04-04 MED ORDER — LOSARTAN POTASSIUM 25 MG PO TABS: 37.5000 mg | ORAL_TABLET | Freq: Every day | ORAL | 2 refills | Status: AC

## 2024-04-04 NOTE — Telephone Encounter (Signed)
 Trying to calculate correct quantity of pills to make up the 30-day supply with pt being on 37.5mg .  Routing to [facility] for assistance.

## 2024-04-22 ENCOUNTER — Ambulatory Visit (INDEPENDENT_AMBULATORY_CARE_PROVIDER_SITE_OTHER): Admitting: Family Medicine

## 2024-04-22 VITALS — BP 118/81 | HR 75 | Resp 18 | Ht 69.0 in | Wt 184.0 lb

## 2024-04-22 DIAGNOSIS — I10 Essential (primary) hypertension: Secondary | ICD-10-CM | POA: Diagnosis not present

## 2024-04-22 NOTE — Progress Notes (Signed)
 Pt denies CP, SOB, dizziness, or heart palpitations. taking meds as directed without problems. Denies med side effects. 5 min spent with pt.

## 2024-06-07 ENCOUNTER — Encounter (HOSPITAL_BASED_OUTPATIENT_CLINIC_OR_DEPARTMENT_OTHER): Payer: Self-pay | Admitting: Family Medicine

## 2024-06-07 ENCOUNTER — Ambulatory Visit (INDEPENDENT_AMBULATORY_CARE_PROVIDER_SITE_OTHER): Admitting: Family Medicine

## 2024-06-07 VITALS — BP 131/82 | HR 77 | Temp 98.2°F | Resp 18 | Ht 69.0 in | Wt 185.0 lb

## 2024-06-07 DIAGNOSIS — N1832 Chronic kidney disease, stage 3b: Secondary | ICD-10-CM | POA: Diagnosis not present

## 2024-06-07 DIAGNOSIS — R7303 Prediabetes: Secondary | ICD-10-CM | POA: Diagnosis not present

## 2024-06-07 DIAGNOSIS — R42 Dizziness and giddiness: Secondary | ICD-10-CM | POA: Diagnosis not present

## 2024-06-07 DIAGNOSIS — M6740 Ganglion, unspecified site: Secondary | ICD-10-CM

## 2024-06-07 DIAGNOSIS — L03011 Cellulitis of right finger: Secondary | ICD-10-CM | POA: Diagnosis not present

## 2024-06-07 DIAGNOSIS — I10 Essential (primary) hypertension: Secondary | ICD-10-CM | POA: Diagnosis not present

## 2024-06-07 LAB — BASIC METABOLIC PANEL WITH GFR
BUN/Creatinine Ratio: 8 — ABNORMAL LOW (ref 10–24)
BUN: 11 mg/dL (ref 8–27)
CO2: 18 mmol/L — ABNORMAL LOW (ref 20–29)
Calcium: 9.4 mg/dL (ref 8.6–10.2)
Chloride: 105 mmol/L (ref 96–106)
Creatinine, Ser: 1.34 mg/dL — ABNORMAL HIGH (ref 0.76–1.27)
Glucose: 95 mg/dL (ref 70–99)
Potassium: 4.7 mmol/L (ref 3.5–5.2)
Sodium: 140 mmol/L (ref 134–144)
eGFR: 57 mL/min/1.73 — ABNORMAL LOW

## 2024-06-07 LAB — CBC WITH DIFFERENTIAL/PLATELET
Basophils Absolute: 0 x10E3/uL (ref 0.0–0.2)
Basos: 0 %
EOS (ABSOLUTE): 0.1 x10E3/uL (ref 0.0–0.4)
Eos: 1 %
Hematocrit: 47.1 % (ref 37.5–51.0)
Hemoglobin: 16.2 g/dL (ref 13.0–17.7)
Immature Grans (Abs): 0 x10E3/uL (ref 0.0–0.1)
Immature Granulocytes: 0 %
Lymphocytes Absolute: 3.1 x10E3/uL (ref 0.7–3.1)
Lymphs: 44 %
MCH: 29.6 pg (ref 26.6–33.0)
MCHC: 34.4 g/dL (ref 31.5–35.7)
MCV: 86 fL (ref 79–97)
Monocytes Absolute: 0.6 x10E3/uL (ref 0.1–0.9)
Monocytes: 8 %
Neutrophils Absolute: 3.2 x10E3/uL (ref 1.4–7.0)
Neutrophils: 47 %
Platelets: 204 x10E3/uL (ref 150–450)
RBC: 5.48 x10E6/uL (ref 4.14–5.80)
RDW: 13.8 % (ref 11.6–15.4)
WBC: 6.9 x10E3/uL (ref 3.4–10.8)

## 2024-06-07 MED ORDER — DOXYCYCLINE HYCLATE 100 MG PO TABS: 100.0000 mg | ORAL_TABLET | Freq: Two times a day (BID) | ORAL | 0 refills | Status: AC

## 2024-06-07 NOTE — Progress Notes (Signed)
 "    Subjective:   Nicholas Cooke December 20, 1950 06/07/2024  Chief Complaint  Patient presents with   Hypertension   Chronic Kidney Disease   Dizziness    Feels like the room was spinning. Started 06/04/24. 2 episodes this week.    Blister    Right thumb    Discussed the use of AI scribe software for clinical note transcription with the patient, who gave verbal consent to proceed.  History of Present Illness Nicholas Cooke is a 74 year old male with hypertension and kidney disease who presents for follow up.  He experienced two episodes of dizziness this week. The first episode occurred on Tuesday while turning around to get something off the desk, lasting about a minute. He describes the sensation as 'seeing my eyes going around' and feeling unsteady on his feet, necessitating him to feel for the bed to prevent falling. The second episode happened on Wednesday at a grocery store entrance, where he had to hold onto a cart for support until the dizziness subsided, which also lasted about a minute. No associated nausea, heart palpitations, or previous similar episodes. He notes usual tinnitus but no recent sinus infections or ear pressure changes.  His blood pressure has been stable at home, and he denies any issues with his medications, including metformin , which he takes daily with breakfast. No increased thirst, hunger, or urination. A home visit with medicare earlier this week showed an A1c of 5.1. He is up to date with his COVID and flu vaccinations.  He reports an issue with his hand that started two weeks ago, with no recollection of trauma, and notes it started after a thumb issue. Reports mild pain and redness to right thumb and new bumpsthat have appeared on his right wrist and right thumb. He reports no drainage. He mentions previous dehydration-related dizziness years ago but denies any recent dehydration. His fluid intake this week has not been great, as he finds drinking water  in  winter challenging.   The following portions of the patient's history were reviewed and updated as appropriate: past medical history, past surgical history, family history, social history, allergies, medications, and problem list.   Patient Active Problem List   Diagnosis Date Noted   Uncontrolled daytime somnolence 03/08/2024   Acute diverticulitis 02/29/2024   Lumbar radiculopathy 02/29/2024   AKI (acute kidney injury) 02/29/2024   Hyponatremia 02/29/2024   Metabolic acidosis 02/29/2024   Hypotension 02/29/2024   Lumbosacral spondylosis with radiculopathy 06/30/2023   Other chronic pain 06/30/2023   Lumbosacral spondylosis without myelopathy 06/30/2023   Lymphocytosis 06/27/2023   Pre-syncope 06/27/2023   Prediabetes 05/19/2023   Acute bilateral low back pain without sciatica 11/24/2022   Benign prostatic hyperplasia without lower urinary tract symptoms 05/17/2022   Stage 3b chronic kidney disease (HCC) 05/17/2022   Diverticulosis 05/17/2022   Hyperlipidemia 05/17/2022   Depression, recurrent 05/17/2022   Amaurosis fugax 03/12/2021   History of diverticulitis 01/29/2021   Insomnia 12/28/2020   Essential hypertension 06/15/2020   Bilateral impacted cerumen 03/15/2018   Rhinitis 03/15/2018   Tinnitus aurium, 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 x 2 yr.   Inguinal hernia 12/2015  Osteoarthritis    Past Surgical History:  Procedure Laterality Date   APPENDECTOMY  yrs ago   CYSTOSCOPY WITH INSERTION OF UROLIFT N/A 07/03/2018   Procedure: CYSTOSCOPY WITH INSERTION OF UROLIFT;  Surgeon: Nieves Cough, MD;  Location: [facility];  Service: Urology;  Laterality: N/A;  ONLY  NEEDS 30 MIN FOR PROCEDURE   HERNIA REPAIR  2014   INGUINAL HERNIA REPAIR  02/16/2012   Procedure: HERNIA REPAIR INGUINAL ADULT;  Surgeon: Vicenta DELENA Poli, MD;  Location: [facility];  Service: General;  Laterality: Left;  left inguinal hernia repair with mesh   INGUINAL HERNIA REPAIR Bilateral 12/23/2015   Procedure: LAPAROSCOPIC LEFT INGUINAL HERNIA WITH MESH;  Surgeon: Vicenta Poli, MD;  Location: [facility];  Service: General;  Laterality: Bilateral;   INSERTION OF MESH Bilateral 12/23/2015   Procedure: INSERTION OF MESH;  Surgeon: Vicenta Poli, MD;  Location: [facility];  Service: General;  Laterality: Bilateral;   JOINT REPLACEMENT     KNEE ARTHROSCOPY WITH MEDIAL MENISECTOMY Right 10/18/2013   Procedure: RIGHT KNEE ARTHROSCOPY WITH PARTIAL MEDIAL MENISECTOMY;  Surgeon: Reyes JAYSON Billing, MD;  Location: [facility];  Service: Orthopedics;  Laterality: Right;   SHOULDER ARTHROSCOPY WITH ROTATOR CUFF REPAIR Left 2001   TOTAL KNEE ARTHROPLASTY Right 05/14/2015   Procedure: REMOVAL OF HARDWARE AND RIGHT TOTAL KNEE ARTHROPLASTY;  Surgeon: Reyes Billing, MD;  Location: [facility];  Service: Orthopedics;  Laterality: Right;   ULNAR NERVE REPAIR Right 1996   Family History  Problem Relation Age of Onset   Arthritis Mother    Stroke Father    Early death Sister    Outpatient Medications Prior to Visit  Medication Sig Dispense Refill   amLODipine  (NORVASC ) 5 MG tablet Take 1 tablet (5 mg total) by mouth daily. 30 tablet 3   aspirin  EC 81 MG tablet Take 1 tablet (81 mg total) by mouth daily. Swallow whole. 90 tablet 1   escitalopram  (LEXAPRO ) 20 MG tablet Take 1 tablet (20 mg total) by mouth daily. 100 tablet 3   gabapentin  (NEURONTIN ) 300 MG capsule Take 300 mg by mouth 2 (two) times daily.     losartan  (COZAAR ) 25 MG tablet Take 1.5 tablets (37.5 mg total) by mouth daily. 135 tablet 2   melatonin 5 MG TABS Take 10 mg by mouth at bedtime as needed.      metFORMIN  (GLUCOPHAGE -XR) 500 MG 24 hr tablet Take 1 tablet (500 mg total) by mouth daily with breakfast. 90 tablet 3   rosuvastatin  (CRESTOR ) 10 MG tablet Take 1 tablet (10 mg total) by mouth daily. 90 tablet 3   tadalafil (CIALIS) 5 MG tablet Take 5 mg by mouth daily as needed for erectile dysfunction.     traMADol  (ULTRAM ) 50 MG tablet Take 1 tablet (50 mg total) by mouth 2 (two) times daily as needed. Resume after completing or once no longer needing oxycodone . 30 tablet 0   traZODone  (DESYREL ) 100 MG tablet TAKE 1 TABLET BY MOUTH AT  BEDTIME 100 tablet 3   No facility-administered medications prior to visit.   Allergies[1]   ROS: A complete ROS was performed with pertinent positives/negatives noted in the HPI. The remainder of the ROS are negative.    Objective:   Today's Vitals   06/07/24 0846  BP: 131/82  Pulse: 77  Resp: 18  Temp: 98.2 F (36.8 C)  TempSrc: Oral  SpO2: 97%  Weight: 185 lb (83.9 kg)  Height: 5' 9 (1.753  m)  PainSc: 5   PainLoc: Back    Physical Exam   GENERAL: Well-appearing, in NAD. Well nourished.  SKIN: Pink, warm and dry. No rash, lesion, ulceration, or ecchymoses. Mild redness without drainage, paronychia, or deformity to right lateral aspect of nailbed of right thumb. Cyst formation present to right thumb metacarpal and posterior aspect of right wrist.  Head: Normocephalic. NECK: Trachea midline. Full ROM w/o pain or tenderness. No lymphadenopathy.  EARS: Tympanic membranes are intact, translucent without bulging and without drainage. Appropriate landmarks visualized.  EYES: Conjunctiva clear without exudates. EOMI, PERRL, no drainage present.  NOSE: Septum midline w/o deformity. Nares patent, mucosa pink and non-inflamed w/o drainage. No sinus tenderness.  RESPIRATORY: Chest wall symmetrical. Respirations even and non-labored. Breath sounds clear to auscultation bilaterally.  CARDIAC: S1, S2 present, regular rate and rhythm without murmur  or gallops. Peripheral pulses 2+ bilaterally.  MSK: Muscle tone and strength appropriate for age.  NEUROLOGIC: No motor or sensory deficits. Steady, even gait. C2-C12 intact. + dix hallpike testing with Mild nystagmus on positional testing. PSYCH/MENTAL STATUS: Alert, oriented x 3. Cooperative, appropriate mood and affect.      Assessment & Plan:  1. Vertigo (Primary) Positional vertigo present on exam.  Recommend Epley maneuvers at home, increased clear fluids, use of nasal steroids if needed for possible eustachian tube dysfunction contributing and meclizine offered, patient declined.  Follow-up in 1 to 2 weeks if no improvement or worsening  2. Essential hypertension Stable, continue current regimen.  3. Stage 3b chronic kidney disease (HCC) Stable previously.  Obtain CBC and BMP with lab work today.  Continue current regimen - Basic metabolic panel with GFR - CBC with Differential/Platelet  4. Prediabetes Managed with metformin , diet and exercise.  Patient had check of A1c at 5.1 with Medicare appointment at home this week.  5. Ganglion cyst Recommend further evaluation and aspiration with orthopedic surgery hand specialist.  Referral placed - Ambulatory referral to Orthopedic Surgery  6. Hangnail of digit of right hand Given redness, tenderness, possible infection present from hangnail to the right thumb.  Start doxycycline  twice daily x 7 days, recommended warm Epsom salt soaks, topical antibiotic ointment 2-3 times a day.  Follow-up with PCP if no improvement or worsening in 2 to 3 days.    Meds ordered this encounter  Medications   doxycycline  (VIBRA -TABS) 100 MG tablet    Sig: Take 1 tablet (100 mg total) by mouth 2 (two) times daily.    Dispense:  14 tablet    Refill:  0    Supervising Provider:   DE CUBA, RAYMOND J [[id number]]   Lab Orders         Basic metabolic panel with GFR         CBC with Differential/Platelet     No images are attached to the encounter or  orders placed in the encounter.  Return in about 4 months (around 10/05/2024) for ANNUAL PHYSICAL.    Patient to reach out to office if new, worrisome, or unresolved symptoms arise or if no improvement in patient's condition. Patient verbalized understanding and is agreeable to treatment plan. All questions answered to patient's satisfaction.    Thersia Schuyler Stark, FNP     [1] No Known Allergies  "

## 2024-06-07 NOTE — Patient Instructions (Signed)
 Vertigo:  Epley Maneuvers twice daily x 1-2 weeks.  Use Flonase once daily x 2 weeks.   Set up Ortho appt for ganglion cyst to wrist and finger.    Epsom Salt soaks of finger 2-3x per day and apply bacitracin  ointment 2-3x per day.

## 2024-06-08 ENCOUNTER — Other Ambulatory Visit (HOSPITAL_BASED_OUTPATIENT_CLINIC_OR_DEPARTMENT_OTHER): Payer: Self-pay | Admitting: Family Medicine

## 2024-06-09 ENCOUNTER — Ambulatory Visit (HOSPITAL_BASED_OUTPATIENT_CLINIC_OR_DEPARTMENT_OTHER): Payer: Self-pay | Admitting: Family Medicine

## 2024-06-09 NOTE — Progress Notes (Signed)
 Hi Nicholas Cooke,  Your kidney function and electrolytes are stable and your blood counts are normal. No further labs needed at this time.

## 2024-06-26 ENCOUNTER — Ambulatory Visit: Admitting: Orthopedic Surgery

## 2024-10-16 ENCOUNTER — Encounter (HOSPITAL_BASED_OUTPATIENT_CLINIC_OR_DEPARTMENT_OTHER): Admitting: Family Medicine

## 2025-04-08 ENCOUNTER — Ambulatory Visit (HOSPITAL_BASED_OUTPATIENT_CLINIC_OR_DEPARTMENT_OTHER)
